# Patient Record
Sex: Female | Born: 1945 | Race: Black or African American | Hispanic: No | Smoking: Current every day smoker
Health system: Southern US, Community
[De-identification: ages and names within clinical notes are randomized; demographics above are authoritative.]

## PROBLEM LIST (undated history)

## (undated) DIAGNOSIS — R413 Other amnesia: Secondary | ICD-10-CM

## (undated) DIAGNOSIS — E079 Disorder of thyroid, unspecified: Secondary | ICD-10-CM

## (undated) DIAGNOSIS — F32A Depression, unspecified: Secondary | ICD-10-CM

## (undated) DIAGNOSIS — E538 Deficiency of other specified B group vitamins: Secondary | ICD-10-CM

## (undated) DIAGNOSIS — E785 Hyperlipidemia, unspecified: Secondary | ICD-10-CM

## (undated) DIAGNOSIS — I10 Essential (primary) hypertension: Secondary | ICD-10-CM

## (undated) DIAGNOSIS — F329 Major depressive disorder, single episode, unspecified: Secondary | ICD-10-CM

## (undated) DIAGNOSIS — N3281 Overactive bladder: Secondary | ICD-10-CM

## (undated) DIAGNOSIS — E119 Type 2 diabetes mellitus without complications: Secondary | ICD-10-CM

## (undated) HISTORY — DX: Deficiency of other specified B group vitamins: E53.8

## (undated) HISTORY — DX: Overactive bladder: N32.81

## (undated) HISTORY — DX: Depression, unspecified: F32.A

## (undated) HISTORY — DX: Other amnesia: R41.3

## (undated) HISTORY — DX: Disorder of thyroid, unspecified: E07.9

## (undated) HISTORY — DX: Hyperlipidemia, unspecified: E78.5

## (undated) HISTORY — PX: ABDOMINAL HYSTERECTOMY: SHX81

---
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
## 2006-02-02 ENCOUNTER — Ambulatory Visit: Payer: Self-pay

## 2006-02-07 ENCOUNTER — Ambulatory Visit: Payer: Self-pay

## 2006-07-21 ENCOUNTER — Ambulatory Visit: Payer: Self-pay

## 2006-10-03 ENCOUNTER — Ambulatory Visit: Payer: Self-pay | Admitting: Family Medicine

## 2007-01-18 ENCOUNTER — Ambulatory Visit: Payer: Self-pay

## 2007-03-10 ENCOUNTER — Ambulatory Visit: Payer: Self-pay

## 2007-07-31 ENCOUNTER — Ambulatory Visit: Payer: Self-pay

## 2007-10-09 ENCOUNTER — Ambulatory Visit: Payer: Self-pay

## 2007-12-05 ENCOUNTER — Emergency Department: Payer: Self-pay | Admitting: Emergency Medicine

## 2008-02-05 ENCOUNTER — Ambulatory Visit: Payer: Self-pay

## 2008-02-06 ENCOUNTER — Ambulatory Visit: Payer: Self-pay

## 2008-03-06 ENCOUNTER — Ambulatory Visit: Payer: Self-pay | Admitting: General Surgery

## 2008-05-24 ENCOUNTER — Ambulatory Visit: Payer: Self-pay | Admitting: Otolaryngology

## 2008-08-29 ENCOUNTER — Ambulatory Visit: Payer: Self-pay

## 2008-12-20 DIAGNOSIS — I10 Essential (primary) hypertension: Secondary | ICD-10-CM | POA: Insufficient documentation

## 2008-12-20 DIAGNOSIS — E039 Hypothyroidism, unspecified: Secondary | ICD-10-CM | POA: Insufficient documentation

## 2011-07-29 DIAGNOSIS — E669 Obesity, unspecified: Secondary | ICD-10-CM | POA: Insufficient documentation

## 2011-07-29 DIAGNOSIS — E66812 Obesity, class 2: Secondary | ICD-10-CM | POA: Insufficient documentation

## 2012-02-07 DIAGNOSIS — B182 Chronic viral hepatitis C: Secondary | ICD-10-CM

## 2012-02-07 HISTORY — DX: Chronic viral hepatitis C: B18.2

## 2014-12-10 DIAGNOSIS — E785 Hyperlipidemia, unspecified: Secondary | ICD-10-CM | POA: Insufficient documentation

## 2015-12-12 DIAGNOSIS — F17200 Nicotine dependence, unspecified, uncomplicated: Secondary | ICD-10-CM | POA: Insufficient documentation

## 2016-04-03 DIAGNOSIS — E876 Hypokalemia: Secondary | ICD-10-CM

## 2016-04-03 DIAGNOSIS — R0902 Hypoxemia: Secondary | ICD-10-CM

## 2016-04-03 DIAGNOSIS — D72829 Elevated white blood cell count, unspecified: Secondary | ICD-10-CM | POA: Diagnosis not present

## 2016-04-03 DIAGNOSIS — J189 Pneumonia, unspecified organism: Secondary | ICD-10-CM

## 2016-04-03 DIAGNOSIS — Z720 Tobacco use: Secondary | ICD-10-CM

## 2016-04-04 DIAGNOSIS — D72829 Elevated white blood cell count, unspecified: Secondary | ICD-10-CM | POA: Diagnosis not present

## 2016-04-04 DIAGNOSIS — Z720 Tobacco use: Secondary | ICD-10-CM | POA: Diagnosis not present

## 2016-04-04 DIAGNOSIS — E876 Hypokalemia: Secondary | ICD-10-CM | POA: Diagnosis not present

## 2016-04-04 DIAGNOSIS — R0902 Hypoxemia: Secondary | ICD-10-CM | POA: Diagnosis not present

## 2016-04-05 DIAGNOSIS — R0902 Hypoxemia: Secondary | ICD-10-CM | POA: Diagnosis not present

## 2016-04-05 DIAGNOSIS — D72829 Elevated white blood cell count, unspecified: Secondary | ICD-10-CM | POA: Diagnosis not present

## 2016-04-05 DIAGNOSIS — E876 Hypokalemia: Secondary | ICD-10-CM | POA: Diagnosis not present

## 2016-04-05 DIAGNOSIS — Z720 Tobacco use: Secondary | ICD-10-CM | POA: Diagnosis not present

## 2016-04-06 DIAGNOSIS — E876 Hypokalemia: Secondary | ICD-10-CM | POA: Diagnosis not present

## 2016-04-06 DIAGNOSIS — Z720 Tobacco use: Secondary | ICD-10-CM | POA: Diagnosis not present

## 2016-04-06 DIAGNOSIS — R918 Other nonspecific abnormal finding of lung field: Secondary | ICD-10-CM

## 2016-04-06 DIAGNOSIS — R0902 Hypoxemia: Secondary | ICD-10-CM | POA: Diagnosis not present

## 2016-04-06 DIAGNOSIS — D72829 Elevated white blood cell count, unspecified: Secondary | ICD-10-CM | POA: Diagnosis not present

## 2016-04-06 DIAGNOSIS — I2699 Other pulmonary embolism without acute cor pulmonale: Secondary | ICD-10-CM

## 2016-04-07 DIAGNOSIS — Z720 Tobacco use: Secondary | ICD-10-CM | POA: Diagnosis not present

## 2016-04-07 DIAGNOSIS — E876 Hypokalemia: Secondary | ICD-10-CM | POA: Diagnosis not present

## 2016-04-07 DIAGNOSIS — J189 Pneumonia, unspecified organism: Secondary | ICD-10-CM

## 2016-04-07 DIAGNOSIS — I2699 Other pulmonary embolism without acute cor pulmonale: Secondary | ICD-10-CM

## 2016-04-07 DIAGNOSIS — R918 Other nonspecific abnormal finding of lung field: Secondary | ICD-10-CM

## 2016-04-07 DIAGNOSIS — R0902 Hypoxemia: Secondary | ICD-10-CM | POA: Diagnosis not present

## 2016-04-07 DIAGNOSIS — D72829 Elevated white blood cell count, unspecified: Secondary | ICD-10-CM | POA: Diagnosis not present

## 2016-04-08 DIAGNOSIS — R918 Other nonspecific abnormal finding of lung field: Secondary | ICD-10-CM | POA: Diagnosis not present

## 2016-04-08 DIAGNOSIS — E876 Hypokalemia: Secondary | ICD-10-CM | POA: Diagnosis not present

## 2016-04-08 DIAGNOSIS — J189 Pneumonia, unspecified organism: Secondary | ICD-10-CM | POA: Diagnosis not present

## 2016-04-08 DIAGNOSIS — R0902 Hypoxemia: Secondary | ICD-10-CM

## 2016-04-08 DIAGNOSIS — Z720 Tobacco use: Secondary | ICD-10-CM | POA: Diagnosis not present

## 2016-04-08 DIAGNOSIS — I2699 Other pulmonary embolism without acute cor pulmonale: Secondary | ICD-10-CM | POA: Diagnosis not present

## 2016-04-08 DIAGNOSIS — D72829 Elevated white blood cell count, unspecified: Secondary | ICD-10-CM | POA: Diagnosis not present

## 2018-11-22 DIAGNOSIS — K219 Gastro-esophageal reflux disease without esophagitis: Secondary | ICD-10-CM | POA: Insufficient documentation

## 2019-03-31 ENCOUNTER — Ambulatory Visit: Payer: Medicare Other

## 2019-03-31 ENCOUNTER — Ambulatory Visit
Admission: EM | Admit: 2019-03-31 | Discharge: 2019-03-31 | Disposition: A | Discharge status: Home / Self Care | Payer: Medicare Other | Attending: Diagnostic Radiology | Admitting: Diagnostic Radiology

## 2019-03-31 ENCOUNTER — Other Ambulatory Visit: Payer: Self-pay

## 2019-03-31 ENCOUNTER — Encounter: Payer: Self-pay | Admitting: Emergency Medicine

## 2019-03-31 DIAGNOSIS — S20212A Contusion of left front wall of thorax, initial encounter: Secondary | ICD-10-CM | POA: Diagnosis not present

## 2019-03-31 DIAGNOSIS — W19XXXA Unspecified fall, initial encounter: Secondary | ICD-10-CM | POA: Diagnosis not present

## 2019-03-31 DIAGNOSIS — S63502A Unspecified sprain of left wrist, initial encounter: Secondary | ICD-10-CM | POA: Insufficient documentation

## 2019-03-31 HISTORY — DX: Type 2 diabetes mellitus without complications: E11.9

## 2019-03-31 HISTORY — DX: Essential (primary) hypertension: I10

## 2019-03-31 NOTE — ED Triage Notes (Signed)
Pt c/o left wrist pain and left sided rib pain. She states that she fell about 2 days ago. She fell on a box. She states that she just lost her balance and fell.

## 2019-03-31 NOTE — Discharge Instructions (Addendum)
Take Tylenol 500 mg every 6h for pain as needed Follow with orthopedic through [facility] on Monday, call them or just go there since they are an urgent care clinic.

## 2019-03-31 NOTE — ED Provider Notes (Signed)
[facility]    CSN: [id number] Arrival date & time: 03/31/19  1129      History   Chief Complaint Chief Complaint  Patient presents with  . Fall    HPI Katherine Stevens is a 73 y.o. female. who presents with L wrist pain and L anterior rL rib pain after tripping and falling forward and a wood box hit her L rib area anteriorly. She fell forward. No LOC. Pain is located on medial L wrist and is worse with palpation, some movements and cant lean on it. Ribs are tender with deep breaths and palpation. States she needs to find a Dr In [place] since [facility] go [facility]. Her niece who is here with her would also like pt referred for memory evaluation. She does not know how pt's insurance works and pt does not have a new PCP.     Past Medical History:  Diagnosis Date  . Diabetes mellitus without complication (HCC)   . Hypertension     There are no problems to display for this patient.   Past Surgical History:  Procedure Laterality Date  . ABDOMINAL HYSTERECTOMY      OB History   No obstetric history on file.      Home Medications    Prior to Admission medications   Medication Sig Start Date End Date Taking? Authorizing Provider  atorvastatin (LIPITOR) 40 MG tablet Take by mouth. 11/23/18 11/23/19 Yes [provider]  buPROPion (WELLBUTRIN) 75 MG tablet Take by mouth. 11/23/18 11/23/19 Yes [provider]  citalopram (CELEXA) 10 MG tablet Take by mouth. 11/23/18 11/23/19 Yes [provider]  hydrochlorothiazide (HYDRODIURIL) 25 MG tablet Take 25 mg by mouth daily.   Yes [provider]  levothyroxine (SYNTHROID) 75 MCG tablet Take by mouth. 03/13/19  Yes [provider]  loratadine (CLARITIN) 10 MG tablet Take by mouth. 11/23/18  Yes [provider]  losartan (COZAAR) 50 MG tablet Take by mouth. 11/23/18 11/23/19 Yes [provider]  metFORMIN (GLUCOPHAGE) 500 MG tablet Take by mouth.  11/23/18  Yes [provider]  oxybutynin (DITROPAN-XL) 5 MG 24 hr tablet Take by mouth. 11/23/18 11/23/19 Yes [provider]    Family History History reviewed. No pertinent family history.  Social History Social History   Tobacco Use  . Smoking status: Current Every Day Smoker    Packs/day: 0.25  . Smokeless tobacco: Never Used  Substance Use Topics  . Alcohol use: Not Currently  . Drug use: Not Currently     Allergies   Lisinopril   Review of Systems Review of Systems + L anterior L rib pain, L medial wrist pain, denies CP, SOB, edema, N/V, has urinary frequency and is on medication for this which she does not take all the time. Has been feeling little off balance off and on, but the day she fell was due to tripping on the box. Denies rhinitis, ear pain, cough, or ST.   Physical Exam Triage Vital Signs ED Triage Vitals  Enc Vitals Group     BP 03/31/19 1154 (!) 156/81     Pulse Rate 03/31/19 1154 72     Resp 03/31/19 1154 18     Temp 03/31/19 1154 98.6 F (37 C)     Temp Source 03/31/19 1154 Oral     SpO2 03/31/19 1154 96 %     Weight 03/31/19 1146 240 lb (108.9 kg)     Height 03/31/19 1146 5' 6.5" (1.689  m)     Head Circumference --      Peak Flow --      Pain Score 03/31/19 1145 3     Pain Loc --      Pain Edu? --      Excl. in [place]? --    No data found.  Updated Vital Signs BP (!) 156/81 (BP Location: Left Arm)   Pulse 72   Temp 98.6 F (37 C) (Oral)   Resp 18   Ht 5' 6.5" (1.689 m)   Wt 240 lb (108.9 kg)   SpO2 96%   BMI 38.16 kg/m   Visual Acuity Right Eye Distance:   Left Eye Distance:   Bilateral Distance:    Right Eye Near:   Left Eye Near:    Bilateral Near:     Physical Exam Vitals and nursing note reviewed.  Constitutional:      General: She is not in acute distress.    Appearance: She is obese. She is not toxic-appearing.  HENT:     Head: Atraumatic.     Right Ear: External ear normal.     Left Ear: External  ear normal.  Eyes:     General: No scleral icterus.    Extraocular Movements: Extraocular movements intact.     Conjunctiva/sclera: Conjunctivae normal.     Pupils: Pupils are equal, round, and reactive to light.  Cardiovascular:     Rate and Rhythm: Normal rate and regular rhythm.     Heart sounds: No murmur.  Pulmonary:     Effort: Pulmonary effort is normal.     Breath sounds: Normal breath sounds.     Comments: On L mid rib right below her breast. No crepitations noted.  Chest:     Chest wall: Tenderness present.  Abdominal:     General: Bowel sounds are normal. There is no distension.     Palpations: Abdomen is soft. There is no mass.     Tenderness: There is no abdominal tenderness. There is no guarding or rebound.  Musculoskeletal:        General: Swelling and tenderness present. No deformity.     Cervical back: Neck supple.     Comments: L WRIST- with moderate swelling on medial region, has decreased ROM due to pain. Has + snuff box tenderness and distal radial tenderness. Radial and ulnar pulses are intact.   Skin:    General: Skin is warm and dry.     Findings: No bruising, erythema, lesion or rash.  Neurological:     Mental Status: She is alert and oriented to person, place, and time.     Motor: No weakness.     Gait: Gait normal.  Psychiatric:        Mood and Affect: Mood normal.        Behavior: Behavior normal.        Thought Content: Thought content normal.        Judgment: Judgment normal.    UC Treatments / Results  Labs (all labs ordered are listed, but only abnormal results are displayed) Labs Reviewed - No data to display  EKG   Radiology DG Ribs Unilateral W/Chest Left  Result Date: 03/31/2019 CLINICAL DATA:  Status post fall 2 days ago with left rib pain. EXAM: LEFT RIBS AND CHEST - 3+ VIEW COMPARISON:  October 09 2007 FINDINGS: No fracture or other bone lesions are seen involving the ribs. There is no evidence of pneumothorax or pleural effusion.  There is  mild atelectasis or scar of left lung base. The lungs are otherwise clear. Heart size and mediastinal contours are within normal limits. IMPRESSION: No acute fracture or dislocation of left ribs. Electronically Signed   By: Sherian Rein M.D.   On: 03/31/2019 13:02   DG Wrist Complete Left  Result Date: 03/31/2019 CLINICAL DATA:  Status post fall 2 days ago with left wrist pain. EXAM: LEFT WRIST - COMPLETE 3+ VIEW COMPARISON:  None. FINDINGS: There is no evidence of fracture or dislocation. Soft tissues are unremarkable. IMPRESSION: No acute fracture or dislocation. Electronically Signed   By: Sherian Rein M.D.   On: 03/31/2019 13:00    Procedures Procedures (including critical care time)  Medications Ordered in UC Medications - No data to display  Initial Impression / Assessment and Plan / UC Course  I have reviewed the triage vital signs and the nursing notes. Pertinent  imaging results that were available during my care of the patient were reviewed by me and considered in my medical decision making (see chart for details). She may take Tylenol prn pain. Advised to use ice on areas of pain.  Needs to FU with ortho since her scaphoid is tender. She was placed on a thumb spica splint.  She was given information to a neurologist and ortho. Her niece knows where [facility] located who are taking new pts, so pt can get established with a PCP.    Final Clinical Impressions(s) / UC Diagnoses   Final diagnoses:  Rib contusion, left, initial encounter  Sprain of left wrist, initial encounter     Discharge Instructions     Take Tylenol 500 mg every 6h for pain as needed Follow with orthopedic through [facility] on Monday, call them or just go there since they are an urgent care clinic.     ED Prescriptions    None     PDMP not reviewed this encounter.   Garey Ham, PA-C 03/31/19 1404

## 2019-04-09 ENCOUNTER — Other Ambulatory Visit: Payer: Self-pay | Admitting: Neurology

## 2019-04-09 DIAGNOSIS — F0280 Dementia in other diseases classified elsewhere without behavioral disturbance: Secondary | ICD-10-CM

## 2019-04-16 ENCOUNTER — Ambulatory Visit (INDEPENDENT_AMBULATORY_CARE_PROVIDER_SITE_OTHER): Payer: Medicare Other | Admitting: Family Medicine

## 2019-04-16 ENCOUNTER — Other Ambulatory Visit: Payer: Self-pay

## 2019-04-16 ENCOUNTER — Encounter: Payer: Self-pay | Admitting: Family Medicine

## 2019-04-16 VITALS — BP 134/72 | HR 88 | Temp 97.1°F | Resp 14 | Ht 66.5 in | Wt 223.1 lb

## 2019-04-16 DIAGNOSIS — E089 Diabetes mellitus due to underlying condition without complications: Secondary | ICD-10-CM | POA: Diagnosis not present

## 2019-04-16 DIAGNOSIS — F0150 Vascular dementia without behavioral disturbance: Secondary | ICD-10-CM

## 2019-04-16 DIAGNOSIS — R42 Dizziness and giddiness: Secondary | ICD-10-CM | POA: Insufficient documentation

## 2019-04-16 DIAGNOSIS — G309 Alzheimer's disease, unspecified: Secondary | ICD-10-CM

## 2019-04-16 DIAGNOSIS — E119 Type 2 diabetes mellitus without complications: Secondary | ICD-10-CM | POA: Diagnosis not present

## 2019-04-16 DIAGNOSIS — I10 Essential (primary) hypertension: Secondary | ICD-10-CM

## 2019-04-16 DIAGNOSIS — Z7689 Persons encountering health services in other specified circumstances: Secondary | ICD-10-CM

## 2019-04-16 DIAGNOSIS — E039 Hypothyroidism, unspecified: Secondary | ICD-10-CM

## 2019-04-16 DIAGNOSIS — E785 Hyperlipidemia, unspecified: Secondary | ICD-10-CM

## 2019-04-16 DIAGNOSIS — E519 Thiamine deficiency, unspecified: Secondary | ICD-10-CM

## 2019-04-16 DIAGNOSIS — Z5181 Encounter for therapeutic drug level monitoring: Secondary | ICD-10-CM

## 2019-04-16 DIAGNOSIS — R188 Other ascites: Secondary | ICD-10-CM

## 2019-04-16 DIAGNOSIS — F320 Major depressive disorder, single episode, mild: Secondary | ICD-10-CM

## 2019-04-16 DIAGNOSIS — K769 Liver disease, unspecified: Secondary | ICD-10-CM

## 2019-04-16 DIAGNOSIS — K7460 Unspecified cirrhosis of liver: Secondary | ICD-10-CM | POA: Insufficient documentation

## 2019-04-16 DIAGNOSIS — F17200 Nicotine dependence, unspecified, uncomplicated: Secondary | ICD-10-CM

## 2019-04-16 DIAGNOSIS — J302 Other seasonal allergic rhinitis: Secondary | ICD-10-CM

## 2019-04-16 DIAGNOSIS — E538 Deficiency of other specified B group vitamins: Secondary | ICD-10-CM

## 2019-04-16 DIAGNOSIS — F0280 Dementia in other diseases classified elsewhere without behavioral disturbance: Secondary | ICD-10-CM

## 2019-04-16 HISTORY — DX: Dizziness and giddiness: R42

## 2019-04-16 NOTE — Patient Instructions (Addendum)
Thyroid meds need to be in the morning before you eat breakfast   Other morning meds can be taken with breakfast  Metformin  Losartan  Hydrochlorothiazide  Wellbutrin    Meds that can be taken at dinnertime  Metformin  Meds that should be taken at bedtime  Lipitor   Aricept  Claritin  Celexa

## 2019-04-16 NOTE — Progress Notes (Signed)
Name: Shonteria Abeln   MRN: [id number]    DOB: [birth year]   Date:04/16/2019       Progress Note  Chief Complaint  Patient presents with  . Dizziness    going on for several months  . Diabetes     Subjective:   Ivyonna Hoelzel is a 74 y.o. female, presents to clinic for routine follow up on the conditions listed above.  She is new to establish care here, was going to PCP in [facility] Sister is with her, Kathie Rhodes she is helping with her care to keep track of things. She lives in [place], was seeing several specialists previously   She was seeing GI at [facility] - for cirrhosis,   HLD:  On lipitor 40 mg - her sister helps manage her meds, compliant, no myalgias DM:  On metformin 1000 mg BID -patient states she is sometimes taking less, not checking her blood sugar Last A1c Hypoothyroid on 75 mcg levothyroxine, last labs through care everywhere [facility] TSH was elevated  05/24/2018 TSH 18.100 10/25/2018 TSH 3.571 Pt and her sister are not sure how or when she is taking her levothyroxin, they are concerned about her cognitive function and memory and the pt is adamant that she is fine and she arranged her pills in her pill box before her family could do it and her son is so worried about her he is checking on her every hour.    Neurology recently saw her and referred to [facility] eval.      Patient Active Problem List   Diagnosis Date Noted  . Current mild episode of major depressive disorder (HCC) 04/16/2019  . Type 2 diabetes mellitus without complication, without long-term current use of insulin (HCC) 04/16/2019  . Hypothyroidism 04/16/2019  . Hyperlipidemia 04/16/2019  . Cirrhosis of liver with ascites (HCC) 04/16/2019  . Vertigo 04/16/2019  . 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 Folate deficiency 04/16/2019  . Vitamin B1 deficiency 04/16/2019  . Tobacco dependence 12/12/2015  . Osteoarthritis of both knees 08/01/2012  . Bladder spasm 07/28/2012  .  Seasonal allergies 07/28/2012  . Chronic hepatitis C without hepatic coma (HCC) 02/07/2012  . Obesity 07/29/2011    Past Surgical History:  Procedure Laterality Date  . ABDOMINAL HYSTERECTOMY      Family History  Problem Relation Age of Onset  . Heart failure Mother   . Diabetes Mother   . Diabetes Sister   . Hyperlipidemia Sister   . Thyroid disease Sister   . Stroke Brother   . Heart attack Brother   . Cancer Maternal Grandmother   . Heart attack Brother     Social History   Socioeconomic History  . Marital status: Divorced    Spouse name: Not on file  . Number of children: Not on file  . Years of education: Not on file  . Highest education level: Not on file  Occupational History  . Not on file  Tobacco Use  . Smoking status: Current Every Day Smoker    Packs/day: 0.25    Types: Cigarettes  . Smokeless tobacco: Never Used  . Tobacco comment: 6-7 cigarettes a day  Substance and Sexual Activity  . Alcohol use: Not Currently  . Drug use: Not Currently  . Sexual activity: Not Currently  Other Topics Concern  . Not on file  Social History Narrative  . Not on file   Social Determinants of Health   Financial Resource Strain:   . Difficulty  of Paying Living Expenses: Not on file  Food Insecurity:   . Worried About Programme researcher, broadcasting/film/video in the Last Year: Not on file  . Ran Out of Food in the Last Year: Not on file  Transportation Needs:   . Lack of Transportation (Medical): Not on file  . Lack of Transportation (Non-Medical): Not on file  Physical Activity:   . Days of Exercise per Week: Not on file  . Minutes of Exercise per Session: Not on file  Stress:   . Feeling of Stress : Not on file  Social Connections:   . Frequency of Communication with Friends and Family: Not on file  . Frequency of Social Gatherings with Friends and Family: Not on file  . Attends Religious Services: Not on file  . Active Member of Clubs or Organizations: Not on file  . Attends  Banker Meetings: Not on file  . Marital Status: Not on file  Intimate Partner Violence:   . Fear of Current or Ex-Partner: Not on file  . Emotionally Abused: Not on file  . Physically Abused: Not on file  . Sexually Abused: Not on file     Current Outpatient Medications:  .  atorvastatin (LIPITOR) 40 MG tablet, Take 40 mg by mouth at bedtime. , Disp: , Rfl:  .  buPROPion (WELLBUTRIN) 75 MG tablet, Take 75 mg by mouth 2 (two) times daily. 2tabs bid, Disp: , Rfl:  .  citalopram (CELEXA) 10 MG tablet, Take 10 mg by mouth daily. , Disp: , Rfl:  .  donepezil (ARICEPT) 5 MG tablet, Take by mouth., Disp: , Rfl:  .  hydrochlorothiazide (HYDRODIURIL) 25 MG tablet, Take 25 mg by mouth daily., Disp: , Rfl:  .  levothyroxine (SYNTHROID) 75 MCG tablet, Take 75 mcg by mouth daily before breakfast. , Disp: , Rfl:  .  loratadine (CLARITIN) 10 MG tablet, Take 10 mg by mouth daily. , Disp: , Rfl:  .  losartan (COZAAR) 50 MG tablet, Take 50 mg by mouth 2 (two) times daily. , Disp: , Rfl:  .  metFORMIN (GLUCOPHAGE) 500 MG tablet, Take 500 mg by mouth 2 (two) times daily with a meal. , Disp: , Rfl:  .  oxybutynin (DITROPAN-XL) 5 MG 24 hr tablet, Take 5 mg by mouth at bedtime. , Disp: , Rfl:  .  vitamin B-12 (CYANOCOBALAMIN) 500 MCG tablet, Take 500 mcg by mouth 2 (two) times daily., Disp: , Rfl:   Allergies  Allergen Reactions  . Lisinopril Anaphylaxis    MOUTH & JAW SWELLING    Chart Review Today: I personally reviewed active problem list, medication list, allergies, family history, social history, health maintenance, notes from last encounter, lab results, imaging with the patient/caregiver today.  Last labs in [facility] and [place], last several visits   Review of Systems  Constitutional: Negative.   HENT: Negative.   Eyes: Negative.   Respiratory: Negative.   Cardiovascular: Negative.   Gastrointestinal: Negative.   Endocrine: Negative.   Genitourinary: Negative.   Musculoskeletal:  Negative.   Skin: Negative.   Allergic/Immunologic: Negative.   Neurological: Negative.   Hematological: Negative.   Psychiatric/Behavioral: Negative.   All other systems reviewed and are negative.    Objective:    Vitals:   04/16/19 1101  BP: 134/72  Pulse: 88  Resp: 14  Temp: (!) 97.1 F (36.2 C)  TempSrc: Temporal  SpO2: 97%  Weight: 223 lb 1.6 oz (101.2 kg)  Height: 5' 6.5" (1.689 m)  Body mass index is 35.47 kg/m.  Physical Exam Vitals and nursing note reviewed.  Constitutional:      General: She is not in acute distress.    Appearance: Normal appearance. She is well-developed. She is not ill-appearing, toxic-appearing or diaphoretic.     Interventions: Face mask in place.  HENT:     Head: Normocephalic and atraumatic.     Right Ear: External ear normal.     Left Ear: External ear normal.  Eyes:     General: Lids are normal. No scleral icterus.       Right eye: No discharge.        Left eye: No discharge.     Conjunctiva/sclera: Conjunctivae normal.  Neck:     Trachea: Phonation normal. No tracheal deviation.  Cardiovascular:     Rate and Rhythm: Normal rate and regular rhythm.     Pulses: Normal pulses.          Radial pulses are 2+ on the right side and 2+ on the left side.       Posterior tibial pulses are 2+ on the right side and 2+ on the left side.     Heart sounds: Normal heart sounds. No murmur. No friction rub. No gallop.   Pulmonary:     Effort: Pulmonary effort is normal. No respiratory distress.     Breath sounds: Normal breath sounds. No stridor. No wheezing, rhonchi or rales.  Chest:     Chest wall: No tenderness.  Abdominal:     General: Bowel sounds are normal. There is no distension.     Palpations: Abdomen is soft.     Tenderness: There is no abdominal tenderness. There is no guarding or rebound.  Musculoskeletal:        General: No deformity. Normal range of motion.     Cervical back: Normal range of motion and neck supple.      Right lower leg: No edema.     Left lower leg: No edema.  Lymphadenopathy:     Cervical: No cervical adenopathy.  Skin:    General: Skin is warm and dry.     Capillary Refill: Capillary refill takes less than 2 seconds.     Coloration: Skin is not jaundiced or pale.     Findings: No rash.  Neurological:     Mental Status: She is alert and oriented to person, place, and time.     Motor: No abnormal muscle tone.     Gait: Gait normal.  Psychiatric:        Speech: Speech normal.        Behavior: Behavior normal.       Diabetic Foot Exam: Diabetic Foot Exam - Simple   No data filed      PHQ2/9: Depression screen PHQ 2/9 04/16/2019  Decreased Interest 1  Down, Depressed, Hopeless 0  PHQ - 2 Score 1  Altered sleeping 0  Tired, decreased energy 0  Change in appetite 0  Feeling bad or failure about yourself  0  Trouble concentrating 2  Moving slowly or fidgety/restless 2  Suicidal thoughts 0  PHQ-9 Score 5  Difficult doing work/chores Not difficult at all    phq 9 is positive   Fall Risk: Fall Risk  04/16/2019  Falls in the past year? 1  Number falls in past yr: 0  Injury with Fall? 1  Comment left rib, was xrayed    Functional Status Survey: Is the patient deaf or have difficulty hearing?: No  Does the patient have difficulty seeing, even when wearing glasses/contacts?: Yes Does the patient have difficulty concentrating, remembering, or making decisions?: Yes Does the patient have difficulty walking or climbing stairs?: Yes Does the patient have difficulty dressing or bathing?: No Does the patient have difficulty doing errands alone such as visiting a doctor's office or shopping?: Yes   Assessment & Plan:     ICD-10-CM   1. Hypothyroidism, unspecified type  E03.9 CBC w/ Diff    CMP w GFR    TSH   last labs TSH elevated, reviewed administration today, checking TSH  2. Type 2 diabetes mellitus without complication, without long-term current use of insulin (HCC)   E11.9 CMP w GFR    Microalbumin, urine  3. Diabetes mellitus due to underlying condition without complication, without long-term current use of insulin (HCC)  E08.9 CMP w GFR    Lipid Panel    A1C    Microalbumin, urine   recheck meds, has been well controlled  4. Hyperlipidemia, unspecified hyperlipidemia type  E78.5 CMP w GFR    Lipid Panel   check labs, compliant with meds  5. Hypertension, unspecified type  I10 CMP w GFR   stable, well controlled, reviewed meds today, check labs  6. Current mild episode of major depressive disorder, unspecified whether recurrent ([place])  F32.0   7. Mixed Alzheimer's and vascular dementia ([place])  G30.9 donepezil (ARICEPT) 5 MG tablet   F01.50    F02.80    per neuro  8. B12 deficiency  E53.8 CBC w/ Diff  9. Vitamin B1 deficiency  E51.9 CBC w/ Diff  10. Folate deficiency  E53.8 CBC w/ Diff  11. Vertigo  R42   12. Cirrhosis of liver with ascites, unspecified hepatic cirrhosis type ([place])  K74.60 CMP w GFR   R18.8 Ambulatory referral to Gastroenterology  13. Hepatic lesion  K76.9 CMP w GFR    Ambulatory referral to Gastroenterology  14. Seasonal allergic rhinitis, unspecified trigger  J30.2   15. Tobacco dependence  F17.200   16. Encounter to establish care with new doctor  Z76.89    reviewed records that were available, discussed med list with pt and her sister  63. Encounter for medication monitoring  Z51.81 CBC w/ Diff    CMP w GFR    Lipid Panel    A1C    TSH    Microalbumin, urine   Needs HH eval?  Pt seems very alert and orieted to me today.   Pt new will need to review past PCP and specialist visits at length to make sure our chart and med list is correct. Will need to est with specialists locally   Pts sister is going to her home to try and verify meds in her pill box, make sure she is taking levothyroxine in am prior to breakfast, other morning meds with breakfast  Return for 1 month to continue to address chronic conditions.   Delsa Grana, PA-C 04/16/19 12:32 PM

## 2019-04-17 LAB — HEMOGLOBIN A1C
Hgb A1c MFr Bld: 6.1 %{Hb} — ABNORMAL HIGH
Mean Plasma Glucose: 128 (calc)
eAG (mmol/L): 7.1 (calc)

## 2019-04-17 LAB — COMPLETE METABOLIC PANEL WITHOUT GFR
AG Ratio: 1.2 (calc) (ref 1.0–2.5)
ALT: 14 U/L (ref 6–29)
AST: 19 U/L (ref 10–35)
Albumin: 4.5 g/dL (ref 3.6–5.1)
Alkaline phosphatase (APISO): 114 U/L (ref 37–153)
BUN/Creatinine Ratio: 11 (calc) (ref 6–22)
BUN: 12 mg/dL (ref 7–25)
CO2: 26 mmol/L (ref 20–32)
Calcium: 10 mg/dL (ref 8.6–10.4)
Chloride: 98 mmol/L (ref 98–110)
Creat: 1.06 mg/dL — ABNORMAL HIGH (ref 0.60–0.93)
GFR, Est African American: 60 mL/min/1.73m2
GFR, Est Non African American: 52 mL/min/1.73m2 — ABNORMAL LOW
Globulin: 3.7 g/dL (ref 1.9–3.7)
Glucose, Bld: 124 mg/dL — ABNORMAL HIGH (ref 65–99)
Potassium: 4.2 mmol/L (ref 3.5–5.3)
Sodium: 135 mmol/L (ref 135–146)
Total Bilirubin: 0.6 mg/dL (ref 0.2–1.2)
Total Protein: 8.2 g/dL — ABNORMAL HIGH (ref 6.1–8.1)

## 2019-04-17 LAB — LIPID PANEL
Cholesterol: 125 mg/dL
HDL: 68 mg/dL
LDL Cholesterol (Calc): 43 mg/dL
Non-HDL Cholesterol (Calc): 57 mg/dL
Total CHOL/HDL Ratio: 1.8 (calc)
Triglycerides: 68 mg/dL

## 2019-04-17 LAB — CBC WITH DIFFERENTIAL/PLATELET
Absolute Monocytes: 564 {cells}/uL (ref 200–950)
Basophils Absolute: 59 {cells}/uL (ref 0–200)
Basophils Relative: 0.6 %
Eosinophils Absolute: 109 {cells}/uL (ref 15–500)
Eosinophils Relative: 1.1 %
HCT: 42.9 % (ref 35.0–45.0)
Hemoglobin: 14.4 g/dL (ref 11.7–15.5)
Lymphs Abs: 2861 {cells}/uL (ref 850–3900)
MCH: 29.6 pg (ref 27.0–33.0)
MCHC: 33.6 g/dL (ref 32.0–36.0)
MCV: 88.3 fL (ref 80.0–100.0)
MPV: 9.6 fL (ref 7.5–12.5)
Monocytes Relative: 5.7 %
Neutro Abs: 6306 {cells}/uL (ref 1500–7800)
Neutrophils Relative %: 63.7 %
Platelets: 300 Thousand/uL (ref 140–400)
RBC: 4.86 Million/uL (ref 3.80–5.10)
RDW: 13.7 % (ref 11.0–15.0)
Total Lymphocyte: 28.9 %
WBC: 9.9 Thousand/uL (ref 3.8–10.8)

## 2019-04-17 LAB — TSH: TSH: 41.76 m[IU]/L — ABNORMAL HIGH (ref 0.40–4.50)

## 2019-04-18 LAB — MICROALBUMIN, URINE: Microalb, Ur: 3.6 mg/dL

## 2019-04-19 ENCOUNTER — Ambulatory Visit (HOSPITAL_COMMUNITY)
Admission: RE | Admit: 2019-04-19 | Discharge: 2019-04-19 | Disposition: A | Discharge status: Home / Self Care | Payer: Medicare Other | Source: Ambulatory Visit | Attending: Neurology | Admitting: Neurology

## 2019-04-19 ENCOUNTER — Other Ambulatory Visit: Payer: Self-pay

## 2019-04-19 ENCOUNTER — Encounter: Payer: Self-pay | Admitting: Family Medicine

## 2019-04-19 DIAGNOSIS — G309 Alzheimer's disease, unspecified: Secondary | ICD-10-CM | POA: Insufficient documentation

## 2019-04-19 DIAGNOSIS — F0280 Dementia in other diseases classified elsewhere without behavioral disturbance: Secondary | ICD-10-CM | POA: Diagnosis present

## 2019-04-20 ENCOUNTER — Ambulatory Visit
Admission: EM | Admit: 2019-04-20 | Discharge: 2019-04-20 | Disposition: A | Discharge status: Home / Self Care | Payer: Medicare Other | Attending: Family Medicine | Admitting: Family Medicine

## 2019-04-20 DIAGNOSIS — R1111 Vomiting without nausea: Secondary | ICD-10-CM | POA: Insufficient documentation

## 2019-04-20 DIAGNOSIS — E871 Hypo-osmolality and hyponatremia: Secondary | ICD-10-CM | POA: Diagnosis present

## 2019-04-20 DIAGNOSIS — R112 Nausea with vomiting, unspecified: Secondary | ICD-10-CM | POA: Diagnosis not present

## 2019-04-20 DIAGNOSIS — Z87898 Personal history of other specified conditions: Secondary | ICD-10-CM

## 2019-04-20 LAB — BASIC METABOLIC PANEL
Anion gap: 12 (ref 5–15)
BUN: 19 mg/dL (ref 8–23)
CO2: 24 mmol/L (ref 22–32)
Calcium: 10 mg/dL (ref 8.9–10.3)
Chloride: 95 mmol/L — ABNORMAL LOW (ref 98–111)
Creatinine, Ser: 1.48 mg/dL — ABNORMAL HIGH (ref 0.44–1.00)
GFR calc Af Amer: 40 mL/min — ABNORMAL LOW (ref >60)
GFR calc non Af Amer: 35 mL/min — ABNORMAL LOW (ref >60)
Glucose, Bld: 112 mg/dL — ABNORMAL HIGH (ref 70–99)
Potassium: 4.7 mmol/L (ref 3.5–5.1)
Sodium: 131 mmol/L — ABNORMAL LOW (ref 135–145)

## 2019-04-20 MED ORDER — ONDANSETRON 4 MG PO TBDP: 4.0000 mg | ORAL_TABLET | Freq: Three times a day (TID) | ORAL | 0 refills | Status: DC | PRN

## 2019-04-20 NOTE — ED Triage Notes (Signed)
Pt presents with c/o nausea, onset yesterday, 2 bouts of emesis and 3 falls in the home. She reports she did hit the back of head on the wall and cracked the sheet rock, she denies LOC, wound or headache post fall. She reports she does feel some improved today. She denies fever, diarrhea, cough/shob or other symptoms. She has not had any emesis since last night. She does have loss of appetite today.

## 2019-04-20 NOTE — Discharge Instructions (Addendum)
Increase water/electrolyte intake  Follow up with primary care provider next week to recheck blood tests/labs

## 2019-04-25 NOTE — ED Provider Notes (Signed)
[facility]    CSN: [id number] Arrival date & time: 04/20/19  1117      History   Chief Complaint Chief Complaint  Patient presents with  . Emesis  . Fall    HPI Katherine Stevens is a 74 y.o. female.   74 yo female with a c/o nausea and vomiting since yesterday. States she's vomited twice. Denies any fevers, chills, abdominal pain, diarrhea. States she has a h/o dizziness and has fallen several times at home recently but denies hitting her head or loss of consciousness.   Emesis Fall    Past Medical History:  Diagnosis Date  . B12 deficiency   . Depression   . Diabetes mellitus without complication (HCC)   . Hyperlipidemia   . Hypertension   . Memory loss   . Overactive bladder   . Thyroid disease     Patient Active Problem List   Diagnosis Date Noted  . Current mild episode of major depressive disorder (HCC) 04/16/2019  . Type 2 diabetes mellitus without complication, without long-term current use of insulin (HCC) 04/16/2019  . Hypothyroidism 04/16/2019  . Hyperlipidemia 04/16/2019  . Cirrhosis of liver with ascites (HCC) 04/16/2019  . Vertigo 04/16/2019  . 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 Folate deficiency 04/16/2019  . Vitamin B1 deficiency 04/16/2019  . Tobacco dependence 12/12/2015  . Osteoarthritis of both knees 08/01/2012  . Bladder spasm 07/28/2012  . Seasonal allergies 07/28/2012  . Chronic hepatitis C without hepatic coma (HCC) 02/07/2012  . Obesity 07/29/2011    Past Surgical History:  Procedure Laterality Date  . ABDOMINAL HYSTERECTOMY      OB History   No obstetric history on file.      Home Medications    Prior to Admission medications   Medication Sig Start Date End Date Taking? Authorizing Provider  atorvastatin (LIPITOR) 40 MG tablet Take 40 mg by mouth at bedtime.  11/23/18 11/23/19 Yes [provider]  buPROPion (WELLBUTRIN) 75 MG tablet Take 75 mg by mouth 2 (two) times daily.  2tabs bid 11/23/18 11/23/19 Yes [provider]  citalopram (CELEXA) 10 MG tablet Take 10 mg by mouth daily.  11/23/18 11/23/19 Yes [provider]  donepezil (ARICEPT) 5 MG tablet Take by mouth. 04/04/19 05/04/19 Yes [provider]  hydrochlorothiazide (HYDRODIURIL) 25 MG tablet Take 25 mg by mouth daily.   Yes [provider]  levothyroxine (SYNTHROID) 75 MCG tablet Take 75 mcg by mouth daily before breakfast.  03/13/19  Yes [provider]  loratadine (CLARITIN) 10 MG tablet Take 10 mg by mouth daily.  11/23/18  Yes [provider]  losartan (COZAAR) 50 MG tablet Take 50 mg by mouth 2 (two) times daily.  11/23/18 11/23/19 Yes [provider]  metFORMIN (GLUCOPHAGE) 500 MG tablet Take 500 mg by mouth 2 (two) times daily with a meal.  11/23/18  Yes [provider]  oxybutynin (DITROPAN-XL) 5 MG 24 hr tablet Take 5 mg by mouth at bedtime.  11/23/18 11/23/19 Yes [provider]  vitamin B-12 (CYANOCOBALAMIN) 500 MCG tablet Take 500 mcg by mouth 2 (two) times daily.   Yes [provider]  ondansetron (ZOFRAN ODT) 4 MG disintegrating tablet Take 1 tablet (4 mg total) by mouth every 8 (eight) hours as needed for nausea or vomiting. 04/20/19   Payton Mccallum, MD    Family History Family History  Problem Relation Age of Onset  . Heart failure Mother   . Diabetes Mother   .  Diabetes Sister   . Hyperlipidemia Sister   . Thyroid disease Sister   . Stroke Brother   . Heart attack Brother   . Cancer Maternal Grandmother   . Heart attack Brother     Social History Social History   Tobacco Use  . Smoking status: Current Every Day Smoker    Packs/day: 0.25    Types: Cigarettes  . Smokeless tobacco: Never Used  . Tobacco comment: 6-7 cigarettes a day  Substance Use Topics  . Alcohol use: Not Currently  . Drug use: Not Currently     Allergies   Lisinopril   Review of Systems Review of Systems    Gastrointestinal: Positive for vomiting.     Physical Exam Triage Vital Signs ED Triage Vitals  Enc Vitals Group     BP 04/20/19 1149 118/65     Pulse Rate 04/20/19 1149 74     Resp --      Temp 04/20/19 1149 98.7 F (37.1 C)     Temp Source 04/20/19 1149 Oral     SpO2 04/20/19 1149 95 %     Weight 04/20/19 1147 223 lb (101.2 kg)     Height 04/20/19 1147 5' 6.5" (1.689 m)     Head Circumference --      Peak Flow --      Pain Score 04/20/19 1147 0     Pain Loc --      Pain Edu? --      Excl. in GC? --    No data found.  Updated Vital Signs BP 118/65 (BP Location: Left Arm)   Pulse 74   Temp 98.7 F (37.1 C) (Oral)   Ht 5' 6.5" (1.689 m)   Wt 101.2 kg   SpO2 95%   BMI 35.45 kg/m   Visual Acuity Right Eye Distance:   Left Eye Distance:   Bilateral Distance:    Right Eye Near:   Left Eye Near:    Bilateral Near:     Physical Exam Vitals and nursing note reviewed.  Constitutional:      General: She is not in acute distress.    Appearance: She is not toxic-appearing or diaphoretic.  Cardiovascular:     Rate and Rhythm: Normal rate.     Heart sounds: Normal heart sounds.  Pulmonary:     Effort: Pulmonary effort is normal. No respiratory distress.     Breath sounds: Normal breath sounds.  Abdominal:     General: Abdomen is flat. There is no distension.     Palpations: Abdomen is soft.  Neurological:     Mental Status: She is alert.      UC Treatments / Results  Labs (all labs ordered are listed, but only abnormal results are displayed) Labs Reviewed  BASIC METABOLIC PANEL - Abnormal; Notable for the following components:      Result Value   Sodium 131 (*)    Chloride 95 (*)    Glucose, Bld 112 (*)    Creatinine, Ser 1.48 (*)    GFR calc non Af Amer 35 (*)    GFR calc Af Amer 40 (*)    All other components within normal limits    EKG   Radiology No results found.  Procedures Procedures (including critical care time)  Medications  Ordered in UC Medications - No data to display  Initial Impression / Assessment and Plan / UC Course  I have reviewed the triage vital signs and the nursing notes.  Pertinent labs &  imaging results that were available during my care of the patient were reviewed by me and considered in my medical decision making (see chart for details).      Final Clinical Impressions(s) / UC Diagnoses   Final diagnoses:  History of dizziness  Hyponatremia  Non-intractable vomiting with nausea, unspecified vomiting type     Discharge Instructions     Increase water/electrolyte intake  Follow up with primary care provider next week to recheck blood tests/labs    ED Prescriptions    Medication Sig Dispense Auth. Provider   ondansetron (ZOFRAN ODT) 4 MG disintegrating tablet Take 1 tablet (4 mg total) by mouth every 8 (eight) hours as needed for nausea or vomiting. 6 tablet Norval Gable, MD      1. Lab results and diagnosis reviewed with patient 2. rx as per orders above; reviewed possible side effects, interactions, risks and benefits  3. Recommend supportive treatment as above 4. Follow-up prn if symptoms worsen or don't improve   PDMP not reviewed this encounter.   Norval Gable, MD 04/25/19 [id number]

## 2019-04-30 ENCOUNTER — Telehealth: Payer: Self-pay | Admitting: Family Medicine

## 2019-04-30 ENCOUNTER — Other Ambulatory Visit: Payer: Self-pay

## 2019-04-30 MED ORDER — HYDROCHLOROTHIAZIDE 25 MG PO TABS: 25.0000 mg | ORAL_TABLET | Freq: Every day | ORAL | 0 refills | Status: DC

## 2019-05-18 ENCOUNTER — Encounter: Payer: Self-pay | Admitting: Family Medicine

## 2019-05-18 ENCOUNTER — Other Ambulatory Visit: Payer: Self-pay

## 2019-05-18 ENCOUNTER — Telehealth: Payer: Self-pay | Admitting: Family Medicine

## 2019-05-18 ENCOUNTER — Ambulatory Visit (INDEPENDENT_AMBULATORY_CARE_PROVIDER_SITE_OTHER): Payer: Medicare Other | Admitting: Family Medicine

## 2019-05-18 VITALS — BP 122/78 | HR 94 | Temp 96.9°F | Resp 16 | Ht 66.5 in | Wt 212.9 lb

## 2019-05-18 DIAGNOSIS — E039 Hypothyroidism, unspecified: Secondary | ICD-10-CM | POA: Diagnosis not present

## 2019-05-18 DIAGNOSIS — N179 Acute kidney failure, unspecified: Secondary | ICD-10-CM

## 2019-05-18 DIAGNOSIS — R112 Nausea with vomiting, unspecified: Secondary | ICD-10-CM

## 2019-05-18 DIAGNOSIS — F320 Major depressive disorder, single episode, mild: Secondary | ICD-10-CM | POA: Diagnosis not present

## 2019-05-18 DIAGNOSIS — E119 Type 2 diabetes mellitus without complications: Secondary | ICD-10-CM

## 2019-05-18 DIAGNOSIS — K219 Gastro-esophageal reflux disease without esophagitis: Secondary | ICD-10-CM

## 2019-05-18 DIAGNOSIS — E785 Hyperlipidemia, unspecified: Secondary | ICD-10-CM

## 2019-05-18 DIAGNOSIS — K7460 Unspecified cirrhosis of liver: Secondary | ICD-10-CM

## 2019-05-18 DIAGNOSIS — I10 Essential (primary) hypertension: Secondary | ICD-10-CM

## 2019-05-18 MED ORDER — ATORVASTATIN CALCIUM 40 MG PO TABS: 40.0000 mg | ORAL_TABLET | Freq: Every day | ORAL | 3 refills | Status: DC

## 2019-05-18 MED ORDER — BUPROPION HCL 75 MG PO TABS: 150.0000 mg | ORAL_TABLET | Freq: Two times a day (BID) | ORAL | 3 refills | Status: DC

## 2019-05-18 MED ORDER — LOSARTAN POTASSIUM 50 MG PO TABS: 50.0000 mg | ORAL_TABLET | Freq: Two times a day (BID) | ORAL | 3 refills | Status: DC

## 2019-05-18 MED ORDER — ONDANSETRON 4 MG PO TBDP: 4.0000 mg | ORAL_TABLET | Freq: Three times a day (TID) | ORAL | 0 refills | Status: DC | PRN

## 2019-05-18 MED ORDER — METFORMIN HCL 500 MG PO TABS: 500.0000 mg | ORAL_TABLET | Freq: Two times a day (BID) | ORAL | 2 refills | Status: DC

## 2019-05-18 MED ORDER — PANTOPRAZOLE SODIUM 20 MG PO TBEC: 20.0000 mg | DELAYED_RELEASE_TABLET | Freq: Every day | ORAL | 1 refills | Status: DC

## 2019-05-18 MED ORDER — CITALOPRAM HYDROBROMIDE 10 MG PO TABS: 10.0000 mg | ORAL_TABLET | Freq: Every day | ORAL | 3 refills | Status: DC

## 2019-05-18 MED ORDER — BUPROPION HCL 75 MG PO TABS: 75.0000 mg | ORAL_TABLET | Freq: Two times a day (BID) | ORAL | 11 refills | Status: DC

## 2019-05-18 NOTE — Progress Notes (Signed)
Name: Katherine Stevens   MRN: [id number]    DOB: 1945/04/17   Date:05/18/2019       Progress Note  Chief Complaint  Patient presents with  . Hypothyroidism  . Nausea     Subjective:   Katherine Stevens is a 74 y.o. female, presents to clinic for routine follow up on the conditions listed above.  [name] was new to establish care about 1 month ago, there was many changes in her care and specialist, her family members were trying to get her to establish in [place], they were concerned about her memory forgetfulness and concerned about her safety at home alone.  Patient stated throughout the visit that she was fine.  Medication list was very unclear about her medicines and dosing.  Patient had been preparing her own pillbox.  She has been seeing neurology for cognitive impairments   Hypothyroidism: TSH very high last visit, they did go and check her medicine box and have been making sure that she takes her levothyroxine 75 mcg every morning 30 minutes before breakfast or any other medications.    Current Symptoms: denies fatigue, weight changes, heat/cold intolerance, bowel/skin changes or CVS symptoms Most recent results are below; we will be repeating labs today. Lab Results  Component Value Date   TSH 41.76 (H) 04/16/2019   Prior care through [facility]  05/24/2018 TSH 18.100 10/25/2018 TSH 3.571 Last visit HPI :Pt and her sister are not sure how or when she is taking her levothyroxin, they are concerned about her cognitive function and memory and the pt is adamant that she is fine and she arranged her pills in her pill box before her family could do it and her son is so worried about her he is checking on her every hour. For the past month patient has been compliant with taking her medications worsening in the morning without food.  Her son is with her states that she has seemed less confused.  He does continue to stop by multiple times a day to check on her.  When I asked him what  he is concerned about he states he is just worried that she is going to fall.  Per neuro - she does have HH PT   Last labs were from 04/20/2019 shortly after our first visit, pt presented to UC, was vomiting and labs showed has CKD with AKI, she was given zofran and only took a few of them. Patient has been gradually losing weight and she states that she has no appetite and she continues to note intermittent nausea and her son is here with her today states that she has had some vomiting episodes as well.  She states she has no appetite, her nausea is worse when she is eating.  She does endorse indigestion bloating reflux and denies any abdominal pain, diarrhea, constipation, urinary symptoms    Hypertension:  Stable since last visit - no med changed, BP good today BP Readings from Last 3 Encounters:  05/18/19 122/78  04/20/19 118/65  04/16/19 134/72  DM stable with last visit-  HLD - done last visit stable lipid panel good- meds unchanged.  Depression screen PHQ 2/9 04/16/2019  Decreased Interest 1  Down, Depressed, Hopeless 0  PHQ - 2 Score 1  Altered sleeping 0  Tired, decreased energy 0  Change in appetite 0  Feeling bad or failure about yourself  0  Trouble concentrating 2  Moving slowly or fidgety/restless 2  Suicidal thoughts 0  PHQ-9 Score 5  Difficult doing work/chores Not difficult at all  Pt states her mood is good - they do request a refill on wellbutrin today - taking 75 mg - 2 tabs (?) twice a day  Weight decreasing, just has a loss of appetite Wt Readings from Last 5 Encounters:  05/18/19 212 lb 14.4 oz (96.6 kg)  04/20/19 223 lb (101.2 kg)  04/16/19 223 lb 1.6 oz (101.2 kg)  03/31/19 240 lb (108.9 kg)   BMI Readings from Last 5 Encounters:  05/18/19 33.85 kg/m  04/20/19 35.45 kg/m  04/16/19 35.47 kg/m  03/31/19 38.16 kg/m   Appetite down, sick on her stomach - intermittent vomiting, she denies any past hx of PUD/GI bleed, abd surgery.   Has previously  seen GI at [facility] - recent abd MRI showed cirrhosis: ----- Message from [facility] [place] sent at 02/09/2019 2:19 PM EST ----- Regarding: MRI of abdomen Becky,  Please let Ms. Arzola know her MRI of abdomen does not reveal any worrisome findings. Findings still consistent with cirrhosis.   Impression: Liver cirrhosis.  Previously described foci of arterial enhancement which fade to isointensity on delayed phase imaging no longer definitively visualized and may be perfusional in etiology.   Dawn     Called her son back after visit - changing metformin after reviewing chart more thoroughly - she had been taking 500 mg BID and somehow she increased it to 1000 mg BID - may be causing her stomach upset Her son states she is eating a lot of tums and Alka-Seltzer and I did explain that she had previously been seeing GI had diagnosis of GERD and was on Pepcid and other medicines in the past and said the Protonix medicine that we sent through today will be treating that  Last office visit with her prior PCP was July 2020:T2Dm, HTN, hypothyoridism, depression, OA and Compensated Cirrhosis 2/2 to HCV s/p treatment w/ cure who presents for general follow-up.     Patient Active Problem List   Diagnosis Date Noted  . Current mild episode of major depressive disorder (HCC) 04/16/2019  . Type 2 diabetes mellitus without complication, without long-term current use of insulin (HCC) 04/16/2019  . Hypothyroidism 04/16/2019  . Hyperlipidemia 04/16/2019  . Cirrhosis of liver with ascites (HCC) 04/16/2019  . Vertigo 04/16/2019  . 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 Folate deficiency 04/16/2019  . Vitamin B1 deficiency 04/16/2019  . Tobacco dependence 12/12/2015  . Osteoarthritis of both knees 08/01/2012  . Bladder spasm 07/28/2012  . Seasonal allergies 07/28/2012  . Chronic hepatitis C without hepatic coma (HCC) 02/07/2012  . Obesity 07/29/2011    Past Surgical  History:  Procedure Laterality Date  . ABDOMINAL HYSTERECTOMY      Family History  Problem Relation Age of Onset  . Heart failure Mother   . Diabetes Mother   . Diabetes Sister   . Hyperlipidemia Sister   . Thyroid disease Sister   . Stroke Brother   . Heart attack Brother   . Cancer Maternal Grandmother   . Heart attack Brother     Social History   Tobacco Use  . Smoking status: Current Every Day Smoker    Packs/day: 0.25    Types: Cigarettes  . Smokeless tobacco: Never Used  . Tobacco comment: 6-7 cigarettes a day  Substance Use Topics  . Alcohol use: Not Currently  . Drug use: Not Currently      Current Outpatient Medications:  .  atorvastatin (LIPITOR) 40 MG  tablet, Take 40 mg by mouth at bedtime. , Disp: , Rfl:  .  buPROPion (WELLBUTRIN) 75 MG tablet, Take 75 mg by mouth 2 (two) times daily. 2tabs bid, Disp: , Rfl:  .  citalopram (CELEXA) 10 MG tablet, Take 10 mg by mouth daily. , Disp: , Rfl:  .  donepezil (ARICEPT) 5 MG tablet, Take by mouth., Disp: , Rfl:  .  hydrochlorothiazide (HYDRODIURIL) 25 MG tablet, Take 1 tablet (25 mg total) by mouth daily., Disp: 90 tablet, Rfl: 0 .  levothyroxine (SYNTHROID) 75 MCG tablet, Take 75 mcg by mouth daily before breakfast. , Disp: , Rfl:  .  loratadine (CLARITIN) 10 MG tablet, Take 10 mg by mouth daily. , Disp: , Rfl:  .  metFORMIN (GLUCOPHAGE) 500 MG tablet, Take 500 mg by mouth 2 (two) times daily with a meal. , Disp: , Rfl:  .  ondansetron (ZOFRAN ODT) 4 MG disintegrating tablet, Take 1 tablet (4 mg total) by mouth every 8 (eight) hours as needed for nausea or vomiting., Disp: 6 tablet, Rfl: 0 .  oxybutynin (DITROPAN-XL) 5 MG 24 hr tablet, Take 5 mg by mouth at bedtime. , Disp: , Rfl:  .  vitamin B-12 (CYANOCOBALAMIN) 500 MCG tablet, Take 500 mcg by mouth 2 (two) times daily., Disp: , Rfl:  .  losartan (COZAAR) 50 MG tablet, Take 50 mg by mouth 2 (two) times daily. , Disp: , Rfl:   Allergies  Allergen Reactions  .  Lisinopril Anaphylaxis    MOUTH & JAW SWELLING    Chart Review Today: I personally reviewed active problem list, medication list, allergies, family history, social history, health maintenance, notes from last encounter, lab results, imaging with the patient/caregiver today.   Review of Systems  Constitutional: Negative.   HENT: Negative.   Eyes: Negative.   Respiratory: Negative.   Cardiovascular: Negative.   Gastrointestinal: Negative.   Endocrine: Negative.   Genitourinary: Negative.   Musculoskeletal: Negative.   Skin: Negative.   Allergic/Immunologic: Negative.   Neurological: Negative.   Hematological: Negative.   Psychiatric/Behavioral: Negative.   All other systems reviewed and are negative.    Objective:    Vitals:   05/18/19 1143  BP: 122/78  Pulse: 94  Resp: 16  Temp: (!) 96.9 F (36.1 C)  TempSrc: Temporal  SpO2: 96%  Weight: 212 lb 14.4 oz (96.6 kg)  Height: 5' 6.5" (1.689 m)    Body mass index is 33.85 kg/m.  Physical Exam Vitals and nursing note reviewed.  Constitutional:      General: She is not in acute distress.    Appearance: She is obese. She is not ill-appearing, toxic-appearing or diaphoretic.  HENT:     Head: Normocephalic and atraumatic.     Right Ear: External ear normal.     Left Ear: External ear normal.     Nose: Nose normal.     Mouth/Throat:     Mouth: Mucous membranes are moist.     Pharynx: Oropharynx is clear.  Eyes:     Conjunctiva/sclera: Conjunctivae normal.     Pupils: Pupils are equal, round, and reactive to light.  Cardiovascular:     Rate and Rhythm: Normal rate and regular rhythm.     Pulses: Normal pulses.     Heart sounds: Normal heart sounds.  Pulmonary:     Effort: Pulmonary effort is normal.     Breath sounds: Normal breath sounds.  Abdominal:     General: There is no distension.  Tenderness: There is no abdominal tenderness. There is no right CVA tenderness, left CVA tenderness, guarding or rebound.   Skin:    General: Skin is warm.     Capillary Refill: Capillary refill takes less than 2 seconds.     Coloration: Skin is not jaundiced or pale.  Neurological:     Mental Status: She is alert.     Gait: Gait abnormal.  Psychiatric:        Mood and Affect: Mood normal.        Behavior: Behavior normal.       PHQ2/9: Depression screen PHQ 2/9 04/16/2019  Decreased Interest 1  Down, Depressed, Hopeless 0  PHQ - 2 Score 1  Altered sleeping 0  Tired, decreased energy 0  Change in appetite 0  Feeling bad or failure about yourself  0  Trouble concentrating 2  Moving slowly or fidgety/restless 2  Suicidal thoughts 0  PHQ-9 Score 5  Difficult doing work/chores Not difficult at all    phq 9 is positive, reviewed today, on meds  Fall Risk: Fall Risk  05/18/2019 04/16/2019  Falls in the past year? 1 1  Number falls in past yr: 1 0  Injury with Fall? 0 1  Comment - left rib, was xrayed    Functional Status Survey: Is the patient deaf or have difficulty hearing?: No Does the patient have difficulty seeing, even when wearing glasses/contacts?: Yes Does the patient have difficulty concentrating, remembering, or making decisions?: Yes Does the patient have difficulty walking or climbing stairs?: Yes Does the patient have difficulty dressing or bathing?: Yes Does the patient have difficulty doing errands alone such as visiting a doctor's office or shopping?: Yes   Assessment & Plan:     ICD-10-CM   1. Hypothyroidism, unspecified type  E03.9 TSH   Unclear past med compliance, recheck TSh today and adjust med dose accordingly  2. AKI (acute kidney injury) ([place])  G86.7 COMPLETE METABOLIC PANEL WITH GFR   f/up from labs a few weeks ago at [facility] after N/V  3. Current mild episode of major depressive disorder, unspecified whether recurrent (HCC)  F32.0 DISCONTINUED: buPROPion (WELLBUTRIN) 75 MG tablet  4. Nausea and vomiting, intractability of vomiting not specified, unspecified vomiting  type  R11.2 ondansetron (ZOFRAN ODT) 4 MG disintegrating tablet    DISCONTINUED: pantoprazole (PROTONIX) 20 MG tablet   Ddx includes gerd, gallbladder pathology, med side effect of Metformin, cirrhosis.  Start protonix at bedtime, zofran prn, decrease metformin dose  5. Cirrhosis of liver without ascites, unspecified hepatic cirrhosis type (HCC)  K74.60    may need GI - hx of hep C and cirrhosis, I do not see any past hepatic encephalopathy or elevated ammonia  6. Gastroesophageal reflux disease without esophagitis  K21.9    per GI [facility] last year - pt was on pepcid, will start Protonix daily at bedtime close follow-up   Meds and dosing clarified, put through more 90 d supply since everything has been out of [place] system  Return if symptoms worsen or fail to improve, for 2-4 weeks nausea, vomiting, decreased appetite weight,   3 month routine f/up in office.   Delsa Grana, PA-C 05/18/19 12:05 PM

## 2019-05-18 NOTE — Telephone Encounter (Addendum)
Pharm needs clarification on pantoprazole 20 mg .  The directions says one tablet by mouth at bedtime prior to breakfast

## 2019-05-18 NOTE — Telephone Encounter (Signed)
Which one am or pm?

## 2019-05-19 LAB — COMPLETE METABOLIC PANEL WITH GFR
AG Ratio: 1.3 (calc) (ref 1.0–2.5)
ALT: 11 U/L (ref 6–29)
AST: 23 U/L (ref 10–35)
Albumin: 4.5 g/dL (ref 3.6–5.1)
Alkaline phosphatase (APISO): 90 U/L (ref 37–153)
BUN/Creatinine Ratio: 9 (calc) (ref 6–22)
BUN: 11 mg/dL (ref 7–25)
CO2: 22 mmol/L (ref 20–32)
Calcium: 9.4 mg/dL (ref 8.6–10.4)
Chloride: 98 mmol/L (ref 98–110)
Creat: 1.19 mg/dL — ABNORMAL HIGH (ref 0.60–0.93)
GFR, Est African American: 52 mL/min/{1.73_m2} — ABNORMAL LOW (ref >=60)
GFR, Est Non African American: 45 mL/min/{1.73_m2} — ABNORMAL LOW (ref >=60)
Globulin: 3.5 g/dL (calc) (ref 1.9–3.7)
Glucose, Bld: 95 mg/dL (ref 65–99)
Potassium: 3.8 mmol/L (ref 3.5–5.3)
Sodium: 133 mmol/L — ABNORMAL LOW (ref 135–146)
Total Bilirubin: 0.6 mg/dL (ref 0.2–1.2)
Total Protein: 8 g/dL (ref 6.1–8.1)

## 2019-05-19 LAB — TSH: TSH: 8.18 mIU/L — ABNORMAL HIGH (ref 0.40–4.50)

## 2019-05-22 ENCOUNTER — Other Ambulatory Visit: Payer: Self-pay | Admitting: Family Medicine

## 2019-05-22 DIAGNOSIS — E039 Hypothyroidism, unspecified: Secondary | ICD-10-CM

## 2019-05-22 MED ORDER — LEVOTHYROXINE SODIUM 125 MCG PO TABS: 125.0000 ug | ORAL_TABLET | Freq: Every day | ORAL | 3 refills | Status: DC

## 2019-06-13 ENCOUNTER — Encounter: Payer: Self-pay | Admitting: Family Medicine

## 2019-06-13 ENCOUNTER — Telehealth (INDEPENDENT_AMBULATORY_CARE_PROVIDER_SITE_OTHER): Payer: Medicare Other | Admitting: Family Medicine

## 2019-06-13 VITALS — Wt 212.0 lb

## 2019-06-13 DIAGNOSIS — E039 Hypothyroidism, unspecified: Secondary | ICD-10-CM | POA: Diagnosis not present

## 2019-06-13 DIAGNOSIS — E119 Type 2 diabetes mellitus without complications: Secondary | ICD-10-CM | POA: Diagnosis not present

## 2019-06-13 DIAGNOSIS — K219 Gastro-esophageal reflux disease without esophagitis: Secondary | ICD-10-CM

## 2019-06-13 NOTE — Progress Notes (Signed)
Name: Katherine Stevens   MRN: [id number]    DOB: 1945/11/21   Date:06/13/2019       Progress Note  Subjective:    Chief Complaint  Chief Complaint  Patient presents with  . Follow-up    son says meds make her drowsy and she does not want to take anymore? Feeling better stomach wise    I connected with  Burna Cash  on 06/13/19 at  1:40 PM EST by a video enabled telemedicine application and verified that I am speaking with the correct person using two identifiers.  I discussed the limitations of evaluation and management by telemedicine and the availability of in person appointments. The patient expressed understanding and agreed to proceed. Staff also discussed with the patient that there may be a patient responsible charge related to this service. Patient Location: home with her son Provider Location: [facility] [place] clinic Additional Individuals present: son  HPI Patient had reported that medications were making her drowsy so they made an appointment for follow-up today.  They are not sure which medications are making her drowsy because are all in her pillbox patient has been taking most of them as prescribed.  Encouraged her to continue to take her levothyroxine because it will take a few months to get her hormone levels back to normal previously TSH was severely elevated and she was not on meds or not taking them correctly the history is slightly unclear.  Patient previously had nausea and vomiting we had decrease her metformin dose and gave her some medications for acid reflux and for nausea.  We reviewed this again today because the patient was still unclear in this patient's son was still unclear about her Metformin dosing.  Has not had as much abdominal discomfort she has not had any recent vomiting.  Patient Active Problem List   Diagnosis Date Noted  . Gastroesophageal reflux disease without esophagitis 05/18/2019  . Current mild episode of major depressive disorder ([place])  04/16/2019  . Type 2 diabetes mellitus without complication, without long-term current use of insulin ([place]) 04/16/2019  . Hypothyroidism 04/16/2019  . Hyperlipidemia 04/16/2019  . Cirrhosis of liver without ascites ([place]) 04/16/2019  . Vertigo 04/16/2019  . Mixed Alzheimer's and vascular dementia ([place]) 04/16/2019  . Folate deficiency 04/16/2019  . Vitamin B1 deficiency 04/16/2019  . Tobacco dependence 12/12/2015  . Osteoarthritis of both knees 08/01/2012  . Bladder spasm 07/28/2012  . Seasonal allergies 07/28/2012  . Chronic hepatitis C without hepatic coma ([place]) 02/07/2012  . Obesity 07/29/2011    Social History   Tobacco Use  . Smoking status: Current Every Day Smoker    Packs/day: 0.25    Types: Cigarettes  . Smokeless tobacco: Never Used  . Tobacco comment: 6-7 cigarettes a day  Substance Use Topics  . Alcohol use: Not Currently     Current Outpatient Medications:  .  atorvastatin (LIPITOR) 40 MG tablet, Take 1 tablet (40 mg total) by mouth at bedtime., Disp: 90 tablet, Rfl: 3 .  buPROPion (WELLBUTRIN) 75 MG tablet, Take 2 tablets (150 mg total) by mouth 2 (two) times daily., Disp: 360 tablet, Rfl: 3 .  citalopram (CELEXA) 10 MG tablet, Take 1 tablet (10 mg total) by mouth daily., Disp: 90 tablet, Rfl: 3 .  donepezil (ARICEPT) 5 MG tablet, Take by mouth., Disp: , Rfl:  .  levothyroxine (SYNTHROID) 125 MCG tablet, Take 1 tablet (125 mcg total) by mouth daily before breakfast., Disp: 90 tablet, Rfl: 3 .  loratadine (CLARITIN) 10 MG  tablet, Take 10 mg by mouth daily. , Disp: , Rfl:  .  losartan (COZAAR) 50 MG tablet, Take 1 tablet (50 mg total) by mouth 2 (two) times daily., Disp: 180 tablet, Rfl: 3 .  metFORMIN (GLUCOPHAGE) 500 MG tablet, Take 1 tablet (500 mg total) by mouth 2 (two) times daily with a meal., Disp: 180 tablet, Rfl: 2 .  ondansetron (ZOFRAN ODT) 4 MG disintegrating tablet, Take 1 tablet (4 mg total) by mouth every 8 (eight) hours as needed for nausea or  vomiting., Disp: 20 tablet, Rfl: 0 .  oxybutynin (DITROPAN-XL) 5 MG 24 hr tablet, Take 5 mg by mouth at bedtime. , Disp: , Rfl:  .  pantoprazole (PROTONIX) 20 MG tablet, Take 1 tablet (20 mg total) by mouth at bedtime., Disp: 30 tablet, Rfl: 1 .  Travoprost, BAK Free, (TRAVATAN) 0.004 % SOLN ophthalmic solution, 1 drop at bedtime., Disp: , Rfl:  .  vitamin B-12 (CYANOCOBALAMIN) 500 MCG tablet, Take 500 mcg by mouth 2 (two) times daily., Disp: , Rfl:   Allergies  Allergen Reactions  . Lisinopril Anaphylaxis    MOUTH & JAW SWELLING   I personally reviewed active problem list, medication list, allergies, family history, social history, health maintenance, notes from last encounter, lab results, imaging with the patient/caregiver today.   Review of Systems  10 Systems reviewed and are negative for acute change except as noted in the HPI.   Objective:   Virtual encounter, vitals limited, only able to obtain the following Today's Vitals   06/13/19 1208  Weight: 212 lb (96.2 kg)   Body mass index is 33.71 kg/m. Nursing Note and Vital Signs reviewed.  Physical Exam Well-appearing elderly obese female nontoxic-appearing PE limited by telephone encounter  No results found for this or any previous visit (from the past 72 hour(s)).  Assessment and Plan:     ICD-10-CM   1. Gastroesophageal reflux disease without esophagitis  K21.9    Stomach symptoms improving do suspect it was some acid reflux or gastritis encouraged her to continue Protonix  2. Type 2 diabetes mellitus without complication, without long-term current use of insulin (HCC)  E11.9    Again reviewed and clarified her medication, because of GI symptoms and well-controlled diabetes she is to take 500 mg Metformin twice a day  3. Hypothyroidism, unspecified type  E03.9    Encouraged her to continue to take her levothyroxine medicine versus in the morning before breakfast it will improve her fatigue needs in office follow-up       - I discussed the assessment and treatment plan with the patient. The patient was provided an opportunity to ask questions and all were answered. The patient agreed with the plan and demonstrated an understanding of the instructions.  I provided 20 minutes of non-face-to-face time during this encounter.  Danelle Berry, PA-C 06/13/19 12:54 PM

## 2019-06-21 ENCOUNTER — Other Ambulatory Visit: Payer: Self-pay

## 2019-06-21 ENCOUNTER — Ambulatory Visit (INDEPENDENT_AMBULATORY_CARE_PROVIDER_SITE_OTHER): Payer: Medicare Other | Admitting: Gastroenterology

## 2019-06-21 ENCOUNTER — Encounter: Payer: Self-pay | Admitting: Gastroenterology

## 2019-06-21 VITALS — BP 121/71 | HR 92 | Temp 98.7°F | Ht 66.5 in | Wt 213.0 lb

## 2019-06-21 DIAGNOSIS — K7460 Unspecified cirrhosis of liver: Secondary | ICD-10-CM | POA: Diagnosis not present

## 2019-06-21 NOTE — Progress Notes (Signed)
Gastroenterology Consultation  Referring Provider:     Delsa Grana, PA-C Primary Care Physician:  Delsa Grana, PA-C Primary Gastroenterologist:  Dr. Allen Norris     Reason for Consultation:     Cirrhosis        HPI:   Katherine Stevens is a 74 y.o. y/o female referred for consultation & management of cirrhosis by Dr. Delsa Grana, PA-C.  This patient comes in today for transfer of care after being seen for some time at [facility] hepatology.  The patient was last seen there in August 2020.  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  The patient was treated in the past with interferon and ribavirin as part of a clinical trial and then again with Harvoni with sustained viral response.  The patient was found to have a nodular liver on imaging and had a MRI as a follow-up for lesion seen in the past that showed:  IMPRESSION:(November 2020) Liver cirrhosis. Previously described foci of arterial enhancement which fade to isointensity on delayed phase imaging no longer definitively visualized and may be perfusional in etiology.   The patient now wants to transfer care from [facility] hepatology to [place] because I am closer.  The patient also reports that she has had mini strokes and now she is walking with a cane.  She denies any jaundice dark urine change in bowel habits or abdominal pain she also denies any abdominal distention.  Past Medical History:  Diagnosis Date  . B12 deficiency   . Depression   . Diabetes mellitus without complication ([place])   . Hyperlipidemia   . Hypertension   . Memory loss   . Overactive bladder   . Thyroid disease     Past Surgical History:  Procedure Laterality Date  . ABDOMINAL HYSTERECTOMY      Prior to Admission medications   Medication Sig Start Date End Date Taking? Authorizing Provider  atorvastatin (LIPITOR) 40 MG tablet Take 1 tablet (40 mg total) by mouth at bedtime. 05/18/19 05/17/20  Delsa Grana, PA-C  buPROPion (WELLBUTRIN) 75  MG tablet Take 2 tablets (150 mg total) by mouth 2 (two) times daily. 05/18/19 05/17/20  Delsa Grana, PA-C  citalopram (CELEXA) 10 MG tablet Take 1 tablet (10 mg total) by mouth daily. 05/18/19 05/17/20  Delsa Grana, PA-C  donepezil (ARICEPT) 5 MG tablet Take by mouth. 04/04/19   [provider]  levothyroxine (SYNTHROID) 125 MCG tablet Take 1 tablet (125 mcg total) by mouth daily before breakfast. 05/22/19   Delsa Grana, PA-C  loratadine (CLARITIN) 10 MG tablet Take 10 mg by mouth daily.  11/23/18   [provider]  losartan (COZAAR) 50 MG tablet Take 1 tablet (50 mg total) by mouth 2 (two) times daily. 05/18/19 05/17/20  Delsa Grana, PA-C  metFORMIN (GLUCOPHAGE) 500 MG tablet Take 1 tablet (500 mg total) by mouth 2 (two) times daily with a meal. 05/18/19   Delsa Grana, PA-C  ondansetron (ZOFRAN ODT) 4 MG disintegrating tablet Take 1 tablet (4 mg total) by mouth every 8 (eight) hours as needed for nausea or vomiting. 05/18/19   Delsa Grana, PA-C  oxybutynin (DITROPAN-XL) 5 MG 24 hr tablet Take 5 mg by mouth at bedtime.  11/23/18 11/23/19  [provider]  pantoprazole (PROTONIX) 20 MG tablet Take 1 tablet (20 mg total) by mouth at bedtime. 05/18/19   Delsa Grana, PA-C  Travoprost, BAK Free, (TRAVATAN) 0.004 % SOLN ophthalmic solution 1 drop at bedtime. 11/23/18  [provider]  vitamin B-12 (CYANOCOBALAMIN) 500 MCG tablet Take 500 mcg by mouth 2 (two) times daily.    [provider]    Family History  Problem Relation Age of Onset  . Heart failure Mother   . Diabetes Mother   . Diabetes Sister   . Hyperlipidemia Sister   . Thyroid disease Sister   . Stroke Brother   . Heart attack Brother   . Cancer Maternal Grandmother   . Heart attack Brother      Social History   Tobacco Use  . Smoking status: Current Every Day Smoker    Packs/day: 0.25    Types: Cigarettes  . Smokeless tobacco: Never Used  . Tobacco comment: 6-7 cigarettes a day    Substance Use Topics  . Alcohol use: Not Currently  . Drug use: Not Currently    Allergies as of 06/21/2019 - Review Complete 06/13/2019  Allergen Reaction Noted  . Lisinopril Anaphylaxis 09/04/2014    Review of Systems:    All systems reviewed and negative except where noted in HPI.   Physical Exam:  There were no vitals taken for this visit. No LMP recorded. Patient has had a hysterectomy. General:   Alert,  Well-developed, well-nourished, pleasant and cooperative in NAD Head:  Normocephalic and atraumatic. Eyes:  Sclera clear, no icterus.   Conjunctiva pink. Ears:  Normal auditory acuity. Neck:  Supple; no masses or thyromegaly. Lungs:  Respirations even and unlabored.  Clear throughout to auscultation.   No wheezes, crackles, or rhonchi. No acute distress. Heart:  Regular rate and rhythm; no murmurs, clicks, rubs, or gallops. Abdomen:  Normal bowel sounds.  No bruits.  Soft, non-tender and non-distended without masses, hepatosplenomegaly or hernias noted.  No guarding or rebound tenderness.  Negative Carnett sign.   Rectal:  Deferred.  Pulses:  Normal pulses noted. Extremities:  No clubbing or edema.  No cyanosis. Neurologic:  Alert and oriented x3;  grossly normal neurologically. Skin:  Intact without significant lesions or rashes.  No jaundice. Lymph Nodes:  No significant cervical adenopathy. Psych:  Alert and cooperative. Normal mood and affect.  Imaging Studies: No results found.  Assessment and Plan:   Katherine Stevens is a 74 y.o. y/o female who comes in for transfer of care for cirrhosis.  The patient has a history of a blood transfusion of unscreened blood, IV drug use and hepatitis C.  The patient is no longer using IV drugs and has been cured of her hepatitis C with Harvoni.  The patient is now transferring care for follow-up of her cirrhosis.  The patient is due for a MRI of her liver and an alpha-fetoprotein next month.  The patient will be set up for these  and has been told the plan and agrees with it    Midge Minium, MD. Clementeen Graham    Note: This dictation was prepared with Dragon dictation along with smaller phrase technology. Any transcriptional errors that result from this process are unintentional.

## 2019-06-27 NOTE — Telephone Encounter (Signed)
Erroneous Entry  

## 2019-07-03 ENCOUNTER — Ambulatory Visit (INDEPENDENT_AMBULATORY_CARE_PROVIDER_SITE_OTHER): Payer: Medicare Other

## 2019-07-03 DIAGNOSIS — Z0000 Encounter for general adult medical examination without abnormal findings: Secondary | ICD-10-CM

## 2019-07-03 NOTE — Patient Instructions (Signed)
Katherine Stevens , Thank you for taking time to come for your Medicare Wellness Visit. I appreciate your ongoing commitment to your health goals. Please review the following plan we discussed and let me know if I can assist you in the future.   Screening recommendations/referrals: Colonoscopy: postponed Mammogram: postponed Bone Density: postponed Recommended yearly ophthalmology/optometry visit for glaucoma screening and checkup Recommended yearly dental visit for hygiene and checkup  Vaccinations: Influenza vaccine: done 01/04/19 Pneumococcal vaccine: done 10/30/14 Tdap vaccine: due Shingles vaccine: Shingrix discussed. Please contact your pharmacy for coverage information.   Advanced directives: Advance directive discussed with you today. Even though you declined this today please call our office should you change your mind and we can give you the proper paperwork for you to fill out.  Conditions/risks identified: Recommend to continue to prevent falls  Next appointment: Please follow up in one year for your Medicare Annual Wellness visit.     Preventive Care 14 Years and Older, Female Preventive care refers to lifestyle choices and visits with your health care provider that can promote health and wellness. What does preventive care include?  A yearly physical exam. This is also called an annual well check.  Dental exams once or twice a year.  Routine eye exams. Ask your health care provider how often you should have your eyes checked.  Personal lifestyle choices, including:  Daily care of your teeth and gums.  Regular physical activity.  Eating a healthy diet.  Avoiding tobacco and drug use.  Limiting alcohol use.  Practicing safe sex.  Taking low-dose aspirin every day.  Taking vitamin and mineral supplements as recommended by your health care provider. What happens during an annual well check? The services and screenings done by your health care provider during  your annual well check will depend on your age, overall health, lifestyle risk factors, and family history of disease. Counseling  Your health care provider may ask you questions about your:  Alcohol use.  Tobacco use.  Drug use.  Emotional well-being.  Home and relationship well-being.  Sexual activity.  Eating habits.  History of falls.  Memory and ability to understand (cognition).  Work and work Astronomer.  Reproductive health. Screening  You may have the following tests or measurements:  Height, weight, and BMI.  Blood pressure.  Lipid and cholesterol levels. These may be checked every 5 years, or more frequently if you are over 14 years old.  Skin check.  Lung cancer screening. You may have this screening every year starting at age 54 if you have a 30-pack-year history of smoking and currently smoke or have quit within the past 15 years.  Fecal occult blood test (FOBT) of the stool. You may have this test every year starting at age 23.  Flexible sigmoidoscopy or colonoscopy. You may have a sigmoidoscopy every 5 years or a colonoscopy every 10 years starting at age 64.  Hepatitis C blood test.  Hepatitis B blood test.  Sexually transmitted disease (STD) testing.  Diabetes screening. This is done by checking your blood sugar (glucose) after you have not eaten for a while (fasting). You may have this done every 1-3 years.  Bone density scan. This is done to screen for osteoporosis. You may have this done starting at age 39.  Mammogram. This may be done every 1-2 years. Talk to your health care provider about how often you should have regular mammograms. Talk with your health care provider about your test results, treatment options, and if necessary,  the need for more tests. Vaccines  Your health care provider may recommend certain vaccines, such as:  Influenza vaccine. This is recommended every year.  Tetanus, diphtheria, and acellular pertussis (Tdap,  Td) vaccine. You may need a Td booster every 10 years.  Zoster vaccine. You may need this after age 82.  Pneumococcal 13-valent conjugate (PCV13) vaccine. One dose is recommended after age 71.  Pneumococcal polysaccharide (PPSV23) vaccine. One dose is recommended after age 7. Talk to your health care provider about which screenings and vaccines you need and how often you need them. This information is not intended to replace advice given to you by your health care provider. Make sure you discuss any questions you have with your health care provider. Document Released: 04/18/2015 Document Revised: 12/10/2015 Document Reviewed: 01/21/2015 Elsevier Interactive Patient Education  2017 [employer] Prevention in the Home Falls can cause injuries. They can happen to people of all ages. There are many things you can do to make your home safe and to help prevent falls. What can I do on the outside of my home?  Regularly fix the edges of walkways and driveways and fix any cracks.  Remove anything that might make you trip as you walk through a door, such as a raised step or threshold.  Trim any bushes or trees on the path to your home.  Use bright outdoor lighting.  Clear any walking paths of anything that might make someone trip, such as rocks or tools.  Regularly check to see if handrails are loose or broken. Make sure that both sides of any steps have handrails.  Any raised decks and porches should have guardrails on the edges.  Have any leaves, snow, or ice cleared regularly.  Use sand or salt on walking paths during winter.  Clean up any spills in your garage right away. This includes oil or grease spills. What can I do in the bathroom?  Use night lights.  Install grab bars by the toilet and in the tub and shower. Do not use towel bars as grab bars.  Use non-skid mats or decals in the tub or shower.  If you need to sit down in the shower, use a plastic, non-slip  stool.  Keep the floor dry. Clean up any water that spills on the floor as soon as it happens.  Remove soap buildup in the tub or shower regularly.  Attach bath mats securely with double-sided non-slip rug tape.  Do not have throw rugs and other things on the floor that can make you trip. What can I do in the bedroom?  Use night lights.  Make sure that you have a light by your bed that is easy to reach.  Do not use any sheets or blankets that are too big for your bed. They should not hang down onto the floor.  Have a firm chair that has side arms. You can use this for support while you get dressed.  Do not have throw rugs and other things on the floor that can make you trip. What can I do in the kitchen?  Clean up any spills right away.  Avoid walking on wet floors.  Keep items that you use a lot in easy-to-reach places.  If you need to reach something above you, use a strong step stool that has a grab bar.  Keep electrical cords out of the way.  Do not use floor polish or wax that makes floors slippery. If you must use  wax, use non-skid floor wax.  Do not have throw rugs and other things on the floor that can make you trip. What can I do with my stairs?  Do not leave any items on the stairs.  Make sure that there are handrails on both sides of the stairs and use them. Fix handrails that are broken or loose. Make sure that handrails are as long as the stairways.  Check any carpeting to make sure that it is firmly attached to the stairs. Fix any carpet that is loose or worn.  Avoid having throw rugs at the top or bottom of the stairs. If you do have throw rugs, attach them to the floor with carpet tape.  Make sure that you have a light switch at the top of the stairs and the bottom of the stairs. If you do not have them, ask someone to add them for you. What else can I do to help prevent falls?  Wear shoes that:  Do not have high heels.  Have rubber bottoms.  Are  comfortable and fit you well.  Are closed at the toe. Do not wear sandals.  If you use a stepladder:  Make sure that it is fully opened. Do not climb a closed stepladder.  Make sure that both sides of the stepladder are locked into place.  Ask someone to hold it for you, if possible.  Clearly mark and make sure that you can see:  Any grab bars or handrails.  First and last steps.  Where the edge of each step is.  Use tools that help you move around (mobility aids) if they are needed. These include:  Canes.  Walkers.  Scooters.  Crutches.  Turn on the lights when you go into a dark area. Replace any light bulbs as soon as they burn out.  Set up your furniture so you have a clear path. Avoid moving your furniture around.  If any of your floors are uneven, fix them.  If there are any pets around you, be aware of where they are.  Review your medicines with your doctor. Some medicines can make you feel dizzy. This can increase your chance of falling. Ask your doctor what other things that you can do to help prevent falls. This information is not intended to replace advice given to you by your health care provider. Make sure you discuss any questions you have with your health care provider. Document Released: 01/16/2009 Document Revised: 08/28/2015 Document Reviewed: 04/26/2014 Elsevier Interactive Patient Education  2017 [employer].

## 2019-07-03 NOTE — Progress Notes (Signed)
Subjective:   Katherine Stevens is a 74 y.o. female who presents for an Initial Medicare Annual Wellness Visit.  Virtual Visit via Telephone Note  I connected with [name] on 07/03/19 at  3:30 PM EDT by telephone and verified that I am speaking with the correct person using two identifiers.  Medicare Annual Wellness visit completed telephonically due to Covid-19 pandemic.   Location: Patient: home Provider: office   I discussed the limitations, risks, security and privacy concerns of performing an evaluation and management service by telephone and the availability of in person appointments. The patient expressed understanding and agreed to proceed.  Some vital signs may be absent or patient reported.   Reather Littler, LPN   Review of Systems      Cardiac Risk Factors include: advanced age (>37men, >44 women);diabetes mellitus;hypertension;dyslipidemia;obesity (BMI >30kg/m2);smoking/ tobacco exposure     Objective:    There were no vitals filed for this visit. There is no height or weight on file to calculate BMI.  Advanced Directives 07/03/2019 03/31/2019  Does Patient Have a Medical Advance Directive? No No  Would patient like information on creating a medical advance directive? No - Patient declined -    Current Medications (verified) Outpatient Encounter Medications as of 07/03/2019  Medication Sig  . atorvastatin (LIPITOR) 40 MG tablet Take 1 tablet (40 mg total) by mouth at bedtime.  Marland Kitchen buPROPion (WELLBUTRIN) 75 MG tablet Take 2 tablets (150 mg total) by mouth 2 (two) times daily.  . citalopram (CELEXA) 10 MG tablet Take 1 tablet (10 mg total) by mouth daily.  Marland Kitchen donepezil (ARICEPT) 5 MG tablet Take by mouth.  . levothyroxine (SYNTHROID) 125 MCG tablet Take 1 tablet (125 mcg total) by mouth daily before breakfast.  . loratadine (CLARITIN) 10 MG tablet Take 10 mg by mouth daily.   Marland Kitchen losartan (COZAAR) 50 MG tablet Take 1 tablet (50 mg total) by mouth 2 (two)  times daily.  . metFORMIN (GLUCOPHAGE) 500 MG tablet Take 1 tablet (500 mg total) by mouth 2 (two) times daily with a meal.  . oxybutynin (DITROPAN-XL) 5 MG 24 hr tablet Take 5 mg by mouth at bedtime.   . pantoprazole (PROTONIX) 20 MG tablet Take 1 tablet (20 mg total) by mouth at bedtime.  . Travoprost, BAK Free, (TRAVATAN) 0.004 % SOLN ophthalmic solution 1 drop at bedtime.  . vitamin B-12 (CYANOCOBALAMIN) 500 MCG tablet Take 500 mcg by mouth 2 (two) times daily.  . ondansetron (ZOFRAN ODT) 4 MG disintegrating tablet Take 1 tablet (4 mg total) by mouth every 8 (eight) hours as needed for nausea or vomiting. (Patient not taking: Reported on 07/03/2019)   No facility-administered encounter medications on file as of 07/03/2019.    Allergies (verified) Lisinopril   History: Past Medical History:  Diagnosis Date  . B12 deficiency   . Depression   . Diabetes mellitus without complication (HCC)   . Hyperlipidemia   . Hypertension   . Memory loss   . Overactive bladder   . Thyroid disease    Past Surgical History:  Procedure Laterality Date  . ABDOMINAL HYSTERECTOMY     Family History  Problem Relation Age of Onset  . Heart failure Mother   . Diabetes Mother   . Diabetes Sister   . Hyperlipidemia Sister   . Thyroid disease Sister   . Stroke Brother   . Heart attack Brother   . Cancer Maternal Grandmother   . Heart attack Brother    Social History  Socioeconomic History  . Marital status: Divorced    Spouse name: Not on file  . Number of children: 2  . Years of education: Not on file  . Highest education level: GED or equivalent  Occupational History  . Not on file  Tobacco Use  . Smoking status: Current Every Day Smoker    Packs/day: 0.25    Types: Cigarettes  . Smokeless tobacco: Never Used  . Tobacco comment: 6-7 cigarettes a day  Substance and Sexual Activity  . Alcohol use: Not Currently  . Drug use: Not Currently  . Sexual activity: Not Currently  Other  Topics Concern  . Not on file  Social History Narrative  . Not on file   Social Determinants of Health   Financial Resource Strain: Low Risk   . Difficulty of Paying Living Expenses: Not hard at all  Food Insecurity: No Food Insecurity  . Worried About Charity fundraiser in the Last Year: Never true  . Ran Out of Food in the Last Year: Never true  Transportation Needs: No Transportation Needs  . Lack of Transportation (Medical): No  . Lack of Transportation (Non-Medical): No  Physical Activity: Inactive  . Days of Exercise per Week: 0 days  . Minutes of Exercise per Session: 0 min  Stress: No Stress Concern Present  . Feeling of Stress : Not at all  Social Connections: Unknown  . Frequency of Communication with Friends and Family: Patient refused  . Frequency of Social Gatherings with Friends and Family: Patient refused  . Attends Religious Services: Patient refused  . Active Member of Clubs or Organizations: Patient refused  . Attends Archivist Meetings: Patient refused  . Marital Status: Divorced    Tobacco Counseling Ready to quit: Not Answered Counseling given: Not Answered Comment: 6-7 cigarettes a day   Clinical Intake:  Pre-visit preparation completed: Yes  Pain : No/denies pain     Nutritional Risks: None Diabetes: Yes CBG done?: No Did pt. bring in CBG monitor from home?: No   Nutrition Risk Assessment:  Has the patient had any N/V/D within the last 2 months?  No  Does the patient have any non-healing wounds?  No  Has the patient had any unintentional weight loss or weight gain?  No   Diabetes:  Is the patient diabetic?  Yes  If diabetic, was a CBG obtained today?  No  Did the patient bring in their glucometer from home?  No  How often do you monitor your CBG's? Several times per week.  Financial Strains and Diabetes Management:  Are you having any financial strains with the device, your supplies or your medication? No .  Does the  patient want to be seen by Chronic Care Management for management of their diabetes?  No  Would the patient like to be referred to a Nutritionist or for Diabetic Management?  No   Diabetic Exams:  Diabetic Eye Exam: Completed per patient. Will need to request records from [facility].   Diabetic Foot Exam: Pt has been advised about the importance in completing this exam.   How often do you need to have someone help you when you read instructions, pamphlets, or other written materials from your doctor or pharmacy?: 1 - Never  Interpreter Needed?: No  Information entered by :: Clemetine Marker LPN   Activities of Daily Living In your present state of health, do you have any difficulty performing the following activities: 07/03/2019 06/13/2019  Hearing? N N  Comment declines hearing aids -  Vision? N N  Difficulty concentrating or making decisions? Y N  Walking or climbing stairs? Y N  Dressing or bathing? N N  Doing errands, shopping? N N  Preparing Food and eating ? N -  Using the Toilet? N -  In the past six months, have you accidently leaked urine? Y -  Comment wears pads for protection, rarely has leakage -  Do you have problems with loss of bowel control? N -  Managing your Medications? N -  Managing your Finances? N -  Housekeeping or managing your Housekeeping? N -  Some recent data might be hidden     Immunizations and Health Maintenance Immunization History  Administered Date(s) Administered  . Influenza,inj,Quad PF,6+ Mos 03/20/2015, 12/12/2015, 01/20/2017, 02/15/2018, 01/04/2019  . Pneumococcal Conjugate-13 09/13/2013  . Pneumococcal Polysaccharide-23 10/30/2014   Health Maintenance Due  Topic Date Due  . FOOT EXAM  Never done  . OPHTHALMOLOGY EXAM  Never done    Patient Care Team: Danelle Berry, PA-C as PCP - General (Family Medicine)  Indicate any recent Medical Services you may have received from other than [facility] providers in the past year (date may be  approximate).     Assessment:   This is a routine wellness examination for [place].  Hearing/Vision screen  Hearing Screening   125Hz  250Hz  500Hz  1000Hz  2000Hz  3000Hz  4000Hz  6000Hz  8000Hz   Right ear:           Left ear:           Comments: Pt denies hearing difficulty  Vision Screening Comments: [facility]  Dietary issues and exercise activities discussed: Current Exercise Habits: The patient does not participate in regular exercise at present, Exercise limited by: orthopedic condition(s)  Goals   None    Depression Screen PHQ 2/9 Scores 07/03/2019 06/13/2019 04/16/2019  PHQ - 2 Score 0 0 1  PHQ- 9 Score - 0 5    Fall Risk Fall Risk  07/03/2019 06/13/2019 05/18/2019 04/16/2019  Falls in the past year? 1 0 1 1  Number falls in past yr: 1 0 1 0  Injury with Fall? 0 0 0 1  Comment - - - left rib, was xrayed  Risk for fall due to : Impaired balance/gait - - -  Follow up Falls prevention discussed - - -    FALL RISK PREVENTION PERTAINING TO THE HOME:  Any stairs in or around the home? No  If so, do they handrails? No   Home free of loose throw rugs in walkways, pet beds, electrical cords, etc? Yes  Adequate lighting in your home to reduce risk of falls? Yes   ASSISTIVE DEVICES UTILIZED TO PREVENT FALLS:  Life alert? No  Use of a cane, walker or w/c? Yes  Grab bars in the bathroom? Yes  Shower chair or bench in shower? Yes  Elevated toilet seat or a handicapped toilet? Yes   DME ORDERS:  DME order needed?  No   TIMED UP AND GO:  Was the test performed? No . Telephonic visit.   Education: Fall risk prevention has been discussed.  Intervention(s) required? No    Cognitive Function:     6CIT Screen 07/03/2019  What Year? 0 points  What month? 0 points  What time? 0 points  Count back from 20 0 points  Months in reverse 0 points  Repeat phrase 2 points  Total Score 2    Screening Tests Health Maintenance  Topic Date Due    . FOOT EXAM  Never done  . OPHTHALMOLOGY EXAM  Never done  . MAMMOGRAM  04/15/2020 (Originally 04/05/1996)  . DEXA SCAN  04/15/2020 (Originally 04/06/2011)  . COLONOSCOPY  04/15/2020 (Originally 04/05/1996)  . TETANUS/TDAP  04/15/2020 (Originally 04/05/1965)  . HEMOGLOBIN A1C  10/14/2019  . INFLUENZA VACCINE  Completed  . Hepatitis C Screening  Completed  . PNA vac Low Risk Adult  Completed    Qualifies for Shingles Vaccine? Yes  Zostavax. Due for Shingrix. Education has been provided regarding the importance of this vaccine. Pt has been advised to call insurance company to determine out of pocket expense. Advised may also receive vaccine at local pharmacy or Health Dept. Verbalized acceptance and understanding.  Tdap: Although this vaccine is not a covered service during a Wellness Exam, does the patient still wish to receive this vaccine today?  Yes .  Education has been provided regarding the importance of this vaccine. Advised may receive this vaccine at local pharmacy or Health Dept. Aware to provide a copy of the vaccination record if obtained from local pharmacy or Health Dept. Verbalized acceptance and understanding.  Flu Vaccine: Up to date  Pneumococcal Vaccine: Up to date    Cancer Screenings:  Colorectal Screening: Not completed . Pt declines.  Mammogram: DUE. Pt requests to postpone.   Bone Density: DUE.  Pt requests to postpone.  Lung Cancer Screening: (Low Dose CT Chest recommended if Age 76-80 years, 30 pack-year currently smoking OR have quit w/in 15years.) does not qualify.   Additional Screening:  Hepatitis C Screening: does qualify; Completed 02/07/12  Vision Screening: Recommended annual ophthalmology exams for early detection of glaucoma and other disorders of the eye. Is the patient up to date with their annual eye exam?  Yes  Who is the provider or what is the name of the office in which the pt attends annual eye exams? [facility]  Dental Screening:  Recommended annual dental exams for proper oral hygiene  Community Resource Referral:  CRR required this visit?  No      Plan:    I have personally reviewed and addressed the Medicare Annual Wellness questionnaire and have noted the following in the patient's chart:  A. Medical and social history B. Use of alcohol, tobacco or illicit drugs  C. Current medications and supplements D. Functional ability and status E.  Nutritional status F.  Physical activity G. Advance directives H. List of other physicians I.  Hospitalizations, surgeries, and ER visits in previous 12 months J.  Vitals K. Screenings such as hearing and vision if needed, cognitive and depression L. Referrals and appointments   In addition, I have reviewed and discussed with patient certain preventive protocols, quality metrics, and best practice recommendations. A written personalized care plan for preventive services as well as general preventive health recommendations were provided to patient.   Signed,  Reather Littler, LPN Nurse Health Advisor   Nurse Notes: none

## 2019-07-06 ENCOUNTER — Encounter: Payer: Self-pay | Admitting: Family Medicine

## 2019-07-06 ENCOUNTER — Ambulatory Visit: Payer: Medicare Other

## 2019-07-06 ENCOUNTER — Telehealth (INDEPENDENT_AMBULATORY_CARE_PROVIDER_SITE_OTHER): Payer: Medicare Other | Admitting: Family Medicine

## 2019-07-24 ENCOUNTER — Other Ambulatory Visit: Payer: Self-pay

## 2019-07-24 ENCOUNTER — Ambulatory Visit (INDEPENDENT_AMBULATORY_CARE_PROVIDER_SITE_OTHER): Payer: Medicare Other | Admitting: Family Medicine

## 2019-07-24 ENCOUNTER — Encounter: Payer: Self-pay | Admitting: Family Medicine

## 2019-07-24 VITALS — BP 106/58 | HR 92 | Temp 98.1°F | Resp 14 | Ht 66.0 in | Wt 208.8 lb

## 2019-07-24 DIAGNOSIS — E1169 Type 2 diabetes mellitus with other specified complication: Secondary | ICD-10-CM

## 2019-07-24 DIAGNOSIS — Z9181 History of falling: Secondary | ICD-10-CM

## 2019-07-24 DIAGNOSIS — F0280 Dementia in other diseases classified elsewhere without behavioral disturbance: Secondary | ICD-10-CM

## 2019-07-24 DIAGNOSIS — E039 Hypothyroidism, unspecified: Secondary | ICD-10-CM

## 2019-07-24 DIAGNOSIS — I10 Essential (primary) hypertension: Secondary | ICD-10-CM

## 2019-07-24 DIAGNOSIS — E119 Type 2 diabetes mellitus without complications: Secondary | ICD-10-CM | POA: Diagnosis not present

## 2019-07-24 DIAGNOSIS — Z5181 Encounter for therapeutic drug level monitoring: Secondary | ICD-10-CM

## 2019-07-24 DIAGNOSIS — F0150 Vascular dementia without behavioral disturbance: Secondary | ICD-10-CM

## 2019-07-24 DIAGNOSIS — G309 Alzheimer's disease, unspecified: Secondary | ICD-10-CM

## 2019-07-24 DIAGNOSIS — N1831 Chronic kidney disease, stage 3a: Secondary | ICD-10-CM

## 2019-07-24 DIAGNOSIS — K219 Gastro-esophageal reflux disease without esophagitis: Secondary | ICD-10-CM

## 2019-07-24 DIAGNOSIS — N179 Acute kidney failure, unspecified: Secondary | ICD-10-CM

## 2019-07-24 DIAGNOSIS — R269 Unspecified abnormalities of gait and mobility: Secondary | ICD-10-CM

## 2019-07-24 DIAGNOSIS — E782 Mixed hyperlipidemia: Secondary | ICD-10-CM

## 2019-07-24 DIAGNOSIS — R350 Frequency of micturition: Secondary | ICD-10-CM

## 2019-07-24 NOTE — Patient Instructions (Signed)
Blood pressure 100/60 to 140/80 is a good normal range  We don't want your blood pressure to be on the lower end if you feel weak, lightheaded or like you might pass out.  Monitor at home and follow up if you feel any of those symptoms or your Blood Pressure is lower than 100/60 - we will need to see you and change your meds.

## 2019-07-24 NOTE — Progress Notes (Signed)
Name: Katherine Stevens   MRN: [id number]    DOB: 1945-11-28   Date:07/24/2019       Progress Note  Chief Complaint  Patient presents with  . Follow-up  . Hypertension  . Depression  . Diabetes  . Gastroesophageal Reflux  . Hyperlipidemia  . Hypothyroidism    Subjective:   Katherine Stevens is a 74 y.o. female, presents to clinic for routine follow up on the conditions listed above.  Hypothyroid - pt had very high TSH likely due to med non-compliance - pt lives alone had been confused, established here after being lost to f/up with prior PCP, TSH levels at past PCP was much worse but significantly elevated when she first established here in January and has been gradually improving.  Lab Results  Component Value Date   TSH 8.18 (H) 05/18/2019   TSH 41.76 (H) 04/16/2019   After some improvement of TSH in Feb, pt and son reported that meds were making her tired and she didn't want to take them but we did not know what meds she had been compliant with or stopped. She had in office f/up was advised to come for lab work and bring her medications and come with her son but then the appointment was a telephone call which we rescheduled for today. Patient is on levothyroxine   GERD - GI sx improved once she started medicine 2 months ago, we did a follow-up visit about a month ago she was encouraged to continue taking Protonix 20 mg, she reports significant improvement in her abdominal and GI symptoms she is continuing to take daily in the evenings Denies N, V, abd pain, melena, hematochezia, reflux, indigestion  Diabetes Mellitus Type II: Currently managing with metformin, dose recently decreased to 500 mg twice daily, she is very well controlled diabetes and was having a lot of GI symptoms so the medication dosing was decreased this was clarified with her at her last appointment for GERD follow-up this was about a month ago. Pt notes good med compliance - she thinks she is taking it only  once a day, monitoring blood sugar and its good so she decreased meds on her own.   No hypoglycemic episodes - no lows, nothing less than 100 Denies: Polyuria, polyphagia, vision changes, or neuropathy  -she does note polydipsia and nocturia Recent pertinent labs: Lab Results  Component Value Date   HGBA1C 6.1 (H) 04/16/2019   Pt is due today DM foot exam and eye exam ACEI/ARB: Yes Statin: Yes   Urinary frequency night and day, on ditropan xl 5 mg-  OAB? Not seeing specialist, has not mentioned until today She reports urinary frequency throughout the day and multiple times at night waking her up.  She does not know if she is on oxybutynin, she did not bring her medications with her.  She denies any suprapubic abdominal pain, pressure, bladder fullness, flank pain, dysuria, hematuria or urine odor or change in color or smell  Mixed Alzheimers and vascular dementia - saw [facility] earlier this month, office visit notes, labs and medications were reviewed today- recent dose increase in aricept Abnormality of gait and mobility with increased risk for falls -neurology advised HH eval for help with this but it was declined by pt at last visit earlier this month Per Neuro - (reviewed 04/04/2019) Vit B12-306, Vit B1-104.9, folate-14.9, Treponema Pallidum (syphilis) screening cascade-non reactive  Patient also has B12 deficiency she is on a oral B12 supplement.  Which she is compliant with.  She is confused why they want home health evaluations and she also questions why she had a speech therapist and evaluation and she asks "what happened to me" and she also does not know if she had a stroke or what happened to her however I am unclear what she is referring to.  She states that she is referring to what happened to her in the past several months.  When she did first established she had recently been hospitalized and they were concerned that she was very confused and was not safe to live alone.  [facility]  [facility] but was told that she could no longer drive.  She did establish with neurology locally first and then established here afterwards.  At that time her TSH was very high and most of her medications were very confused and her conditions were uncontrolled. Patient does seem more clear and alert and oriented answering questions appropriately and much improved from 3 months ago.  Her son is not with her this visit but he is waiting outside in the car.  Patient is very resistant to having anyone in her home.  And we discussed and reviewed the neurology office visit at length today. Patient additionally does not want to do a sleep study and is confused why they have ordered a sleep study for obstructive sleep apnea. Patient has abnormal balance and gait with multiple falls in the past year.  Neurology instructed her to use her walker for ambulation to avoid falls but she is here with her cane.  I did review with her dementia and how this affects memory calculation her thinking processes but also can affect how her brain controls her body including her movement and coordination.  We discussed fall prevention and risk like hip fractures or other broken bones or injuries that can occur when she is a fall risk or her gait and balance is not very good. Per neurology note she declined home health for speech therapy and cognitive training  MDD: Mood and energy good: Depression screen [facility] 07/24/2019 07/06/2019 07/03/2019  Decreased Interest 0 1 0  Down, Depressed, Hopeless 0 1 0  PHQ - 2 Score 0 2 0  Altered sleeping 0 3 -  Tired, decreased energy 0 3 -  Change in appetite 0 1 -  Feeling bad or failure about yourself  0 0 -  Trouble concentrating 0 3 -  Moving slowly or fidgety/restless 0 0 -  Suicidal thoughts 0 0 -  PHQ-9 Score 0 12 -  Difficult doing work/chores Not difficult at all Not difficult at all -  She is continuing to take  Wellbutrin and Celexa 10 mg and she feels that her moods are good she denies any depression or suicidal thoughts, she does not feel anxious.  Patient Active Problem List   Diagnosis Date Noted  . Gastroesophageal reflux disease without esophagitis 05/18/2019  . Current mild episode of major depressive disorder (HCC) 04/16/2019  . Type 2 diabetes mellitus without complication, without long-term current use of insulin (HCC) 04/16/2019  . Hypothyroidism 04/16/2019  . Hyperlipidemia 04/16/2019  . Cirrhosis of liver without ascites (HCC) 04/16/2019  . Vertigo 04/16/2019  . 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 Folate deficiency 04/16/2019  . Vitamin B1 deficiency 04/16/2019  . Tobacco dependence 12/12/2015  . Osteoarthritis of both knees 08/01/2012  . Bladder spasm 07/28/2012  . Seasonal allergies 07/28/2012  . Chronic hepatitis C  without hepatic coma (HCC) 02/07/2012  . Obesity 07/29/2011    Past Surgical History:  Procedure Laterality Date  . ABDOMINAL HYSTERECTOMY      Family History  Problem Relation Age of Onset  . Heart failure Mother   . Diabetes Mother   . Diabetes Sister   . Hyperlipidemia Sister   . Thyroid disease Sister   . Stroke Brother   . Heart attack Brother   . Cancer Maternal Grandmother   . Heart attack Brother     Social History   Tobacco Use  . Smoking status: Current Every Day Smoker    Packs/day: 0.25    Types: Cigarettes  . Smokeless tobacco: Never Used  . Tobacco comment: 6-7 cigarettes a day  Substance Use Topics  . Alcohol use: Not Currently  . Drug use: Not Currently      Current Outpatient Medications:  .  atorvastatin (LIPITOR) 40 MG tablet, Take 1 tablet (40 mg total) by mouth at bedtime., Disp: 90 tablet, Rfl: 3 .  buPROPion (WELLBUTRIN) 75 MG tablet, Take 2 tablets (150 mg total) by mouth 2 (two) times daily., Disp: 360 tablet, Rfl: 3 .  citalopram (CELEXA) 10 MG tablet, Take 1 tablet (10 mg total) by mouth  daily., Disp: 90 tablet, Rfl: 3 .  donepezil (ARICEPT) 5 MG tablet, Take by mouth., Disp: , Rfl:  .  levothyroxine (SYNTHROID) 125 MCG tablet, Take 1 tablet (125 mcg total) by mouth daily before breakfast., Disp: 90 tablet, Rfl: 3 .  loratadine (CLARITIN) 10 MG tablet, Take 10 mg by mouth daily. , Disp: , Rfl:  .  losartan (COZAAR) 50 MG tablet, Take 1 tablet (50 mg total) by mouth 2 (two) times daily., Disp: 180 tablet, Rfl: 3 .  metFORMIN (GLUCOPHAGE) 500 MG tablet, Take 1 tablet (500 mg total) by mouth 2 (two) times daily with a meal., Disp: 180 tablet, Rfl: 2 .  ondansetron (ZOFRAN ODT) 4 MG disintegrating tablet, Take 1 tablet (4 mg total) by mouth every 8 (eight) hours as needed for nausea or vomiting., Disp: 20 tablet, Rfl: 0 .  oxybutynin (DITROPAN-XL) 5 MG 24 hr tablet, Take 5 mg by mouth at bedtime. , Disp: , Rfl:  .  pantoprazole (PROTONIX) 20 MG tablet, Take 1 tablet (20 mg total) by mouth at bedtime., Disp: 30 tablet, Rfl: 1 .  Travoprost, BAK Free, (TRAVATAN) 0.004 % SOLN ophthalmic solution, 1 drop at bedtime., Disp: , Rfl:  .  vitamin B-12 (CYANOCOBALAMIN) 500 MCG tablet, Take 500 mcg by mouth 2 (two) times daily., Disp: , Rfl:   Allergies  Allergen Reactions  . Lisinopril Anaphylaxis    MOUTH & JAW SWELLING    Chart Review Today: I personally reviewed active problem list, medication list, allergies, family history, social history, health maintenance, notes from last encounter, lab results, imaging with the patient/caregiver today. Extensive review of specialist visits plan lab results etc. through MyChart did review this with the patient today  Review of Systems  10 Systems reviewed and are negative for acute change except as noted in the HPI.  Objective:    Vitals:   07/24/19 1329  BP: (!) 106/58  Pulse: 92  Resp: 14  Temp: 98.1 F (36.7 C)  SpO2: 97%  Weight: 208 lb 12.8 oz (94.7 kg)  Height: 5\' 6"  (1.676 m)    Body mass index is 33.7 kg/m.  Physical  Exam Vitals and nursing note reviewed.  Constitutional:      General: She is not in  acute distress.    Appearance: Normal appearance. She is well-developed. She is obese. She is not ill-appearing, toxic-appearing or diaphoretic.  HENT:     Head: Normocephalic and atraumatic.     Right Ear: External ear normal.     Left Ear: External ear normal.     Nose: Nose normal.     Mouth/Throat:     Mouth: Mucous membranes are moist.     Pharynx: Oropharynx is clear.  Eyes:     General:        Right eye: No discharge.        Left eye: No discharge.     Conjunctiva/sclera: Conjunctivae normal.  Neck:     Trachea: No tracheal deviation.  Cardiovascular:     Rate and Rhythm: Normal rate and regular rhythm.     Pulses: Normal pulses.     Heart sounds: Normal heart sounds. No murmur. No friction rub. No gallop.   Pulmonary:     Effort: Pulmonary effort is normal. No respiratory distress.     Breath sounds: Normal breath sounds. No stridor. No wheezing, rhonchi or rales.  Abdominal:     General: Bowel sounds are normal. There is no distension.     Palpations: Abdomen is soft.     Tenderness: There is no abdominal tenderness. There is no right CVA tenderness, left CVA tenderness, guarding or rebound.  Musculoskeletal:        General: Normal range of motion.     Right lower leg: No edema.     Left lower leg: No edema.  Skin:    General: Skin is warm and dry.     Findings: No rash.  Neurological:     Mental Status: She is alert and oriented to person, place, and time.     Motor: No abnormal muscle tone.     Gait: Gait abnormal.     Comments: Walks with cane  Psychiatric:        Mood and Affect: Mood normal.        Behavior: Behavior normal.       Diabetic Foot Exam: Diabetic Foot Exam - Simple   Simple Foot Form Diabetic Foot exam was performed with the following findings: Yes 07/24/2019  1:45 PM  Visual Inspection Sensation Testing Pulse Check Comments       Fall  Risk: Fall Risk  07/24/2019 07/06/2019 07/03/2019 06/13/2019 05/18/2019  Falls in the past year? 1 1 1  0 1  Number falls in past yr: 1 1 1  0 1  Injury with Fall? 0 0 0 0 0  Comment - - - - -  Risk for fall due to : - - Impaired balance/gait - -  Follow up - - Falls prevention discussed - -    Functional Status Survey: Is the patient deaf or have difficulty hearing?: No Does the patient have difficulty seeing, even when wearing glasses/contacts?: Yes Does the patient have difficulty concentrating, remembering, or making decisions?: No Does the patient have difficulty walking or climbing stairs?: No Does the patient have difficulty dressing or bathing?: No Does the patient have difficulty doing errands alone such as visiting a doctor's office or shopping?: No   Assessment & Plan:     ICD-10-CM   1. Hypothyroidism, unspecified type  E03.9 CBC with Differential/Platelet    TSH   TSH has been high, not well controlled over past three months, recheck TSH today, pt seems to be improving, adjust synthroid dose per lab results  2. Type  2 diabetes mellitus without complication, without long-term current use of insulin (HCC)  E11.9 COMPLETE METABOLIC PANEL WITH GFR    Microalbumin, urine    POCT glycosylated hemoglobin (Hb A1C)    POCT CBG (Fasting - Glucose)   well controlled, pt decreased dose to metformin 500 mg once daily - see if A1C is still at goal of <7.0  3. DM type 2 with diabetic mixed hyperlipidemia (HCC)  E11.69 COMPLETE METABOLIC PANEL WITH GFR   E78.2 Lipid panel    Microalbumin, urine    POCT glycosylated hemoglobin (Hb A1C)    POCT CBG (Fasting - Glucose)   compliant with statin, no SE or concerns, due for FLP and CMP  4. AKI (acute kidney injury) (HCC)  N17.9 COMPLETE METABOLIC PANEL WITH GFR   Decrease in renal function in February with nausea vomiting and GI symptoms those improved but renal function has not returned to baseline  5. Hypertension, unspecified type  I10  COMPLETE METABOLIC PANEL WITH GFR   Pressure low, on losartan 50 mg twice daily patient may also be taking HCTZ 25 mg although this was d/c a few months ago - d/c and monitor BP, f/up  6. Gastroesophageal reflux disease without esophagitis  K21.9    Stable, well-controlled symptoms significantly improved continue Protonix 20 mg daily in the evening  7. Stage 3a chronic kidney disease  N18.31 COMPLETE METABOLIC PANEL WITH GFR   Decreased renal function in the past 3 months recheck today  8. Mixed Alzheimer's and vascular dementia (HCC)  G30.9    F01.50    F02.80    Per neurology, Aricept dose increased, patient refused most of their proposed plan including sleep study home health and PT  9. Abnormality of gait and mobility  R26.9    Per neurology, not using walker but cane we did reviewed their note and plan encouraged her to follow instructions of the specialist  10. At moderate risk for fall  Z91.81    Patient has refused home health and PT encouraged her to work on her mobility and gait so that she can be independent and functional to avoid falls  11. Urinary frequency  R35.0 Urinalysis, Routine w reflex microscopic    Urine Culture   Patient mentions urinary frequency, will test urine but likely overactive bladder with past med history  12. Encounter for medication monitoring  Z51.81 CBC with Differential/Platelet    COMPLETE METABOLIC PANEL WITH GFR    Lipid panel    TSH    Urinalysis, Routine w reflex microscopic    Microalbumin, urine   When patient was leaving she mentioned that she was taking 25 mg of something additional to losartan we had discontinued hydrochlorothiazide 25 mg in January when she first established with [place], she may still have been taking this over the past couple months which would be a possible cause of her lower blood pressure today and continued decreased renal function.  I did write out specifically for her her blood pressure medications today which include  discontinuing hydrochlorothiazide and making sure that she is taking only losartan 50 mg twice daily the way she was previously prescribed.  If her blood pressure continues to be low with only losartan then I would decrease this to 50 mg once daily and would need close follow-up.  I did review thoroughly with her the testing and diagnoses from neurology.  She states that she is willing to do the sleep study if that will help with her dementia and  allow her to be more independent or get back to driving.  I did encourage her to do home health as well to help her be functional healthy and well and improve her balance, gait, mobility, independence.  I have not clear why she has speech therapy ordered from them but I did encourage her to call them and let them know that she is willing to do some of the suggested work-up.  Overall patient appears significantly improved and we have been working hard to control her chronic conditions and minimize medications as much as possible.  I reviewed all of her medications and conditions 1 at a time today including the plan as noted above  She did complain of urinary frequency but I do suspect this may be due to overactive bladder diagnosis which I do not have on the chart has we have not had time to previously discussed this and she has not complained of any urinary symptoms in the past.  Will make sure there is no infection or other causes, we will likely need to follow-up on this next time she comes including her oxybutynin medication and any urology referrals that she may need.  She was instructed to come to her next office visit in 3 months and to bring all of her medications and pillbox with her also bring her glucometer with her  Patient today refuse any vaccinations or diabetic eye exam.  She did allow me to do her diabetic foot exam  Return in about 3 months (around 10/23/2019).   Danelle Berry, PA-C 07/24/19 1:35 PM

## 2019-07-26 ENCOUNTER — Telehealth: Payer: Self-pay | Admitting: Family Medicine

## 2019-07-26 LAB — COMPLETE METABOLIC PANEL WITH GFR
AG Ratio: 1.2 (calc) (ref 1.0–2.5)
ALT: 15 U/L (ref 6–29)
AST: 19 U/L (ref 10–35)
Albumin: 4 g/dL (ref 3.6–5.1)
Alkaline phosphatase (APISO): 108 U/L (ref 37–153)
BUN: 10 mg/dL (ref 7–25)
CO2: 25 mmol/L (ref 20–32)
Calcium: 9.2 mg/dL (ref 8.6–10.4)
Chloride: 104 mmol/L (ref 98–110)
Creat: 0.92 mg/dL (ref 0.60–0.93)
GFR, Est African American: 72 mL/min/{1.73_m2} (ref >=60)
GFR, Est Non African American: 62 mL/min/{1.73_m2} (ref >=60)
Globulin: 3.3 g/dL (calc) (ref 1.9–3.7)
Glucose, Bld: 119 mg/dL — ABNORMAL HIGH (ref 65–99)
Potassium: 4.1 mmol/L (ref 3.5–5.3)
Sodium: 138 mmol/L (ref 135–146)
Total Bilirubin: 0.3 mg/dL (ref 0.2–1.2)
Total Protein: 7.3 g/dL (ref 6.1–8.1)

## 2019-07-26 LAB — TEST AUTHORIZATION

## 2019-07-26 LAB — LIPID PANEL
Cholesterol: 108 mg/dL (ref <200)
HDL: 54 mg/dL (ref >=50)
LDL Cholesterol (Calc): 39 mg/dL (calc)
Non-HDL Cholesterol (Calc): 54 mg/dL (calc) (ref <130)
Total CHOL/HDL Ratio: 2 (calc) (ref <5.0)
Triglycerides: 71 mg/dL (ref <150)

## 2019-07-26 LAB — CBC WITH DIFFERENTIAL/PLATELET
Absolute Monocytes: 790 cells/uL (ref 200–950)
Basophils Absolute: 47 cells/uL (ref 0–200)
Basophils Relative: 0.5 %
Eosinophils Absolute: 169 cells/uL (ref 15–500)
Eosinophils Relative: 1.8 %
HCT: 37.4 % (ref 35.0–45.0)
Hemoglobin: 12.3 g/dL (ref 11.7–15.5)
Lymphs Abs: 3450 cells/uL (ref 850–3900)
MCH: 28.9 pg (ref 27.0–33.0)
MCHC: 32.9 g/dL (ref 32.0–36.0)
MCV: 88 fL (ref 80.0–100.0)
MPV: 9.9 fL (ref 7.5–12.5)
Monocytes Relative: 8.4 %
Neutro Abs: 4944 cells/uL (ref 1500–7800)
Neutrophils Relative %: 52.6 %
Platelets: 303 10*3/uL (ref 140–400)
RBC: 4.25 10*6/uL (ref 3.80–5.10)
RDW: 13.1 % (ref 11.0–15.0)
Total Lymphocyte: 36.7 %
WBC: 9.4 10*3/uL (ref 3.8–10.8)

## 2019-07-26 LAB — HEMOGLOBIN A1C
Hgb A1c MFr Bld: 5.6 % of total Hgb (ref <5.7)
Mean Plasma Glucose: 114 (calc)
eAG (mmol/L): 6.3 (calc)

## 2019-07-26 LAB — TSH: TSH: 0.02 mIU/L — ABNORMAL LOW (ref 0.40–4.50)

## 2019-07-26 NOTE — Telephone Encounter (Signed)
Patient is asking should she take the Covid shot and is needing someone to call her about her lab results at her home number.

## 2019-07-27 LAB — URINALYSIS, ROUTINE W REFLEX MICROSCOPIC
Bilirubin Urine: NEGATIVE
Glucose, UA: NEGATIVE
Hgb urine dipstick: NEGATIVE
Ketones, ur: NEGATIVE
Leukocytes,Ua: NEGATIVE
Nitrite: NEGATIVE
Protein, ur: NEGATIVE
Specific Gravity, Urine: 1.014 (ref 1.001–1.03)
pH: 7 (ref 5.0–8.0)

## 2019-07-27 LAB — MICROALBUMIN, URINE: Microalb, Ur: 0.7 mg/dL

## 2019-07-27 LAB — URINE CULTURE
MICRO NUMBER:: 10395890
SPECIMEN QUALITY:: ADEQUATE

## 2019-07-31 NOTE — Progress Notes (Signed)
Appt rescheduled

## 2019-08-10 ENCOUNTER — Telehealth: Payer: Self-pay | Admitting: Family Medicine

## 2019-08-10 DIAGNOSIS — R112 Nausea with vomiting, unspecified: Secondary | ICD-10-CM

## 2019-08-10 MED ORDER — PANTOPRAZOLE SODIUM 20 MG PO TBEC: 20.0000 mg | DELAYED_RELEASE_TABLET | Freq: Every day | ORAL | 3 refills | Status: DC

## 2019-08-10 NOTE — Telephone Encounter (Signed)
Pt request refill  pantoprazole (PROTONIX) 20 MG tablet  [facility] [address] ([address]), [place] - [address] Phone:  [phone]  Fax:  [phone]

## 2019-08-15 ENCOUNTER — Ambulatory Visit: Payer: Medicare Other | Admitting: Family Medicine

## 2019-08-22 ENCOUNTER — Other Ambulatory Visit: Payer: Self-pay

## 2019-08-22 ENCOUNTER — Telehealth: Payer: Self-pay

## 2019-08-22 DIAGNOSIS — K760 Fatty (change of) liver, not elsewhere classified: Secondary | ICD-10-CM

## 2019-08-22 NOTE — Telephone Encounter (Signed)
Left vm informing pt we have scheduled her for an MRI liver per Dr. Annabell Sabal request during her office visit on 06/21/19.   Pt scheduled on Thursday, June 3rd at 3:00pm. Advised pt of the date, time, location and prep. Requested pt to return my call to confirm this information.

## 2019-08-22 NOTE — Telephone Encounter (Signed)
-----   Message from Rayann Heman, CMA sent at 06/22/2019  2:35 PM EDT ----- Call pt and schedule MRI liver and AFP.

## 2019-08-27 ENCOUNTER — Other Ambulatory Visit: Payer: Self-pay

## 2019-08-27 DIAGNOSIS — K7460 Unspecified cirrhosis of liver: Secondary | ICD-10-CM

## 2019-08-27 NOTE — Telephone Encounter (Signed)
Contacted pt today and was able to give MRI information. Changed MRI appt to 12:00 due to transportation issue. Pt aware [facility] [place] [facility]

## 2019-09-06 ENCOUNTER — Ambulatory Visit
Admission: RE | Admit: 2019-09-06 | Discharge: 2019-09-06 | Disposition: A | Discharge status: Home / Self Care | Payer: Medicare Other | Source: Ambulatory Visit | Attending: Gastroenterology | Admitting: Gastroenterology

## 2019-09-06 ENCOUNTER — Ambulatory Visit (INDEPENDENT_AMBULATORY_CARE_PROVIDER_SITE_OTHER): Payer: Medicare Other | Admitting: Family Medicine

## 2019-09-06 ENCOUNTER — Encounter: Payer: Self-pay | Admitting: Family Medicine

## 2019-09-06 ENCOUNTER — Ambulatory Visit: Payer: Medicare Other

## 2019-09-06 ENCOUNTER — Other Ambulatory Visit: Payer: Self-pay

## 2019-09-06 VITALS — BP 122/62 | HR 83 | Temp 97.8°F | Resp 14 | Ht 66.0 in | Wt 206.4 lb

## 2019-09-06 DIAGNOSIS — E119 Type 2 diabetes mellitus without complications: Secondary | ICD-10-CM | POA: Diagnosis not present

## 2019-09-06 DIAGNOSIS — E039 Hypothyroidism, unspecified: Secondary | ICD-10-CM | POA: Diagnosis not present

## 2019-09-06 DIAGNOSIS — K760 Fatty (change of) liver, not elsewhere classified: Secondary | ICD-10-CM | POA: Diagnosis present

## 2019-09-06 DIAGNOSIS — Z7189 Other specified counseling: Secondary | ICD-10-CM

## 2019-09-06 DIAGNOSIS — I10 Essential (primary) hypertension: Secondary | ICD-10-CM | POA: Diagnosis not present

## 2019-09-06 DIAGNOSIS — Z7185 Encounter for immunization safety counseling: Secondary | ICD-10-CM

## 2019-09-06 LAB — TSH: TSH: 0.01 mIU/L — ABNORMAL LOW (ref 0.40–4.50)

## 2019-09-06 MED ORDER — GADOBUTROL 1 MMOL/ML IV SOLN
9.0000 mL | Freq: Once | INTRAVENOUS | Status: AC | PRN
  Administered 2019-09-06: 9 mL via INTRAVENOUS

## 2019-09-06 MED ORDER — METFORMIN HCL 500 MG PO TABS: 500.0000 mg | ORAL_TABLET | Freq: Every day | ORAL | 3 refills | Status: DC

## 2019-09-06 NOTE — Progress Notes (Signed)
Name: Katherine Stevens   MRN: [id number]    DOB: 03/14/1946   Date:09/06/2019       Progress Note  Chief Complaint  Patient presents with  . Follow-up    6 weeks  . Hypothyroidism    abnormal labs  . Hypertension    BP soft last visit     Subjective:   Katherine CakeShirley Tamburro is a 74 y.o. female, presents to clinic for routine follow up on the conditions listed above.  Hypothyroid: Patient here for follow-up on her hypothyroid and levothyroxine dosing, last visit labs were abnormally low Lab Results  Component Value Date   TSH 0.02 (L) 07/24/2019  08/01/2019 HPI: Hypothyroid - pt had very high TSH likely due to med non-compliance - pt lives alone had been confused, established here after being lost to f/up with prior PCP, TSH levels at past PCP was much worse but significantly elevated when she first established here in January and has been gradually improving.  Recent Labs       Lab Results  Component Value Date   TSH 8.18 (H) 05/18/2019   TSH 41.76 (H) 04/16/2019     After some improvement of TSH in Feb, pt and son reported that meds were making her tired and she didn't want to take them but we did not know what meds she had been compliant with or stopped. She had in office f/up was advised to come for lab work and bring her medications and come with her son but then the appointment was a telephone call which we rescheduled for today. Patient is on levothyroxine  She transferred to our clinic and establish care with very uncontrolled hypothyroid likely medical noncompliance, after working on her med compliance for several months TSH improved but now with recent visit was abnormally low.  Patient finally felt much better and did not have any more fatigue or weight gain.  She was called with lab results in April and was instructed to continue the same levothyroxine dose of 125 mcg to continue to take 1 pill daily except for 1 day a week to take half of a pill.  Today she does not  seem that she has done that at all and is seems a little confused.  She may have continue to take 1 pill daily since her last office visit.  She denies any weight changes, palpitations, hot flashes, cold intolerance, swelling, depressed mood, increased weight, constipation. We will recheck TSH today and I discussed with her the available doses for her medication.  She only has a few pills left, if dose needs to be decreased we will send in the next lowest dose which is 112 mcg  F/up on HTN: Last office visit her blood pressure was a little bit soft.   BP Readings from Last 3 Encounters:  09/06/19 122/62  07/24/19 (!) 106/58  06/21/19 121/71  She was given the following instructions: Blood pressure 100/60 to 140/80 is a good normal range  We don't want your blood pressure to be on the lower end if you feel weak, lightheaded or like you might pass out.  Monitor at home and follow up if you feel any of those symptoms or your Blood Pressure is lower than 100/60 - we will need to see you and change your meds.  She has been checking her BP at home since last visit about 6 weeks ago.  SBP runs around 120's, she denies any systolic blood pressure readings lower than 100 and denies  any near syncopal episodes, weakness fatigue, lightheadedness, dizziness.  She has continued to take losartan twice daily.  Hydrochlorothiazide was previously discontinued.  Blood pressure is at goal today no longer soft and patient feels good.  DM well controlled, reviewed her past labs and her medication bottles that she has with her today.  Pt should be taking one 500 mg dose of metformi, due to very well controlled diabetes with prior and recent A1c of 6.1 and also recent GI side effects that did seem to improve when decreasing Metformin dosing and frequency  Patient Active Problem List   Diagnosis Date Noted  . Gastroesophageal reflux disease without esophagitis 05/18/2019  . Current mild episode of major depressive  disorder (HCC) 04/16/2019  . Type 2 diabetes mellitus without complication, without long-term current use of insulin (HCC) 04/16/2019  . Hypothyroidism 04/16/2019  . Hyperlipidemia 04/16/2019  . Cirrhosis of liver without ascites (HCC) 04/16/2019  . Vertigo 04/16/2019  . 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 Folate deficiency 04/16/2019  . Vitamin B1 deficiency 04/16/2019  . Tobacco dependence 12/12/2015  . Osteoarthritis of both knees 08/01/2012  . Bladder spasm 07/28/2012  . Seasonal allergies 07/28/2012  . Chronic hepatitis C without hepatic coma (HCC) 02/07/2012  . Obesity 07/29/2011    Past Surgical History:  Procedure Laterality Date  . ABDOMINAL HYSTERECTOMY      Family History  Problem Relation Age of Onset  . Heart failure Mother   . Diabetes Mother   . Diabetes Sister   . Hyperlipidemia Sister   . Thyroid disease Sister   . Stroke Brother   . Heart attack Brother   . Cancer Maternal Grandmother   . Heart attack Brother     Social History   Tobacco Use  . Smoking status: Current Every Day Smoker    Packs/day: 0.25    Types: Cigarettes  . Smokeless tobacco: Never Used  . Tobacco comment: 6-7 cigarettes a day  Substance Use Topics  . Alcohol use: Not Currently  . Drug use: Not Currently      Current Outpatient Medications:  .  atorvastatin (LIPITOR) 40 MG tablet, Take 1 tablet (40 mg total) by mouth at bedtime., Disp: 90 tablet, Rfl: 3 .  buPROPion (WELLBUTRIN) 75 MG tablet, Take 2 tablets (150 mg total) by mouth 2 (two) times daily., Disp: 360 tablet, Rfl: 3 .  citalopram (CELEXA) 10 MG tablet, Take 1 tablet (10 mg total) by mouth daily., Disp: 90 tablet, Rfl: 3 .  donepezil (ARICEPT) 5 MG tablet, Take 10 mg by mouth. , Disp: , Rfl:  .  levothyroxine (SYNTHROID) 125 MCG tablet, Take 1 tablet (125 mcg total) by mouth daily before breakfast., Disp: 90 tablet, Rfl: 3 .  loratadine (CLARITIN) 10 MG tablet, Take 10 mg by mouth daily. ,  Disp: , Rfl:  .  losartan (COZAAR) 50 MG tablet, Take 1 tablet (50 mg total) by mouth 2 (two) times daily., Disp: 180 tablet, Rfl: 3 .  metFORMIN (GLUCOPHAGE) 500 MG tablet, Take 1 tablet (500 mg total) by mouth 2 (two) times daily with a meal. (Patient taking differently: Take 500 mg by mouth daily with breakfast. ), Disp: 180 tablet, Rfl: 2 .  oxybutynin (DITROPAN-XL) 5 MG 24 hr tablet, Take 5 mg by mouth at bedtime. , Disp: , Rfl:  .  pantoprazole (PROTONIX) 20 MG tablet, Take 1 tablet (20 mg total) by mouth at bedtime., Disp: 90 tablet, Rfl: 3 .  Travoprost, BAK Free, (TRAVATAN) 0.004 %  SOLN ophthalmic solution, 1 drop at bedtime., Disp: , Rfl:  .  vitamin B-12 (CYANOCOBALAMIN) 500 MCG tablet, Take 500 mcg by mouth 2 (two) times daily., Disp: , Rfl:   Allergies  Allergen Reactions  . Lisinopril Anaphylaxis    MOUTH & JAW SWELLING    Chart Review Today: I personally reviewed active problem list, medication list, allergies, family history, social history, health maintenance, notes from last encounter, lab results, imaging with the patient/caregiver today.   Review of Systems  10 Systems reviewed and are negative for acute change except as noted in the HPI.  Objective:    Vitals:   09/06/19 1043  BP: 122/62  Pulse: 83  Resp: 14  Temp: 97.8 F (36.6 C)  SpO2: 95%  Weight: 206 lb 6.4 oz (93.6 kg)  Height: 5\' 6"  (1.676 m)    Body mass index is 33.31 kg/m.  Physical Exam Vitals and nursing note reviewed.  Constitutional:      General: She is not in acute distress.    Appearance: Normal appearance. She is obese. She is not ill-appearing, toxic-appearing or diaphoretic.  HENT:     Head: Normocephalic and atraumatic.     Right Ear: External ear normal.     Left Ear: External ear normal.  Eyes:     General:        Right eye: No discharge.        Left eye: No discharge.     Conjunctiva/sclera: Conjunctivae normal.  Cardiovascular:     Rate and Rhythm: Normal rate and  regular rhythm.     Pulses: Normal pulses.     Heart sounds: Normal heart sounds.  Pulmonary:     Effort: Pulmonary effort is normal. No respiratory distress.     Breath sounds: Normal breath sounds. No stridor. No wheezing or rhonchi.  Abdominal:     General: Bowel sounds are normal. There is no distension.     Palpations: Abdomen is soft.  Musculoskeletal:     Right lower leg: No edema.     Left lower leg: No edema.  Neurological:     Mental Status: She is alert.     Gait: Gait abnormal.  Psychiatric:        Mood and Affect: Mood normal.        Behavior: Behavior normal.       PHQ2/9: Depression screen [facility] 2/9 09/06/2019 07/24/2019 07/06/2019 07/03/2019 06/13/2019  Decreased Interest 0 0 1 0 0  Down, Depressed, Hopeless 0 0 1 0 0  PHQ - 2 Score 0 0 2 0 0  Altered sleeping 0 0 3 - 0  Tired, decreased energy 0 0 3 - 0  Change in appetite 0 0 1 - 0  Feeling bad or failure about yourself  0 0 0 - 0  Trouble concentrating 0 0 3 - 0  Moving slowly or fidgety/restless 0 0 0 - 0  Suicidal thoughts 0 0 0 - 0  PHQ-9 Score 0 0 12 - 0  Difficult doing work/chores Not difficult at all Not difficult at all Not difficult at all - Not difficult at all    phq 9 is negative, reviewed today  Fall Risk: Fall Risk  09/06/2019 07/24/2019 07/06/2019 07/03/2019 06/13/2019  Falls in the past year? 0 1 1 1  0  Number falls in past yr: 0 1 1 1  0  Injury with Fall? 0 0 0 0 0  Comment - - - - -  Risk for fall due to : - - -  Impaired balance/gait -  Follow up - - - Falls prevention discussed -    Functional Status Survey: Is the patient deaf or have difficulty hearing?: No Does the patient have difficulty seeing, even when wearing glasses/contacts?: No Does the patient have difficulty concentrating, remembering, or making decisions?: No Does the patient have difficulty walking or climbing stairs?: No Does the patient have difficulty dressing or bathing?: No Does the patient have difficulty doing errands  alone such as visiting a doctor's office or shopping?: No   Assessment & Plan:   1. Hypothyroidism, unspecified type Last TSH was abnormally low after spending several months trying to get patient's TSH from being extremely high, suspected poor med compliance.  Patient feels very well, unfortunately she did not adjust her levothyroxine dosing at home the way that she was instructed to.  She has been dealing with some cognitive decline and memory problems some days when she is here she appears much sharper than other days.  She has had a few appointments in the last couple months where she was supposed to come with somebody but she came by herself or she was instructed specifically to come in person and she did a video call or had no other family members with her to help with the history and checking medications and dosing etc. In the future will change her dose of the pill and keep it very simple with once daily dosing and will try to avoid any dose changes with half pills etc. I explained this to her today.  If her thyroid labs are in normal range we will continue 125 mcg daily, if TSH is abnormally low again we will likely decrease the dose to 112 mcg daily. - TSH  2. Essential hypertension Last office visit her blood pressure was slightly soft, she has monitored at home as she was instructed, systolic blood pressures tend to be in the 130s she states today, her blood pressure is well controlled today of no concerns, we previously discontinued hydrochlorothiazide, will continue her losartan the way she has been taking it -50 mg twice daily  3. Type 2 diabetes mellitus without complication, without long-term current use of insulin (Success) Reviewed with the patient her past labs and her medication dosing Prescription and med list updated and reviewed with patient she was taking 500 mg Metformin only once a day with very well-controlled A1c and diabetes - metFORMIN (GLUCOPHAGE) 500 MG tablet; Take 1  tablet (500 mg total) by mouth daily.  Dispense: 90 tablet; Refill: 3  4. Vaccine counseling Patient had multiple questions today regarding Covid vaccination side effects and reactions.  We discussed at length today.  Patient has no contraindications, we discussed the risk versus benefit.  I did express my medical recommendation that she should get the Covid vaccination due to significant benefit to her and very minimal side effects or risk.  I did encourage her to research the vaccines herself make that choice but I do feel that with her age and in view of her conditions though she is well controlled, she is a patient who is at much higher risk of Covid complications if she should get Covid and the benefits, in my opinion, far outweigh the risk of a safe and effective vaccine.  She has continued to avoid any family gatherings and birthday parties because she is not vaccinated, encouraged her to spend some more time thinking about the protection and the relief that the vaccination would give her and allow her to resume  interacting with her family and grandchildren with much lower risk than she currently has.   Return for 3 month f/up on HTN, HLD, thyroid, GERD.   Danelle Berry, PA-C 09/06/19 11:12 AM

## 2019-09-06 NOTE — Patient Instructions (Signed)
Metformin 500 mg once a day  We will check your thyroid labs and call you about your levothyroxine medications

## 2019-09-07 ENCOUNTER — Other Ambulatory Visit: Payer: Self-pay | Admitting: Family Medicine

## 2019-09-07 ENCOUNTER — Telehealth: Payer: Self-pay

## 2019-09-07 DIAGNOSIS — E039 Hypothyroidism, unspecified: Secondary | ICD-10-CM

## 2019-09-07 MED ORDER — LEVOTHYROXINE SODIUM 112 MCG PO TABS: 112.0000 ug | ORAL_TABLET | Freq: Every day | ORAL | 3 refills | Status: DC

## 2019-09-07 NOTE — Telephone Encounter (Signed)
-----   Message from Danelle Berry, [place] sent at 09/07/2019 12:22 AM EDT ----- Please notify pt of labwork  Thyroid lab is still abnormally low stop the 125 mcg levothyroxine and start the 112 mcg it was sent to pharmacy  Patient will need follow-up TSH labs in 6 weeks please order Asher Muir and notify pt of 6 week date for f/up lab

## 2019-09-13 ENCOUNTER — Telehealth: Payer: Self-pay

## 2019-09-13 NOTE — Telephone Encounter (Signed)
-----   Message from Midge Minium, MD sent at 09/11/2019 10:34 AM EDT ----- Let the patient know that the MRI did not show any sign of cirrhosis or masses.  No further follow-up is needed since there is no sign of cirrhosis.

## 2019-09-13 NOTE — Telephone Encounter (Signed)
Pt notified of MRI results

## 2019-10-19 ENCOUNTER — Telehealth: Payer: Self-pay

## 2019-10-19 LAB — TSH: TSH: 0.01 mIU/L — ABNORMAL LOW (ref 0.40–4.50)

## 2019-10-19 NOTE — Telephone Encounter (Signed)
Spoke with pharmacist and was informed patient picked up Levothyroxine 112 mcg #90 on 09/07/2019. Still waiting for patient to call back to give results of TSH.

## 2019-10-23 ENCOUNTER — Ambulatory Visit: Payer: Medicare Other | Admitting: Family Medicine

## 2019-11-28 NOTE — Progress Notes (Signed)
Name: Katherine Stevens   MRN: [id number]    DOB: 27-Aug-1945   Date:11/29/2019       Progress Note  Chief Complaint  Patient presents with  . Follow-up  . Diabetes  . Hypothyroidism  . Hypertension  . Hyperlipidemia  . Gastroesophageal Reflux     Subjective:   Katherine Stevens is a 74 y.o. female, presents to clinic for routine f/up  Hyperlipidemia: Currently treated with Atorvastatin 40 mg daily, pt reports good med compliance Last Lipids: Lab Results  Component Value Date   CHOL 108 07/24/2019   HDL 54 07/24/2019   LDLCALC 39 07/24/2019   TRIG 71 07/24/2019   CHOLHDL 2.0 07/24/2019  Last labs well controlled - Denies: Chest pain, shortness of breath, myalgias, claudication   DM:   Pt managing DM with Metformin 500 mg once daily Reports good med compliance Pt has noSE from meds. Blood sugars 130-140 Denies: Polyuria, polydipsia, vision changes, neuropathy, hypoglycemia Recent pertinent labs:  DM has been well controlled Lab Results  Component Value Date   HGBA1C 5.6 07/24/2019   HGBA1C 6.1 (H) 04/16/2019   Standard of care and health maintenance: Urine Microalbumin:  04/17/19 Foot exam:  07/24/19 DM eye exam:  07/05/19 ACEI/ARB:  Losartan Statin:  Atorvastatin  Hypertension:  Currently managed on Losartan 50 mg BID - BP at home is good 120's, sometimes she will skip doses or cut meds in half She denies any SE.   Blood pressure today is well controlled. BP Readings from Last 3 Encounters:  11/29/19 124/76  09/06/19 122/62  07/24/19 (!) 106/58  Pt denies CP, SOB, exertional sx, LE edema, palpitation, Ha's, visual disturbances, lightheadedness, hypotension, syncope.  Hypothyroidism: Current Medication Regimen: 112 mcg synthroid once daily Takes medicine in the morning Current Symptoms:  denies fatigue, weight gain, feeling cold and cold intolerance, swelling, feeling slow, losing hair, feeling excessive energy, tremulousness, palpitations, sweating and  weight loss Most recent results are below; we will be repeating labs today. Lab Results  Component Value Date   TSH 0.01 (L) 10/18/2019   Thyroid has been difficult to get stable - initally TSH was very high - pt improved med compliance over the apst 6-8 months, but now TSH too low, have asked pt to decrease dose of meds to 112 mcg daily, last dose was 125 mcg daily which was decreased to daily except half done once a week - labs continued to be abnormal.  However pt clinically looks and felt good.  Current smoker Smoking cessation instruction/counseling given:  counseled patient on the dangers of tobacco use, advised patient to stop smoking, and reviewed strategies to maximize success Started when she was 74 y/o  off and on for her whole life  She is still having some sour stomach and nausea when she wakes up in the morning - taking protonix but out of zofran, no vomiting, pain with eating, stomach cramps    Current Outpatient Medications:  .  atorvastatin (LIPITOR) 40 MG tablet, Take 1 tablet (40 mg total) by mouth at bedtime., Disp: 90 tablet, Rfl: 3 .  buPROPion (WELLBUTRIN) 75 MG tablet, Take 2 tablets (150 mg total) by mouth 2 (two) times daily., Disp: 360 tablet, Rfl: 3 .  citalopram (CELEXA) 10 MG tablet, Take 1 tablet (10 mg total) by mouth daily., Disp: 90 tablet, Rfl: 3 .  donepezil (ARICEPT) 5 MG tablet, Take 10 mg by mouth. , Disp: , Rfl:  .  levothyroxine (SYNTHROID) 112 MCG tablet, Take 1 tablet (112  mcg total) by mouth daily before breakfast., Disp: 90 tablet, Rfl: 3 .  loratadine (CLARITIN) 10 MG tablet, Take 10 mg by mouth daily. , Disp: , Rfl:  .  losartan (COZAAR) 50 MG tablet, Take 1 tablet (50 mg total) by mouth 2 (two) times daily., Disp: 180 tablet, Rfl: 3 .  metFORMIN (GLUCOPHAGE) 500 MG tablet, Take 1 tablet (500 mg total) by mouth daily., Disp: 90 tablet, Rfl: 3 .  pantoprazole (PROTONIX) 20 MG tablet, Take 1 tablet (20 mg total) by mouth at bedtime., Disp: 90  tablet, Rfl: 3 .  Travoprost, BAK Free, (TRAVATAN) 0.004 % SOLN ophthalmic solution, 1 drop at bedtime., Disp: , Rfl:  .  vitamin B-12 (CYANOCOBALAMIN) 500 MCG tablet, Take 500 mcg by mouth 2 (two) times daily., Disp: , Rfl:   Patient Active Problem List   Diagnosis Date Noted  . Hypertension 09/06/2019  . Gastroesophageal reflux disease without esophagitis 05/18/2019  . Current mild episode of major depressive disorder (HCC) 04/16/2019  . Type 2 diabetes mellitus without complication, without long-term current use of insulin (HCC) 04/16/2019  . Hypothyroidism 04/16/2019  . Hyperlipidemia 04/16/2019  . Cirrhosis of liver without ascites (HCC) 04/16/2019  . Vertigo 04/16/2019  . 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 Folate deficiency 04/16/2019  . Vitamin B1 deficiency 04/16/2019  . Tobacco dependence 12/12/2015  . Osteoarthritis of both knees 08/01/2012  . Bladder spasm 07/28/2012  . Seasonal allergies 07/28/2012  . Chronic hepatitis C without hepatic coma (HCC) 02/07/2012  . Obesity 07/29/2011    Past Surgical History:  Procedure Laterality Date  . ABDOMINAL HYSTERECTOMY      Family History  Problem Relation Age of Onset  . Heart failure Mother   . Diabetes Mother   . Diabetes Sister   . Hyperlipidemia Sister   . Thyroid disease Sister   . Stroke Brother   . Heart attack Brother   . Cancer Maternal Grandmother   . Heart attack Brother     Social History   Tobacco Use  . Smoking status: Current Every Day Smoker    Packs/day: 0.25    Types: Cigarettes  . Smokeless tobacco: Never Used  . Tobacco comment: 6-7 cigarettes a day  Vaping Use  . Vaping Use: Never used  Substance Use Topics  . Alcohol use: Not Currently  . Drug use: Not Currently     Allergies  Allergen Reactions  . Lisinopril Anaphylaxis    MOUTH & JAW SWELLING    Health Maintenance  Topic Date Due  . INFLUENZA VACCINE  11/04/2019  . COVID-19 Vaccine (1) 12/15/2019  (Originally 04/05/1957)  . MAMMOGRAM  04/15/2020 (Originally 04/05/1996)  . DEXA SCAN  04/15/2020 (Originally 04/06/2011)  . COLONOSCOPY  04/15/2020 (Originally 04/05/1996)  . TETANUS/TDAP  04/15/2020 (Originally 04/05/1965)  . HEMOGLOBIN A1C  01/23/2020  . OPHTHALMOLOGY EXAM  07/04/2020  . FOOT EXAM  07/23/2020  . Hepatitis C Screening  Completed  . PNA vac Low Risk Adult  Completed    Chart Review Today: I personally reviewed active problem list, medication list, allergies, family history, social history, health maintenance, notes from last encounter, lab results, imaging with the patient/caregiver today.   Review of Systems  10 Systems reviewed and are negative for acute change except as noted in the HPI.  Objective:   Vitals:   11/29/19 1043  BP: 124/76  Pulse: 88  Resp: 16  Temp: 99 F (37.2 C)  TempSrc: Oral  SpO2: 99%  Weight: 206 lb  8 oz (93.7 kg)  Height: 5\' 6"  (1.676 m)    Body mass index is 33.33 kg/m.  Physical Exam Vitals and nursing note reviewed.  Constitutional:      General: She is not in acute distress.    Appearance: Normal appearance. She is well-developed. She is not ill-appearing, toxic-appearing or diaphoretic.     Interventions: Face mask in place.  HENT:     Head: Normocephalic and atraumatic.     Right Ear: External ear normal.     Left Ear: External ear normal.  Eyes:     General: Lids are normal. No scleral icterus.       Right eye: No discharge.        Left eye: No discharge.     Conjunctiva/sclera: Conjunctivae normal.  Neck:     Trachea: Phonation normal. No tracheal deviation.  Cardiovascular:     Rate and Rhythm: Normal rate and regular rhythm.     Pulses: Normal pulses.          Radial pulses are 2+ on the right side and 2+ on the left side.       Posterior tibial pulses are 2+ on the right side and 2+ on the left side.     Heart sounds: Normal heart sounds. No murmur heard.  No friction rub. No gallop.   Pulmonary:     Effort:  Pulmonary effort is normal. No respiratory distress.     Breath sounds: Normal breath sounds. No stridor. No wheezing, rhonchi or rales.  Chest:     Chest wall: No tenderness.  Abdominal:     General: Bowel sounds are normal. There is no distension.     Palpations: Abdomen is soft.     Tenderness: There is no abdominal tenderness.  Musculoskeletal:     Right lower leg: No edema.     Left lower leg: No edema.  Skin:    General: Skin is warm and dry.     Coloration: Skin is not jaundiced or pale.     Findings: No rash.  Neurological:     Mental Status: She is alert. Mental status is at baseline.     Motor: No abnormal muscle tone.     Gait: Gait abnormal (walks with cane).  Psychiatric:        Mood and Affect: Mood normal.        Speech: Speech normal.        Behavior: Behavior normal.     Recent Results (from the past 2160 hour(s))  TSH     Status: Abnormal   Collection Time: 09/06/19 11:28 AM  Result Value Ref Range   TSH 0.01 (L) 0.40 - 4.50 mIU/L  TSH     Status: Abnormal   Collection Time: 10/18/19 11:01 AM  Result Value Ref Range   TSH 0.01 (L) 0.40 - 4.50 mIU/L     Assessment & Plan:   1. Hypothyroidism, unspecified type On 112 levothyroxine dose daily - recheck TSH, last was abnormally low, have been decreasing dose of meds over the past 2-3 months Clinically no sx, she is much improved compared to when she was off meds and was confused, swollen, gaining weight, etc - TSH  2. Essential hypertension Well controlled, stable  She came from past PCP with losartan 50 mg BID, I discussed with her today that this dosing is unusual and we can try and change her to 100 mg once daily - she then noted that she sometimes decreases her  dose Plan to change meds to losartan 50 mg once daily and she will monitor BP at home - AVS included how to check bp and BP log - I gave parameters to pt, OK with BP ranging 120-140/70-90, she will need to follow up if out of range - losartan  (COZAAR) 50 MG tablet; Take 1 tablet (50 mg total) by mouth daily.  Dispense: 90 tablet; Refill: 3  3. Type 2 diabetes mellitus without complication, without long-term current use of insulin (HCC) Well controlled DM, A1C has been at goal and pt taking 500 mg metformin once daily   4. Gastroesophageal reflux disease without esophagitis Some sx in am upon waking - continue PPI in evening  5. Current mild episode of major depressive disorder, unspecified whether recurrent (HCC) Stable mood on celexa and wellbutrin  7. Hyperlipidemia, unspecified hyperlipidemia type Compliant with meds, no SE, no myalgias, fatigue or jaundice - continue atorvastatin - needs to pick up her refills at pharmacy Diet and exercise recommendations for HLD reviewed and handout given  8. Encounter for medication monitoring TSH repeated today   Reviewed all the pts med and dosing again with her - wrote out the indication for each med and reviewed when she is taking - evening, morning or BID etc.  Need f/up on HTN if BP monitoring is out of range  Return in about 6 months (around 05/31/2020) for Routine follow-up.   Danelle Berry, PA-C 11/29/19 10:57 AM

## 2019-11-29 ENCOUNTER — Ambulatory Visit (INDEPENDENT_AMBULATORY_CARE_PROVIDER_SITE_OTHER): Payer: Medicare Other | Admitting: Family Medicine

## 2019-11-29 ENCOUNTER — Other Ambulatory Visit: Payer: Self-pay

## 2019-11-29 ENCOUNTER — Encounter: Payer: Self-pay | Admitting: Family Medicine

## 2019-11-29 VITALS — BP 124/76 | HR 88 | Temp 99.0°F | Resp 16 | Ht 66.0 in | Wt 206.5 lb

## 2019-11-29 DIAGNOSIS — I10 Essential (primary) hypertension: Secondary | ICD-10-CM | POA: Diagnosis not present

## 2019-11-29 DIAGNOSIS — E039 Hypothyroidism, unspecified: Secondary | ICD-10-CM

## 2019-11-29 DIAGNOSIS — K219 Gastro-esophageal reflux disease without esophagitis: Secondary | ICD-10-CM | POA: Diagnosis not present

## 2019-11-29 DIAGNOSIS — E119 Type 2 diabetes mellitus without complications: Secondary | ICD-10-CM | POA: Diagnosis not present

## 2019-11-29 DIAGNOSIS — E785 Hyperlipidemia, unspecified: Secondary | ICD-10-CM

## 2019-11-29 DIAGNOSIS — Z5181 Encounter for therapeutic drug level monitoring: Secondary | ICD-10-CM

## 2019-11-29 DIAGNOSIS — F320 Major depressive disorder, single episode, mild: Secondary | ICD-10-CM

## 2019-11-29 LAB — TSH: TSH: 5.13 mIU/L — ABNORMAL HIGH (ref 0.40–4.50)

## 2019-11-29 MED ORDER — LOSARTAN POTASSIUM 50 MG PO TABS: 50.0000 mg | ORAL_TABLET | Freq: Every day | ORAL | 3 refills | Status: DC

## 2019-11-29 NOTE — Patient Instructions (Signed)
Decrease losartan to 50 mg once a day and monitor BP:   How to Take Your Blood Pressure You can take your blood pressure at home with a machine. You may need to check your blood pressure at home:  To check if you have high blood pressure (hypertension).  To check your blood pressure over time.  To make sure your blood pressure medicine is working. Supplies needed: You will need a blood pressure machine, or monitor. You can buy one at a drugstore or online. When choosing one:  Choose one with an arm cuff.  Choose one that wraps around your upper arm. Only one finger should fit between your arm and the cuff.  Do not choose one that measures your blood pressure from your wrist or finger. Your doctor can suggest a monitor. How to prepare Avoid these things for 30 minutes before checking your blood pressure:  Drinking caffeine.  Drinking alcohol.  Eating.  Smoking.  Exercising. Five minutes before checking your blood pressure:  Pee.  Sit in a dining chair. Avoid sitting in a soft couch or armchair.  Be quiet. Do not talk. How to take your blood pressure Follow the instructions that came with your machine. If you have a digital blood pressure monitor, these may be the instructions: 1. Sit up straight. 2. Place your feet on the floor. Do not cross your ankles or legs. 3. Rest your left arm at the level of your heart. You may rest it on a table, desk, or chair. 4. Pull up your shirt sleeve. 5. Wrap the blood pressure cuff around the upper part of your left arm. The cuff should be 1 inch (2.5 cm) above your elbow. It is best to wrap the cuff around bare skin. 6. Fit the cuff snugly around your arm. You should be able to place only one finger between the cuff and your arm. 7. Put the cord inside the groove of your elbow. 8. Press the power button. 9. Sit quietly while the cuff fills with air and loses air. 10. Write down the numbers on the screen. 11. Wait 2-3 minutes and  then repeat steps 1-10. What do the numbers mean? Two numbers make up your blood pressure. The first number is called systolic pressure. The second is called diastolic pressure. An example of a blood pressure reading is "120 over 80" (or 120/80). If you are an adult and do not have a medical condition, use this guide to find out if your blood pressure is normal: Normal  First number: below 120.  Second number: below 80. Elevated  First number: 120-129.  Second number: below 80. Hypertension stage 1  First number: 130-139.  Second number: 80-89. Hypertension stage 2  First number: 140 or above.  Second number: 90 or above. Your blood pressure is above normal even if only the top or bottom number is above normal. Follow these instructions at home:  Check your blood pressure as often as your doctor tells you to.  Take your monitor to your next doctor's appointment. Your doctor will: ? Make sure you are using it correctly. ? Make sure it is working right.  Make sure you understand what your blood pressure numbers should be.  Tell your doctor if your medicines are causing side effects. Contact a doctor if:  Your blood pressure keeps being high. Get help right away if:  Your first blood pressure number is higher than 180.  Your second blood pressure number is higher than 120. This  information is not intended to replace advice given to you by your health care provider. Make sure you discuss any questions you have with your health care provider. Document Revised: 03/04/2017 Document Reviewed: 08/29/2015 Elsevier Patient Education  2020 [employer]    Blood Pressure Record Sheet To take your blood pressure, you will need a blood pressure machine. You can buy a blood pressure machine (blood pressure monitor) at your clinic, drug store, or online. When choosing one, consider:  An automatic monitor that has an arm cuff.  A cuff that wraps snugly around your upper arm.  You should be able to fit only one finger between your arm and the cuff.  A device that stores blood pressure reading results.  Do not choose a monitor that measures your blood pressure from your wrist or finger. Follow your health care provider's instructions for how to take your blood pressure. To use this form:  Get one reading in the morning (a.m.) before you take any medicines.  Get one reading in the evening (p.m.) before supper.  Take at least 2 readings with each blood pressure check. This makes sure the results are correct. Wait 1-2 minutes between measurements.  Write down the results in the spaces on this form.  Repeat this once a week, or as told by your health care provider.  Make a follow-up appointment with your health care provider to discuss the results. Blood pressure log Date: _______________________  a.m. _____________________(1st reading) _____________________(2nd reading)  p.m. _____________________(1st reading) _____________________(2nd reading) Date: _______________________  a.m. _____________________(1st reading) _____________________(2nd reading)  p.m. _____________________(1st reading) _____________________(2nd reading) Date: _______________________  a.m. _____________________(1st reading) _____________________(2nd reading)  p.m. _____________________(1st reading) _____________________(2nd reading) Date: _______________________  a.m. _____________________(1st reading) _____________________(2nd reading)  p.m. _____________________(1st reading) _____________________(2nd reading) Date: _______________________  a.m. _____________________(1st reading) _____________________(2nd reading)  p.m. _____________________(1st reading) _____________________(2nd reading) This information is not intended to replace advice given to you by your health care provider. Make sure you discuss any questions you have with your health care provider. Document Revised:  05/20/2017 Document Reviewed: 03/22/2017 Elsevier Patient Education  2020 [employer].

## 2019-12-13 MED ORDER — LEVOTHYROXINE SODIUM 125 MCG PO TABS: 125.0000 ug | ORAL_TABLET | Freq: Every day | ORAL | 0 refills | Status: DC

## 2019-12-13 NOTE — Addendum Note (Signed)
Addended by: Danelle Berry on: 12/13/2019 01:01 PM   Modules accepted: Orders

## 2020-01-31 ENCOUNTER — Ambulatory Visit
Admission: RE | Admit: 2020-01-31 | Discharge: 2020-01-31 | Disposition: A | Discharge status: Home / Self Care | Payer: Medicare Other | Attending: Family Medicine | Admitting: Family Medicine

## 2020-01-31 ENCOUNTER — Encounter: Payer: Self-pay | Admitting: Family Medicine

## 2020-01-31 ENCOUNTER — Ambulatory Visit
Admission: RE | Admit: 2020-01-31 | Discharge: 2020-01-31 | Disposition: A | Discharge status: Home / Self Care | Payer: Medicare Other | Source: Ambulatory Visit | Attending: Family Medicine | Admitting: Family Medicine

## 2020-01-31 ENCOUNTER — Ambulatory Visit (INDEPENDENT_AMBULATORY_CARE_PROVIDER_SITE_OTHER): Payer: Medicare Other | Admitting: Family Medicine

## 2020-01-31 ENCOUNTER — Other Ambulatory Visit: Payer: Self-pay

## 2020-01-31 VITALS — BP 130/80 | HR 88 | Temp 98.6°F | Resp 16 | Ht 66.0 in | Wt 203.4 lb

## 2020-01-31 DIAGNOSIS — G309 Alzheimer's disease, unspecified: Secondary | ICD-10-CM

## 2020-01-31 DIAGNOSIS — J302 Other seasonal allergic rhinitis: Secondary | ICD-10-CM

## 2020-01-31 DIAGNOSIS — I10 Essential (primary) hypertension: Secondary | ICD-10-CM | POA: Diagnosis not present

## 2020-01-31 DIAGNOSIS — F0150 Vascular dementia without behavioral disturbance: Secondary | ICD-10-CM

## 2020-01-31 DIAGNOSIS — E785 Hyperlipidemia, unspecified: Secondary | ICD-10-CM | POA: Diagnosis not present

## 2020-01-31 DIAGNOSIS — F0280 Dementia in other diseases classified elsewhere without behavioral disturbance: Secondary | ICD-10-CM

## 2020-01-31 DIAGNOSIS — E119 Type 2 diabetes mellitus without complications: Secondary | ICD-10-CM | POA: Diagnosis not present

## 2020-01-31 DIAGNOSIS — N3281 Overactive bladder: Secondary | ICD-10-CM

## 2020-01-31 DIAGNOSIS — M89312 Hypertrophy of bone, left shoulder: Secondary | ICD-10-CM | POA: Diagnosis not present

## 2020-01-31 DIAGNOSIS — K219 Gastro-esophageal reflux disease without esophagitis: Secondary | ICD-10-CM

## 2020-01-31 DIAGNOSIS — Z5181 Encounter for therapeutic drug level monitoring: Secondary | ICD-10-CM

## 2020-01-31 DIAGNOSIS — F419 Anxiety disorder, unspecified: Secondary | ICD-10-CM

## 2020-01-31 DIAGNOSIS — E039 Hypothyroidism, unspecified: Secondary | ICD-10-CM | POA: Diagnosis not present

## 2020-01-31 DIAGNOSIS — F99 Mental disorder, not otherwise specified: Secondary | ICD-10-CM

## 2020-01-31 DIAGNOSIS — F5105 Insomnia due to other mental disorder: Secondary | ICD-10-CM

## 2020-01-31 DIAGNOSIS — F320 Major depressive disorder, single episode, mild: Secondary | ICD-10-CM

## 2020-01-31 MED ORDER — QUETIAPINE FUMARATE 25 MG PO TABS: 25.0000 mg | ORAL_TABLET | Freq: Every evening | ORAL | 1 refills | Status: DC | PRN

## 2020-01-31 MED ORDER — BUPROPION HCL ER (SR) 150 MG PO TB12: 150.0000 mg | ORAL_TABLET | Freq: Every morning | ORAL | 3 refills | Status: DC

## 2020-01-31 MED ORDER — OXYBUTYNIN CHLORIDE ER 5 MG PO TB24: 5.0000 mg | ORAL_TABLET | Freq: Every day | ORAL | 3 refills | Status: DC

## 2020-01-31 MED ORDER — LORATADINE 10 MG PO TABS: 10.0000 mg | ORAL_TABLET | Freq: Every day | ORAL | 3 refills | Status: DC

## 2020-01-31 NOTE — Progress Notes (Signed)
Name: Katherine Stevens   MRN: [id number]    DOB: [birth year]   Date:01/31/2020       Progress Note  Chief Complaint  Patient presents with  . Hypothyroidism     Subjective:   Katherine Stevens is a 74 y.o. female, presents to clinic for routine f/up  Hypothyroidism Difficulty with small dose adjustments getting pts TSH in normal range Currently on 125 mcg daily Was initially off meds when she established care here Labs improved with improved med compliance and dose was increased after still TSH was still not in normal range.  On 125 mcg Feb to April, TSH was too low.  We attempted doing a 1/2 pill one day a week, and then tried decreasing dose daily to 112 and then TSH was too high. Pt wished to increase back to 125 mcg daily again.  Here for recheck after dose adjustment back to 125 2 months ago Current Symptoms: denies fatigue, weight changes, heat/cold intolerance, bowel/skin changes or CVS symptoms Most recent results are below; we will be repeating labs today. Lab Results  Component Value Date   TSH 5.13 (H) 11/29/2019   TSH 0.01 (L) 10/18/2019   TSH 0.01 (L) 09/06/2019   TSH 0.02 (L) 07/24/2019   TSH 8.18 (H) 05/18/2019   TSH 41.76 (H) 04/16/2019    DM - has been stable and well controlled, been able to decrease dose over the past year On metformin 500 mg once daily Lab Results  Component Value Date   HGBA1C 5.6 07/24/2019   Hyperlipidemia: Currently treated with lipitor,  pt reports good med compliance Last Lipids: Lab Results  Component Value Date   CHOL 108 07/24/2019   HDL 54 07/24/2019   LDLCALC 39 07/24/2019   TRIG 71 07/24/2019   CHOLHDL 2.0 07/24/2019   - Denies: Chest pain, shortness of breath, myalgias, claudication  Hypertension:  Currently managed on losartan 50 mg onc Pt reports good med compliance and denies any SE.   Blood pressure today is well controlled. BP Readings from Last 3 Encounters:  01/31/20 130/80  11/29/19 124/76  09/06/19  122/62   Pt denies CP, SOB, exertional sx, LE edema, palpitation, Ha's, visual disturbances, lightheadedness, hypotension, syncope.  Left upper chest/clavical abnormal - looks enlarged, pt noticed 2 weeks ago, no injury, not painful, no pleuritic CP  She asks for oxybutinin Rx for OAB - ran out of med from last PCP in [place] - not on list  Mood/anxiety/depression:  She has always stated she feels fine On wellbutrin 150 mg BID and celexa 10 mg daily She complains of racing thoughts at bedtime and trouble sleeping, "thinking stupid thoughts" Wellbutrin dosing came from past PCP Depression screen [facility] 2/9 01/31/2020 11/29/2019 09/06/2019  Decreased Interest 2 1 0  Down, Depressed, Hopeless 0 1 0  PHQ - 2 Score 2 2 0  Altered sleeping - 1 0  Tired, decreased energy - 1 0  Change in appetite - 0 0  Feeling bad or failure about yourself  - 0 0  Trouble concentrating - 2 0  Moving slowly or fidgety/restless - 0 0  Suicidal thoughts - 0 0  PHQ-9 Score - 6 0  Difficult doing work/chores - Somewhat difficult Not difficult at all   Chronic allergies - pt also asked for loratadine refill   GERD:  On protonix daily at bedtime, she continues to have well controlled sx, denies N/V, indigestion, reflux, abd pain  Dementia - managed by [facility] neurology - on aricept  10 mg  She did HH and she asks why she needed SLP, states she speaks well/no problems.  Sleep study was previously recommended and ordered from specialists - do not believe she has ever completed this.    Current Outpatient Medications:  .  atorvastatin (LIPITOR) 40 MG tablet, Take 1 tablet (40 mg total) by mouth at bedtime., Disp: 90 tablet, Rfl: 3 .  buPROPion (WELLBUTRIN) 75 MG tablet, Take 2 tablets (150 mg total) by mouth 2 (two) times daily., Disp: 360 tablet, Rfl: 3 .  citalopram (CELEXA) 10 MG tablet, Take 1 tablet (10 mg total) by mouth daily., Disp: 90 tablet, Rfl: 3 .  donepezil (ARICEPT) 5 MG tablet, Take 10 mg by  mouth. , Disp: , Rfl:  .  levothyroxine (SYNTHROID) 125 MCG tablet, Take 1 tablet (125 mcg total) by mouth daily before breakfast., Disp: 60 tablet, Rfl: 0 .  loratadine (CLARITIN) 10 MG tablet, Take 10 mg by mouth daily. , Disp: , Rfl:  .  losartan (COZAAR) 50 MG tablet, Take 1 tablet (50 mg total) by mouth daily., Disp: 90 tablet, Rfl: 3 .  metFORMIN (GLUCOPHAGE) 500 MG tablet, Take 1 tablet (500 mg total) by mouth daily., Disp: 90 tablet, Rfl: 3 .  pantoprazole (PROTONIX) 20 MG tablet, Take 1 tablet (20 mg total) by mouth at bedtime., Disp: 90 tablet, Rfl: 3 .  Travoprost, BAK Free, (TRAVATAN) 0.004 % SOLN ophthalmic solution, 1 drop at bedtime., Disp: , Rfl:  .  vitamin B-12 (CYANOCOBALAMIN) 500 MCG tablet, Take 500 mcg by mouth 2 (two) times daily., Disp: , Rfl:   Patient Active Problem List   Diagnosis Date Noted  . Hypertension 09/06/2019  . Gastroesophageal reflux disease without esophagitis 05/18/2019  . Current mild episode of major depressive disorder (HCC) 04/16/2019  . Type 2 diabetes mellitus without complication, without long-term current use of insulin (HCC) 04/16/2019  . Hypothyroidism 04/16/2019  . Hyperlipidemia 04/16/2019  . Cirrhosis of liver without ascites (HCC) 04/16/2019  . Vertigo 04/16/2019  . 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 Folate deficiency 04/16/2019  . Vitamin B1 deficiency 04/16/2019  . Tobacco dependence 12/12/2015  . Osteoarthritis of both knees 08/01/2012  . Bladder spasm 07/28/2012  . Seasonal allergies 07/28/2012  . Chronic hepatitis C without hepatic coma (HCC) 02/07/2012  . Obesity 07/29/2011    Past Surgical History:  Procedure Laterality Date  . ABDOMINAL HYSTERECTOMY      Family History  Problem Relation Age of Onset  . Heart failure Mother   . Diabetes Mother   . Diabetes Sister   . Hyperlipidemia Sister   . Thyroid disease Sister   . Stroke Brother   . Heart attack Brother   . Cancer Maternal  Grandmother   . Heart attack Brother     Social History   Tobacco Use  . Smoking status: Current Every Day Smoker    Packs/day: 0.25    Types: Cigarettes  . Smokeless tobacco: Never Used  . Tobacco comment: 6-7 cigarettes a day  Vaping Use  . Vaping Use: Never used  Substance Use Topics  . Alcohol use: Not Currently  . Drug use: Not Currently     Allergies  Allergen Reactions  . Lisinopril Anaphylaxis    MOUTH & JAW SWELLING    Health Maintenance  Topic Date Due  . COVID-19 Vaccine (1) Never done  . INFLUENZA VACCINE  11/04/2019  . HEMOGLOBIN A1C  01/23/2020  . MAMMOGRAM  04/15/2020 (Originally 04/05/1996)  .  DEXA SCAN  04/15/2020 (Originally 04/06/2011)  . COLONOSCOPY  04/15/2020 (Originally 04/05/1996)  . TETANUS/TDAP  04/15/2020 (Originally 04/05/1965)  . OPHTHALMOLOGY EXAM  07/04/2020  . FOOT EXAM  07/23/2020  . Hepatitis C Screening  Completed  . PNA vac Low Risk Adult  Completed    Chart Review Today: I personally reviewed active problem list, medication list, allergies, family history, social history, health maintenance, notes from last encounter, lab results, imaging with the patient/caregiver today.   Review of Systems  10 Systems reviewed and are negative for acute change except as noted in the HPI.  Objective:   Vitals:   01/31/20 1115  BP: 130/80  Pulse: 88  Resp: 16  Temp: 98.6 F (37 C)  TempSrc: Oral  SpO2: 98%  Weight: 203 lb 6.4 oz (92.3 kg)  Height:  (1.676 m)    Body mass index is 32.83 kg/m.  Physical Exam Vitals and nursing note reviewed.  Constitutional:      General: She is not in acute distress.    Appearance: Normal appearance. She is well-developed. She is obese. She is not ill-appearing, toxic-appearing or diaphoretic.     Interventions: Face mask in place.  HENT:     Head: Normocephalic and atraumatic.     Right Ear: External ear normal.     Left Ear: External ear normal.  Eyes:     General: Lids are normal. No  scleral icterus.       Right eye: No discharge.        Left eye: No discharge.     Conjunctiva/sclera: Conjunctivae normal.  Neck:     Trachea: Phonation normal. No tracheal deviation.  Cardiovascular:     Rate and Rhythm: Normal rate and regular rhythm.     Pulses: Normal pulses.          Radial pulses are 2+ on the right side and 2+ on the left side.       Posterior tibial pulses are 2+ on the right side and 2+ on the left side.     Heart sounds: Normal heart sounds. No murmur heard.  No friction rub. No gallop.   Pulmonary:     Effort: Pulmonary effort is normal. No respiratory distress.     Breath sounds: Normal breath sounds. No stridor. No wheezing, rhonchi or rales.  Chest:     Chest wall: Mass and swelling present. No tenderness, crepitus or edema.     Comments: Left medial clavical and Geiger joint enlarged raised and asymmetrical when compared to right, surrounding soft tissue also prominent, all non-tender, no erythema, no crepitus Abdominal:     General: Bowel sounds are normal. There is no distension.     Palpations: Abdomen is soft.  Musculoskeletal:     Right lower leg: No edema.     Left lower leg: No edema.  Lymphadenopathy:     Upper Body:     Left upper body: No supraclavicular adenopathy.  Skin:    General: Skin is warm and dry.     Coloration: Skin is not jaundiced or pale.     Findings: No rash.  Neurological:     Mental Status: She is alert.     Motor: No abnormal muscle tone.     Gait: Gait abnormal.     Comments: Walks slowly with cane  Psychiatric:        Mood and Affect: Mood and affect normal.        Speech: Speech normal.  Behavior: Behavior normal. Behavior is cooperative.        Cognition and Memory: She exhibits impaired recent memory.         Assessment & Plan:   1. Hypothyroidism, unspecified type Last dose adjustment increased to 125 mcg again Pt is feeling good, no concerning sx, recheck labs - TSH  2. Primary  hypertension Stable, well controlled Reviewed med changes Losartan once a day, no longer BID like she was previously doing with last PCP - COMPLETE METABOLIC PANEL WITH GFR  3. Hyperlipidemia, unspecified hyperlipidemia type Last lipids at goal, pt compliant, no SE or concerns - COMPLETE METABOLIC PANEL WITH GFR  4. Type 2 diabetes mellitus without complication, without long-term current use of insulin (HCC) Has been well controlled and A1C at goal with metformin 500 mg once daily, recheck A1C today, pt not checking sugars - COMPLETE METABOLIC PANEL WITH GFR - Hemoglobin A1c  5. Encounter for medication monitoring - CBC with Differential/Platelet - COMPLETE METABOLIC PANEL WITH GFR - Hemoglobin A1c - TSH  6. Hypertrophy of left clavicle Enlarged medial aspect of left clavicle and surrounding tissue and [place] joint - no pain, erythema, no injury and no other associated chest wall or pulmonary sx -  Pt wants xray to eval - DG Clavicle Left; Future  7. OAB (overactive bladder) Pt asks for med refill - was not on chart - from [place] pcp - with dementia may need to get pt approved for myrbetriq - oxybutynin (DITROPAN-XL) 5 MG 24 hr tablet; Take 1 tablet (5 mg total) by mouth daily.  Dispense: 90 tablet; Refill: 3  8. Insomnia due to other mental disorder Change wellbutrin meds - d/c immediate release 75 tabs, taking 150 mg BID -  Change to once daily 150 mg 12 hour wellbutrin in the morning Trial of seroquel at bedtime if still having racing thoughts and insomnia   9. Anxiety Change in meds as noted above, hope eliminating nightime dose of wellbutrin will help Continue wellbutrin, celexa - buPROPion (WELLBUTRIN SR) 150 MG 12 hr tablet; Take 1 tablet (150 mg total) by mouth in the morning.  Dispense: 90 tablet; Refill: 3 - QUEtiapine (SEROQUEL) 25 MG tablet; Take 1 tablet (25 mg total) by mouth at bedtime as needed (for insomnia).  Dispense: 60 tablet; Refill: 1  10. Mixed  Alzheimer's and vascular dementia [facility](HCC) Per [place] neurology -  Pt did HH, last OV reviewed through care everywhere April 2021, on aricept 10 mg?  Unclear if she is taking and following up with them  - donepezil (ARICEPT) 10 MG tablet; Take 10 mg by mouth daily.  11. Seasonal allergies Refills on generic claritin - loratadine (CLARITIN) 10 MG tablet; Take 1 tablet (10 mg total) by mouth at bedtime.  Dispense: 90 tablet; Refill: 3  12. Gastroesophageal reflux disease without esophagitis Stable, sx well controlled on protonix, continue to take at bedtime  13. Current mild episode of major depressive disorder, unspecified whether recurrent (HCC) See #8 and #9 - mood good, no concerning depressive sx  - COMPLETE METABOLIC PANEL WITH GFR  - buPROPion (WELLBUTRIN SR) 150 MG 12 hr tablet; Take 1 tablet (150 mg total) by mouth in the morning.  Dispense: 90 tablet; Refill: 3   Instructions typed out clearly for patient today and given on AVS as follows for med changes:  Ask your pharmacy to fill celexa/citalopram that I have prescribed you in the past (march)  Stop your wellbutrin you have at home, and I am  changing it to once a day in the morning.  This will hopefully help with your trouble sleeping  If you still have trouble sleeping you can try a dose of seroquel before bedtime.  Your blood pressure medicine is once a day - losartan 50 mg once daily  Your diabetes medication is once a day - metformin 500 mg once daily with food.  Thyroid medicines in the morning on an empty stomach  Take your pantoprazole, atorvastatin and loratidine at bedtime  I sent in your bladder medicine   Return in about 3 months (around 05/02/2020) for f/up anxiety/mdd/insomnia and thyroid and HTN med changes.   Danelle Berry, PA-C 01/31/20 11:19 AM

## 2020-01-31 NOTE — Patient Instructions (Addendum)
Ask your pharmacy to fill celexa/citalopram that I have prescribed you in the past (march)  Stop your wellbutrin you have at home, and I am changing it to once a day in the morning.  This will hopefully help with your trouble sleeping  If you still have trouble sleeping you can try a dose of seroquel before bedtime.  Your blood pressure medicine is once a day - losartan 50 mg once daily  Your diabetes medication is once a day - metformin 500 mg once daily with food.  Thyroid medicines in the morning on an empty stomach  Take your pantoprazole, atorvastatin and loratidine at bedtime  I sent in your bladder medicine

## 2020-02-01 LAB — HEMOGLOBIN A1C
Hgb A1c MFr Bld: 5.9 % of total Hgb — ABNORMAL HIGH (ref <5.7)
Mean Plasma Glucose: 123 (calc)
eAG (mmol/L): 6.8 (calc)

## 2020-02-01 LAB — CBC WITH DIFFERENTIAL/PLATELET
Absolute Monocytes: 733 cells/uL (ref 200–950)
Basophils Absolute: 19 cells/uL (ref 0–200)
Basophils Relative: 0.2 %
Eosinophils Absolute: 132 cells/uL (ref 15–500)
Eosinophils Relative: 1.4 %
HCT: 38.4 % (ref 35.0–45.0)
Hemoglobin: 12.8 g/dL (ref 11.7–15.5)
Lymphs Abs: 3149 cells/uL (ref 850–3900)
MCH: 29.5 pg (ref 27.0–33.0)
MCHC: 33.3 g/dL (ref 32.0–36.0)
MCV: 88.5 fL (ref 80.0–100.0)
MPV: 9.8 fL (ref 7.5–12.5)
Monocytes Relative: 7.8 %
Neutro Abs: 5367 cells/uL (ref 1500–7800)
Neutrophils Relative %: 57.1 %
Platelets: 277 10*3/uL (ref 140–400)
RBC: 4.34 10*6/uL (ref 3.80–5.10)
RDW: 13.3 % (ref 11.0–15.0)
Total Lymphocyte: 33.5 %
WBC: 9.4 10*3/uL (ref 3.8–10.8)

## 2020-02-01 LAB — COMPLETE METABOLIC PANEL WITH GFR
AG Ratio: 1.1 (calc) (ref 1.0–2.5)
ALT: 14 U/L (ref 6–29)
AST: 18 U/L (ref 10–35)
Albumin: 4 g/dL (ref 3.6–5.1)
Alkaline phosphatase (APISO): 131 U/L (ref 37–153)
BUN/Creatinine Ratio: 14 (calc) (ref 6–22)
BUN: 14 mg/dL (ref 7–25)
CO2: 27 mmol/L (ref 20–32)
Calcium: 9.6 mg/dL (ref 8.6–10.4)
Chloride: 106 mmol/L (ref 98–110)
Creat: 1.03 mg/dL — ABNORMAL HIGH (ref 0.60–0.93)
GFR, Est African American: 62 mL/min/{1.73_m2} (ref >=60)
GFR, Est Non African American: 54 mL/min/{1.73_m2} — ABNORMAL LOW (ref >=60)
Globulin: 3.5 g/dL (calc) (ref 1.9–3.7)
Glucose, Bld: 115 mg/dL — ABNORMAL HIGH (ref 65–99)
Potassium: 4.6 mmol/L (ref 3.5–5.3)
Sodium: 140 mmol/L (ref 135–146)
Total Bilirubin: 0.5 mg/dL (ref 0.2–1.2)
Total Protein: 7.5 g/dL (ref 6.1–8.1)

## 2020-02-01 LAB — TSH: TSH: 0.01 mIU/L — ABNORMAL LOW (ref 0.40–4.50)

## 2020-02-04 ENCOUNTER — Other Ambulatory Visit: Payer: Self-pay | Admitting: Family Medicine

## 2020-02-04 DIAGNOSIS — E039 Hypothyroidism, unspecified: Secondary | ICD-10-CM

## 2020-02-04 MED ORDER — LEVOTHYROXINE SODIUM 13 MCG PO CAPS: 1.0000 | ORAL_CAPSULE | ORAL | 3 refills | Status: DC

## 2020-02-04 MED ORDER — LEVOTHYROXINE SODIUM 112 MCG PO TABS: 112.0000 ug | ORAL_TABLET | Freq: Every day | ORAL | 3 refills | Status: DC

## 2020-05-01 ENCOUNTER — Other Ambulatory Visit: Payer: Self-pay

## 2020-05-01 ENCOUNTER — Ambulatory Visit (INDEPENDENT_AMBULATORY_CARE_PROVIDER_SITE_OTHER): Payer: Medicare Other | Admitting: Family Medicine

## 2020-05-01 ENCOUNTER — Encounter: Payer: Self-pay | Admitting: Family Medicine

## 2020-05-01 VITALS — BP 138/82 | HR 84 | Temp 98.5°F | Resp 18 | Ht 66.0 in | Wt 214.2 lb

## 2020-05-01 DIAGNOSIS — F17200 Nicotine dependence, unspecified, uncomplicated: Secondary | ICD-10-CM

## 2020-05-01 DIAGNOSIS — F320 Major depressive disorder, single episode, mild: Secondary | ICD-10-CM

## 2020-05-01 DIAGNOSIS — R269 Unspecified abnormalities of gait and mobility: Secondary | ICD-10-CM

## 2020-05-01 DIAGNOSIS — F0280 Dementia in other diseases classified elsewhere without behavioral disturbance: Secondary | ICD-10-CM

## 2020-05-01 DIAGNOSIS — I10 Essential (primary) hypertension: Secondary | ICD-10-CM

## 2020-05-01 DIAGNOSIS — J302 Other seasonal allergic rhinitis: Secondary | ICD-10-CM

## 2020-05-01 DIAGNOSIS — E669 Obesity, unspecified: Secondary | ICD-10-CM

## 2020-05-01 DIAGNOSIS — Z1231 Encounter for screening mammogram for malignant neoplasm of breast: Secondary | ICD-10-CM

## 2020-05-01 DIAGNOSIS — E119 Type 2 diabetes mellitus without complications: Secondary | ICD-10-CM | POA: Diagnosis not present

## 2020-05-01 DIAGNOSIS — F99 Mental disorder, not otherwise specified: Secondary | ICD-10-CM

## 2020-05-01 DIAGNOSIS — F5105 Insomnia due to other mental disorder: Secondary | ICD-10-CM

## 2020-05-01 DIAGNOSIS — E039 Hypothyroidism, unspecified: Secondary | ICD-10-CM | POA: Diagnosis not present

## 2020-05-01 DIAGNOSIS — Z6834 Body mass index (BMI) 34.0-34.9, adult: Secondary | ICD-10-CM

## 2020-05-01 DIAGNOSIS — G309 Alzheimer's disease, unspecified: Secondary | ICD-10-CM

## 2020-05-01 DIAGNOSIS — F0150 Vascular dementia without behavioral disturbance: Secondary | ICD-10-CM

## 2020-05-01 DIAGNOSIS — M8580 Other specified disorders of bone density and structure, unspecified site: Secondary | ICD-10-CM

## 2020-05-01 DIAGNOSIS — Z1211 Encounter for screening for malignant neoplasm of colon: Secondary | ICD-10-CM

## 2020-05-01 DIAGNOSIS — E785 Hyperlipidemia, unspecified: Secondary | ICD-10-CM

## 2020-05-01 DIAGNOSIS — K219 Gastro-esophageal reflux disease without esophagitis: Secondary | ICD-10-CM

## 2020-05-01 DIAGNOSIS — N3281 Overactive bladder: Secondary | ICD-10-CM | POA: Insufficient documentation

## 2020-05-01 DIAGNOSIS — Z8619 Personal history of other infectious and parasitic diseases: Secondary | ICD-10-CM

## 2020-05-01 DIAGNOSIS — K7460 Unspecified cirrhosis of liver: Secondary | ICD-10-CM

## 2020-05-01 DIAGNOSIS — Z780 Asymptomatic menopausal state: Secondary | ICD-10-CM

## 2020-05-01 DIAGNOSIS — Z5181 Encounter for therapeutic drug level monitoring: Secondary | ICD-10-CM

## 2020-05-01 MED ORDER — METFORMIN HCL 500 MG PO TABS: 500.0000 mg | ORAL_TABLET | Freq: Every day | ORAL | 3 refills | Status: DC

## 2020-05-01 MED ORDER — CITALOPRAM HYDROBROMIDE 10 MG PO TABS: 10.0000 mg | ORAL_TABLET | Freq: Every morning | ORAL | 3 refills | Status: DC

## 2020-05-01 MED ORDER — OXYBUTYNIN CHLORIDE ER 5 MG PO TB24: 5.0000 mg | ORAL_TABLET | Freq: Every day | ORAL | 3 refills | Status: DC

## 2020-05-01 MED ORDER — BUPROPION HCL 75 MG PO TABS
ORAL_TABLET | ORAL | 1 refills | Status: DC
Start: 2020-05-01 — End: 2020-10-10

## 2020-05-01 MED ORDER — LOSARTAN POTASSIUM 50 MG PO TABS
50.0000 mg | ORAL_TABLET | ORAL | 3 refills | Status: DC
Start: 2020-05-01 — End: 2021-04-20

## 2020-05-01 MED ORDER — CITALOPRAM HYDROBROMIDE 10 MG PO TABS: 10.0000 mg | ORAL_TABLET | Freq: Every day | ORAL | 3 refills | Status: DC

## 2020-05-01 MED ORDER — ATORVASTATIN CALCIUM 40 MG PO TABS
40.0000 mg | ORAL_TABLET | Freq: Every day | ORAL | 3 refills | Status: DC
Start: 2020-05-01 — End: 2021-05-01

## 2020-05-01 MED ORDER — PANTOPRAZOLE SODIUM 20 MG PO TBEC
20.0000 mg | DELAYED_RELEASE_TABLET | Freq: Every day | ORAL | 3 refills | Status: DC
Start: 2020-05-01 — End: 2021-04-30

## 2020-05-01 MED ORDER — ATORVASTATIN CALCIUM 40 MG PO TABS: 40.0000 mg | ORAL_TABLET | Freq: Every day | ORAL | 3 refills | Status: DC

## 2020-05-01 MED ORDER — QUETIAPINE FUMARATE 25 MG PO TABS: 12.5000 mg | ORAL_TABLET | Freq: Every evening | ORAL | 1 refills | Status: DC | PRN

## 2020-05-01 MED ORDER — LORATADINE 10 MG PO TABS: 10.0000 mg | ORAL_TABLET | Freq: Every day | ORAL | 3 refills | Status: DC

## 2020-05-01 NOTE — Progress Notes (Signed)
Name: Katherine Stevens   MRN: [id number]    DOB: 1945/12/03   Date:05/01/2020       Progress Note  Chief Complaint  Patient presents with  . Depression  . Anxiety     Subjective:   Katherine Stevens is a 75 y.o. female, presents to clinic for routine f/up  Hypertension:  Currently managed on losartan 50 mg daily Pt reports good med compliance and denies any SE.   Blood pressure today is well controlled. BP Readings from Last 3 Encounters:  05/01/20 138/82  01/31/20 130/80  11/29/19 124/76   Pt denies CP, SOB, exertional sx, LE edema, palpitation, Ha's, visual disturbances, lightheadedness, hypotension, syncope.   Hypothyroidism:  Difficult to get TSH in normal range History: hypothyroidism Current Medication Regimen: 112 mcg synthroid 4d a week and 112 + 13 3 d a week Takes medicine correctly Current Symptoms: denies fatigue, weight changes, heat/cold intolerance, bowel/skin changes or CVS symptoms Most recent results are below; we will be repeating labs today. Lab Results  Component Value Date   TSH 0.01 (L) 01/31/2020    Hyperlipidemia: Currently treated with lipitor 40, pt reports good med compliance Last Lipids: Lab Results  Component Value Date   CHOL 108 07/24/2019   HDL 54 07/24/2019   LDLCALC 39 07/24/2019   TRIG 71 07/24/2019   CHOLHDL 2.0 07/24/2019   - Denies: Chest pain, shortness of breath, myalgias, claudication  DM:  Well controlled - reducing med dose over the past year Pt managing DM with metformin 500 mg once daily Reports good med compliance Pt has no SE from meds. Blood sugars not checking Denies: Polyuria, polydipsia, vision changes, neuropathy, hypoglycemia Recent pertinent labs: Lab Results  Component Value Date   HGBA1C 5.9 (H) 01/31/2020   HGBA1C 5.6 07/24/2019   HGBA1C 6.1 (H) 04/16/2019   Insomnia/MDD - seroquel started- works well- taking half dose and wellbutrin medicine adjusted- stopped BID dosing, and increasing morning  dose - which she doesn't like, but insomnia is better.  She is also still taking celexa 10 mg, feels her moods are good. Still some trouble with sleep and fatigue, she feels moods are good, denies anxiety Reviewed old [facility] PCP visits for how they previously prescribed wellbutrin - they had previously titrated dose to her mood, for anhedonia and for smoking cessation  Depression screen [facility] 2/9 05/01/2020 01/31/2020 11/29/2019  Decreased Interest 0 2 1  Down, Depressed, Hopeless 0 0 1  PHQ - 2 Score 0 2 2  Altered sleeping 3 - 1  Tired, decreased energy 2 - 1  Change in appetite 0 - 0  Feeling bad or failure about yourself  0 - 0  Trouble concentrating 1 - 2  Moving slowly or fidgety/restless 0 - 0  Suicidal thoughts 0 - 0  PHQ-9 Score 6 - 6  Difficult doing work/chores Somewhat difficult - Somewhat difficult   GAD 7 : Generalized Anxiety Score 05/01/2020  Nervous, Anxious, on Edge 0  Control/stop worrying 0  Worry too much - different things 0  Trouble relaxing 0  Restless 0  Easily annoyed or irritable 0  Afraid - awful might happen 0  Total GAD 7 Score 0  Anxiety Difficulty Not difficult at all          Current Outpatient Medications:  .  atorvastatin (LIPITOR) 40 MG tablet, Take 1 tablet (40 mg total) by mouth at bedtime., Disp: 90 tablet, Rfl: 3 .  buPROPion (WELLBUTRIN SR) 150 MG 12 hr tablet, Take 1  tablet (150 mg total) by mouth in the morning., Disp: 90 tablet, Rfl: 3 .  citalopram (CELEXA) 10 MG tablet, Take 1 tablet (10 mg total) by mouth daily., Disp: 90 tablet, Rfl: 3 .  donepezil (ARICEPT) 10 MG tablet, Take 10 mg by mouth daily., Disp: , Rfl:  .  levothyroxine (SYNTHROID) 112 MCG tablet, Take 1 tablet (112 mcg total) by mouth daily before breakfast. Three days a week take with 13 mcg capsule, Disp: 90 tablet, Rfl: 3 .  Levothyroxine Sodium 13 MCG CAPS, Take 1 capsule (13 mcg total) by mouth 3 (three) times a week. Add to 112 dose three times a week for total of 125  mcg, Disp: 36 capsule, Rfl: 3 .  loratadine (CLARITIN) 10 MG tablet, Take 1 tablet (10 mg total) by mouth at bedtime., Disp: 90 tablet, Rfl: 3 .  losartan (COZAAR) 50 MG tablet, Take 1 tablet (50 mg total) by mouth daily., Disp: 90 tablet, Rfl: 3 .  metFORMIN (GLUCOPHAGE) 500 MG tablet, Take 1 tablet (500 mg total) by mouth daily., Disp: 90 tablet, Rfl: 3 .  oxybutynin (DITROPAN-XL) 5 MG 24 hr tablet, Take 1 tablet (5 mg total) by mouth daily., Disp: 90 tablet, Rfl: 3 .  pantoprazole (PROTONIX) 20 MG tablet, Take 1 tablet (20 mg total) by mouth at bedtime., Disp: 90 tablet, Rfl: 3 .  QUEtiapine (SEROQUEL) 25 MG tablet, Take 1 tablet (25 mg total) by mouth at bedtime as needed (for insomnia)., Disp: 60 tablet, Rfl: 1 .  Travoprost, BAK Free, (TRAVATAN) 0.004 % SOLN ophthalmic solution, 1 drop at bedtime., Disp: , Rfl:   Patient Active Problem List   Diagnosis Date Noted  . Hypertension 09/06/2019  . Gastroesophageal reflux disease without esophagitis 05/18/2019  . Current mild episode of major depressive disorder (HCC) 04/16/2019  . Type 2 diabetes mellitus without complication, without long-term current use of insulin (HCC) 04/16/2019  . Hypothyroidism 04/16/2019  . Hyperlipidemia 04/16/2019  . Cirrhosis of liver without ascites (HCC) 04/16/2019  . Vertigo 04/16/2019  . 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 Folate deficiency 04/16/2019  . Vitamin B1 deficiency 04/16/2019  . Tobacco dependence 12/12/2015  . Osteoarthritis of both knees 08/01/2012  . Bladder spasm 07/28/2012  . Seasonal allergies 07/28/2012  . Chronic hepatitis C without hepatic coma (HCC) 02/07/2012  . Obesity 07/29/2011    Past Surgical History:  Procedure Laterality Date  . ABDOMINAL HYSTERECTOMY      Family History  Problem Relation Age of Onset  . Heart failure Mother   . Diabetes Mother   . Diabetes Sister   . Hyperlipidemia Sister   . Thyroid disease Sister   . Stroke Brother   .  Heart attack Brother   . Cancer Maternal Grandmother   . Heart attack Brother     Social History   Tobacco Use  . Smoking status: Current Every Day Smoker    Packs/day: 0.25    Types: Cigarettes  . Smokeless tobacco: Never Used  . Tobacco comment: 6-7 cigarettes a day  Vaping Use  . Vaping Use: Never used  Substance Use Topics  . Alcohol use: Not Currently  . Drug use: Not Currently     Allergies  Allergen Reactions  . Lisinopril Anaphylaxis    MOUTH & JAW SWELLING    Health Maintenance  Topic Date Due  . MAMMOGRAM  Never done  . TETANUS/TDAP  06/02/2020 (Originally 04/05/1965)  . INFLUENZA VACCINE  07/03/2020 (Originally 11/04/2019)  . DEXA SCAN  05/01/2021 (Originally 04/06/2011)  . COLONOSCOPY (Pts 45-40yrs Insurance coverage will need to be confirmed)  05/01/2021 (Originally 04/06/1991)  . OPHTHALMOLOGY EXAM  07/04/2020  . FOOT EXAM  07/23/2020  . HEMOGLOBIN A1C  07/31/2020  . COVID-19 Vaccine (3 - Booster for Moderna series) 10/28/2020  . Hepatitis C Screening  Completed  . PNA vac Low Risk Adult  Completed    Chart Review Today: I personally reviewed active problem list, medication list, allergies, family history, social history, health maintenance, notes from last encounter, lab results, imaging with the patient/caregiver today.   Review of Systems  Constitutional: Negative.   HENT: Negative.   Eyes: Negative.   Respiratory: Negative.   Cardiovascular: Negative.   Gastrointestinal: Negative.   Endocrine: Negative.   Genitourinary: Negative.   Musculoskeletal: Negative.   Skin: Negative.   Allergic/Immunologic: Negative.   Neurological: Negative.   Hematological: Negative.   Psychiatric/Behavioral: Negative.   All other systems reviewed and are negative.    Objective:   Vitals:   05/01/20 1021  BP: 138/82  Pulse: 84  Resp: 18  Temp: 98.5 F (36.9 C)  TempSrc: Oral  SpO2: 99%  Weight: 214 lb 3.2 oz (97.2 kg)  Height: 5\' 6"  (1.676 m)    Body  mass index is 34.57 kg/m.  Physical Exam Vitals and nursing note reviewed.  Constitutional:      General: She is not in acute distress.    Appearance: Normal appearance. She is well-developed. She is obese. She is not ill-appearing, toxic-appearing or diaphoretic.     Interventions: Face mask in place.  HENT:     Head: Normocephalic and atraumatic.     Right Ear: External ear normal.     Left Ear: External ear normal.  Eyes:     General: Lids are normal. No scleral icterus.       Right eye: No discharge.        Left eye: No discharge.     Conjunctiva/sclera: Conjunctivae normal.  Neck:     Trachea: Phonation normal. No tracheal deviation.  Cardiovascular:     Rate and Rhythm: Normal rate and regular rhythm.     Pulses: Normal pulses.          Radial pulses are 2+ on the right side and 2+ on the left side.       Posterior tibial pulses are 2+ on the right side and 2+ on the left side.     Heart sounds: Normal heart sounds. No murmur heard. No friction rub. No gallop.   Pulmonary:     Effort: Pulmonary effort is normal. No respiratory distress.     Breath sounds: Normal breath sounds. No stridor. No wheezing, rhonchi or rales.  Chest:     Chest wall: No tenderness.  Abdominal:     General: Bowel sounds are normal. There is no distension.     Palpations: Abdomen is soft.  Musculoskeletal:     Right lower leg: No edema.     Left lower leg: No edema.  Skin:    General: Skin is warm and dry.     Coloration: Skin is not jaundiced or pale.     Findings: No rash.  Neurological:     Mental Status: She is alert.     Motor: No abnormal muscle tone.     Gait: Gait abnormal (walks with rolling walker).  Psychiatric:        Attention and Perception: Attention normal.        Mood  and Affect: Mood normal.        Speech: Speech normal.        Behavior: Behavior normal. Behavior is cooperative.        Thought Content: Thought content does not include homicidal or suicidal ideation.  Thought content does not include homicidal or suicidal plan.         Assessment & Plan:   Pt is 75 y/o AAF here for her routine f/up and labs Presents alone, brings most of her meds - wants to change pharmacies  1. Hypothyroidism, unspecified type Comments: has been difficult to manage, recently tried dose between 112 mcg and 125 mcg, due for f/up labs, some decreased energy - TSH - AMB Referral to [facility] Coordinaton  2. Primary hypertension Comments: stable, well controlled, BP at goal today, continue to monitor, losartan 50 mg daily and continue to work on healthy diet/lifestyle - COMPLETE METABOLIC PANEL WITH GFR - losartan (COZAAR) 50 MG tablet; Take 1 tablet (50 mg total) by mouth every morning. For hypertension  Dispense: 90 tablet; Refill: 3 - AMB Referral to [facility] Coordinaton  3. Hyperlipidemia, unspecified hyperlipidemia type Comments: good statin compliance, no SE or concerns, cont at bedtime, due for lipid panel with next OV  - COMPLETE METABOLIC PANEL WITH GFR - AMB Referral to [facility] - atorvastatin (LIPITOR) 40 MG tablet; Take 1 tablet (40 mg total) by mouth at bedtime. For cholesterol  Dispense: 90 tablet; Refill: 3  4. Type 2 diabetes mellitus without complication, without long-term current use of insulin (HCC) Comments: stable, well controlled, due for labs - COMPLETE METABOLIC PANEL WITH GFR - Hemoglobin A1c - metFORMIN (GLUCOPHAGE) 500 MG tablet; Take 1 tablet (500 mg total) by mouth daily with breakfast. For well controlled diabetes  Dispense: 90 tablet; Refill: 3 - AMB Referral to [facility] - atorvastatin (LIPITOR) 40 MG tablet; Take 1 tablet (40 mg total) by mouth at bedtime. For cholesterol  Dispense: 90 tablet; Refill: 3  5. OAB (overactive bladder) Comments: con't oxybutynin - monitoring for anticholinergic SE - oxybutynin (DITROPAN-XL) 5 MG 24 hr tablet; Take 1 tablet (5 mg total) by mouth daily. For  overactive bladder  Dispense: 90 tablet; Refill: 3  6. Insomnia due to other mental disorder Comments: we previously stopped her nighttime dose of wellbutrin and added seroquel - it has helped her sleep better  - QUEtiapine (SEROQUEL) 25 MG tablet; Take 0.5-1 tablets (12.5-25 mg total) by mouth at bedtime as needed (for insomnia).  Dispense: 90 tablet; Refill: 1 - AMB Referral to [facility] Coordinaton  7. Mixed Alzheimer's and vascular dementia (HCC) Comments: per [facility] neurology  8. Current mild episode of major depressive disorder, unspecified whether recurrent (HCC) Comments: continue citalopram - change wellbutrin back- IR wellbutrin, she did not like 12 h wellbutrin, did well previously with 150 bid, try 150 mg in am, 75 mg midday - buPROPion (WELLBUTRIN) 75 MG tablet; Take 2 tablets po in the am with breakfast and 1 tablet po in afternoon with lunch, for moods and smoking cessation  Dispense: 270 tablet; Refill: 1 - QUEtiapine (SEROQUEL) 25 MG tablet; Take 0.5-1 tablets (12.5-25 mg total) by mouth at bedtime as needed (for insomnia).  Dispense: 90 tablet; Refill: 1 - citalopram (CELEXA) 10 MG tablet; Take 1 tablet (10 mg total) by mouth in the morning.  Dispense: 90 tablet; Refill: 3  9. Seasonal allergies Comments: con't claritin - loratadine (CLARITIN) 10 MG tablet; Take 1 tablet (10 mg total)  by mouth at bedtime. For allergies  Dispense: 90 tablet; Refill: 3  10. Gastroesophageal reflux disease, unspecified whether esophagitis present Comments: well controlled GI sx, no recurrent abd discomfort, reflux, N/V, continue protonix taken in the evening (to avoid interaction with morning thyroid meds) - pantoprazole (PROTONIX) 20 MG tablet; Take 1 tablet (20 mg total) by mouth at bedtime. For GERD/GI symptoms  Dispense: 90 tablet; Refill: 3  11. Tobacco dependence Comments: 6-7 cigarettes daily  12. Class 1 obesity with serious comorbidity and body mass index (BMI) of 34.0 to  34.9 in adult, unspecified obesity type  13. Encounter for screening mammogram for malignant neoplasm of breast Comments: overdue for mammogram - last noted in chart/care everywhere 2018 - MM 3D SCREEN BREAST BILATERAL; Future  14. Osteopenia, unspecified location Comments: last Dexa 2017, recommend f/up dexa   15. Cirrhosis of liver without ascites, unspecified hepatic cirrhosis type (HCC) Comments: [facility]- est care locally, [facility], hx of hepatitis, completed tx, did f/up with Dr. Servando SnareWohl 2021, f/up MRI neg  16. Encounter for medication monitoring - COMPLETE METABOLIC PANEL WITH GFR - Hemoglobin A1c - TSH  17. History of hepatitis C Comments: [facility], cleared/Hep C undetectable in 2019 - now est with [facility] for monitoring  18. Abnormality of gait and mobility Comments: imbalance and hx of falls - per neuro, recommends pt continue to use rolling walker and no driving  19. Postmenopausal estrogen deficiency - DG Bone Density; Future  20. Screening for malignant neoplasm of colon - Cologuard  Health Maintenance  Topic Date Due  . DEXA SCAN  12/18/2017  . MAMMOGRAM  03/10/2018  . COLON CANCER SCREENING ANNUAL FOBT  12/20/2019  . TETANUS/TDAP  06/02/2020 (Originally 04/05/1965)  . INFLUENZA VACCINE  07/03/2020 (Originally 11/04/2019)  . COLONOSCOPY (Pts 45-4652yrs Insurance coverage will need to be confirmed)  05/01/2021 (Originally 04/06/1991)  . OPHTHALMOLOGY EXAM  07/04/2020  . FOOT EXAM  07/23/2020  . HEMOGLOBIN A1C  07/31/2020  . COVID-19 Vaccine (3 - Booster for Moderna series) 10/28/2020  . PNA vac Low Risk Adult  Completed   She has refused most screening and vaccinations - have reviewed [facility] chart and last screening extensively today - will attempt to get pt caught up - she previously had [facility] monitoring for colon CA with hemoccult - last done 12/2018, will offer/order cologuard or see if she's willing to do colonoscopy  with Dr. Oneida AlarWohl Dexa and mammo due  Return in about 3 months (around 07/30/2020) for Routine follow-up- in office thyroid HLD DM.   Danelle BerryLeisa Niko Penson, PA-C 05/01/20 10:31 AM

## 2020-05-02 ENCOUNTER — Telehealth: Payer: Self-pay | Admitting: *Deleted

## 2020-05-02 ENCOUNTER — Other Ambulatory Visit: Payer: Self-pay | Admitting: Family Medicine

## 2020-05-02 ENCOUNTER — Telehealth: Payer: Self-pay

## 2020-05-02 DIAGNOSIS — E039 Hypothyroidism, unspecified: Secondary | ICD-10-CM

## 2020-05-02 LAB — COMPLETE METABOLIC PANEL WITH GFR
AG Ratio: 1.3 (calc) (ref 1.0–2.5)
ALT: 15 U/L (ref 6–29)
AST: 19 U/L (ref 10–35)
Albumin: 4.2 g/dL (ref 3.6–5.1)
Alkaline phosphatase (APISO): 138 U/L (ref 37–153)
BUN: 14 mg/dL (ref 7–25)
CO2: 27 mmol/L (ref 20–32)
Calcium: 9.3 mg/dL (ref 8.6–10.4)
Chloride: 107 mmol/L (ref 98–110)
Creat: 0.86 mg/dL (ref 0.60–0.93)
GFR, Est African American: 77 mL/min/{1.73_m2} (ref >=60)
GFR, Est Non African American: 67 mL/min/{1.73_m2} (ref >=60)
Globulin: 3.2 g/dL (calc) (ref 1.9–3.7)
Glucose, Bld: 103 mg/dL — ABNORMAL HIGH (ref 65–99)
Potassium: 4.4 mmol/L (ref 3.5–5.3)
Sodium: 141 mmol/L (ref 135–146)
Total Bilirubin: 0.4 mg/dL (ref 0.2–1.2)
Total Protein: 7.4 g/dL (ref 6.1–8.1)

## 2020-05-02 LAB — HEMOGLOBIN A1C
Hgb A1c MFr Bld: 6 % of total Hgb — ABNORMAL HIGH (ref <5.7)
Mean Plasma Glucose: 126 mg/dL
eAG (mmol/L): 7 mmol/L

## 2020-05-02 LAB — TSH: TSH: 0.03 mIU/L — ABNORMAL LOW (ref 0.40–4.50)

## 2020-05-02 MED ORDER — LEVOTHYROXINE SODIUM 13 MCG PO CAPS: 1.0000 | ORAL_CAPSULE | ORAL | 3 refills | Status: DC

## 2020-05-02 MED ORDER — LEVOTHYROXINE SODIUM 112 MCG PO TABS: 112.0000 ug | ORAL_TABLET | Freq: Every day | ORAL | 3 refills | Status: DC

## 2020-05-02 NOTE — Telephone Encounter (Signed)
Rx already sent.

## 2020-05-02 NOTE — Chronic Care Management (AMB) (Signed)
  Chronic Care Management   Outreach Note  05/02/2020 Name: Katherine Stevens MRN: [id number] DOB: 03-17-1946  Katherine Stevens is a 75 y.o. year old female who is a primary care patient of Danelle Berry, [place]. I reached out to [name] by phone today in response to a referral sent by Ms. Talbert Forest Pfarr's PCP, Danelle Berry, PA-C.  A telephone outreach was attempted today patient ask for a return call. The patient was referred to the case management team for assistance with care management and care coordination.   Follow Up Plan: The care management team will reach out to the patient again over the next 7 days. If patient returns call to provider office, please advise to call Embedded Care Management Care Guide Gwenevere Ghazi at [phone].  Gwenevere Ghazi  Care Guide, Embedded Care Coordination [facility] Management

## 2020-05-02 NOTE — Telephone Encounter (Signed)
Copied from CRM [id number]. Topic: General - Inquiry >> May 01, 2020  4:15 PM Adrian Prince D wrote: Reason for CRM: Patient called and stated that she didn't want her prescriptions sent to [facility] but to [facility], she tried to get the prescriptions transferred and the pharmacist at [facility] stated that transferred  prescriptions could take up to a week and some of the medications, Mrs. Winiarski needs now. The pharmacist ask that you send new prescriptions for all of the patients medications. She would like a call back when they have been sent so she can go to [facility] and pick them up. She can be reached at [phone].Please advise

## 2020-05-07 NOTE — Chronic Care Management (AMB) (Signed)
  Chronic Care Management   Note  05/07/2020 Name: Baylie Drakes MRN: [id number] DOB: [birth year]  Katherine Stevens is a 75 y.o. year old female who is a primary care patient of Delsa Grana, [place]. I reached out to Burna Cash by phone today in response to a referral sent by Ms. Enid Derry Jamerson's PCP, Delsa Grana, PA-C.  Ms. Hosie was given information about Chronic Care Management services today including:  1. CCM service includes personalized support from designated clinical staff supervised by her physician, including individualized plan of care and coordination with other care providers 2. 24/7 contact phone numbers for assistance for urgent and routine care needs. 3. Service will only be billed when office clinical staff spend 20 minutes or more in a month to coordinate care. 4. Only one practitioner may furnish and bill the service in a calendar month. 5. The patient may stop CCM services at any time (effective at the end of the month) by phone call to the office staff. 6. The patient will be responsible for cost sharing (co-pay) of up to 20% of the service fee (after annual deductible is met).  Patient did not agree to enrollment in care management services and does not wish to consider at this time.  Follow up plan: The care management team is available to follow up with the patient after provider conversation with the patient regarding recommendation for care management engagement and subsequent re-referral to the care management team.   [facility] Management

## 2020-06-02 ENCOUNTER — Ambulatory Visit: Payer: Medicare Other | Admitting: Family Medicine

## 2020-06-12 ENCOUNTER — Other Ambulatory Visit: Payer: Self-pay

## 2020-06-12 MED ORDER — LEVOTHYROXINE SODIUM 25 MCG PO TABS: 12.5000 ug | ORAL_TABLET | ORAL | 0 refills | Status: DC

## 2020-06-27 ENCOUNTER — Telehealth: Payer: Self-pay | Admitting: Family Medicine

## 2020-06-27 NOTE — Telephone Encounter (Signed)
Copied from CRM [id number]. Topic: Medicare AWV >> Jun 27, 2020 11:41 AM Claudette Laws R wrote: Reason for CRM:   Left message for patient to call back and schedule Medicare Annual Wellness Visit (AWV) in office.   If unable to come into the office for AWV,  please offer to do virtually or by telephone.  Last AWV: 07/03/2019  Please schedule at anytime with [facility] Health Advisor.  40 minute appointment  Any questions, please contact me at [phone]

## 2020-07-08 ENCOUNTER — Ambulatory Visit (INDEPENDENT_AMBULATORY_CARE_PROVIDER_SITE_OTHER): Payer: Medicare Other

## 2020-07-08 DIAGNOSIS — Z0000 Encounter for general adult medical examination without abnormal findings: Secondary | ICD-10-CM | POA: Diagnosis not present

## 2020-07-08 NOTE — Progress Notes (Signed)
Subjective:   Katherine Stevens is a 75 y.o. female who presents for Medicare Annual (Subsequent) preventive examination.  Virtual Visit via Telephone Note  I connected with  Katherine Stevens on 07/08/20 at  9:20 AM EDT by telephone and verified that I am speaking with the correct person using two identifiers.  Location: Patient: home Provider: [place] Persons participating in the virtual visit: [place]   I discussed the limitations, risks, security and privacy concerns of performing an evaluation and management service by telephone and the availability of in person appointments. The patient expressed understanding and agreed to proceed.  Interactive audio and video telecommunications were attempted between this nurse and patient, however failed, due to patient having technical difficulties OR patient did not have access to video capability.  We continued and completed visit with audio only.  Some vital signs may be absent or patient reported.   Clemetine Marker, LPN    Review of Systems     Cardiac Risk Factors include: advanced age (>31mn, >>47women);dyslipidemia;obesity (BMI >30kg/m2);sedentary lifestyle;smoking/ tobacco exposure;hypertension;diabetes mellitus     Objective:    There were no vitals filed for this visit. There is no height or weight on file to calculate BMI.  Advanced Directives 07/08/2020 07/03/2019 03/31/2019  Does Patient Have a Medical Advance Directive? No No No  Would patient like information on creating a medical advance directive? No - Patient declined No - Patient declined -    Current Medications (verified) Outpatient Encounter Medications as of 07/08/2020  Medication Sig  . atorvastatin (LIPITOR) 40 MG tablet Take 1 tablet (40 mg total) by mouth at bedtime. For cholesterol  . buPROPion (WELLBUTRIN) 75 MG tablet Take 2 tablets po in the am with breakfast and 1 tablet po in afternoon with lunch, for moods and smoking cessation  .  citalopram (CELEXA) 10 MG tablet Take 1 tablet (10 mg total) by mouth in the morning.  . donepezil (ARICEPT) 10 MG tablet Take 10 mg by mouth daily.  .Marland Kitchenlevothyroxine (SYNTHROID) 112 MCG tablet Take 1 tablet (112 mcg total) by mouth daily before breakfast. Two days a week take with 13 mcg capsule  . levothyroxine (SYNTHROID) 25 MCG tablet Take 0.5 tablets (12.5 mcg total) by mouth 2 (two) times a week. Take half a pill 12.523m2 days per week  . loratadine (CLARITIN) 10 MG tablet Take 1 tablet (10 mg total) by mouth at bedtime. For allergies  . losartan (COZAAR) 50 MG tablet Take 1 tablet (50 mg total) by mouth every morning. For hypertension  . metFORMIN (GLUCOPHAGE) 500 MG tablet Take 1 tablet (500 mg total) by mouth daily with breakfast. For well controlled diabetes  . oxybutynin (DITROPAN-XL) 5 MG 24 hr tablet Take 1 tablet (5 mg total) by mouth daily. For overactive bladder  . pantoprazole (PROTONIX) 20 MG tablet Take 1 tablet (20 mg total) by mouth at bedtime. For GERD/GI symptoms  . QUEtiapine (SEROQUEL) 25 MG tablet Take 0.5-1 tablets (12.5-25 mg total) by mouth at bedtime as needed (for insomnia).  . Travoprost, BAK Free, (TRAVATAN) 0.004 % SOLN ophthalmic solution 1 drop at bedtime.  . vitamin B-12 (CYANOCOBALAMIN) 1000 MCG tablet Take 1,000 mcg by mouth daily.   No facility-administered encounter medications on file as of 07/08/2020.    Allergies (verified) Lisinopril   History: Past Medical History:  Diagnosis Date  . B12 deficiency   . 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 Depression   .  Diabetes mellitus without complication ([place])   . Hyperlipidemia   . Hypertension   . Memory loss   . Overactive bladder   . Thyroid disease   . Vertigo 04/16/2019   Past Surgical History:  Procedure Laterality Date  . ABDOMINAL HYSTERECTOMY     Family History  Problem Relation Age of Onset  . Heart failure Mother   . Diabetes Mother   . Diabetes  Sister   . Hyperlipidemia Sister   . Thyroid disease Sister   . Stroke Brother   . Heart attack Brother   . Cancer Maternal Grandmother   . Heart attack Brother    Social History   Socioeconomic History  . Marital status: Divorced    Spouse name: Not on file  . Number of children: 2  . Years of education: Not on file  . Highest education level: GED or equivalent  Occupational History  . Not on file  Tobacco Use  . Smoking status: Current Every Day Smoker    Packs/day: 0.25    Types: Cigarettes  . Smokeless tobacco: Never Used  . Tobacco comment: 6-7 cigarettes a day; taking wellbutrin  Vaping Use  . Vaping Use: Never used  Substance and Sexual Activity  . Alcohol use: Not Currently  . Drug use: Not Currently  . Sexual activity: Not Currently  Other Topics Concern  . Not on file  Social History Narrative   Pt lives alone   Social Determinants of Health   Financial Resource Strain: Low Risk   . Difficulty of Paying Living Expenses: Not hard at all  Food Insecurity: No Food Insecurity  . Worried About Charity fundraiser in the Last Year: Never true  . Ran Out of Food in the Last Year: Never true  Transportation Needs: No Transportation Needs  . Lack of Transportation (Medical): No  . Lack of Transportation (Non-Medical): No  Physical Activity: Inactive  . Days of Exercise per Week: 0 days  . Minutes of Exercise per Session: 0 min  Stress: No Stress Concern Present  . Feeling of Stress : Not at all  Social Connections: Socially Isolated  . Frequency of Communication with Friends and Family: More than three times a week  . Frequency of Social Gatherings with Friends and Family: Three times a week  . Attends Religious Services: Never  . Active Member of Clubs or Organizations: No  . Attends Archivist Meetings: Never  . Marital Status: Divorced    Tobacco Counseling Ready to quit: Not Answered Counseling given: Not Answered Comment: 6-7 cigarettes a  day; taking wellbutrin   Clinical Intake:  Pre-visit preparation completed: Yes  Pain : No/denies pain     Nutritional Risks: None Diabetes: Yes CBG done?: No Did pt. bring in CBG monitor from home?: No  How often do you need to have someone help you when you read instructions, pamphlets, or other written materials from your doctor or pharmacy?: 1 - Never  Nutrition Risk Assessment:  Has the patient had any N/V/D within the last 2 months?  No  Does the patient have any non-healing wounds?  No  Has the patient had any unintentional weight loss or weight gain?  No   Diabetes:  Is the patient diabetic?  Yes  If diabetic, was a CBG obtained today?  No  Did the patient bring in their glucometer from home?  No  How often do you monitor your CBG's? Twice weekly per patient.   Financial Strains and Diabetes  Management:  Are you having any financial strains with the device, your supplies or your medication? No .  Does the patient want to be seen by Chronic Care Management for management of their diabetes?  No  Would the patient like to be referred to a Nutritionist or for Diabetic Management?  No   Diabetic Exams:  Diabetic Eye Exam: Completed 07/05/19. Overdue for diabetic eye exam. Pt has been advised about the importance in completing this exam. Pt states she has upcoming appt.   Diabetic Foot Exam: Completed 07/24/19.   Interpreter Needed?: No  Information entered by :: Clemetine Marker LPN   Activities of Daily Living In your present state of health, do you have any difficulty performing the following activities: 07/08/2020 05/01/2020  Hearing? N N  Comment declines hearing aids -  Vision? N Y  Difficulty concentrating or making decisions? N N  Walking or climbing stairs? Y Y  Dressing or bathing? N N  Doing errands, shopping? N -  Preparing Food and eating ? N -  Using the Toilet? N -  In the past six months, have you accidently leaked urine? Y -  Comment wears pads for  protection on occasion or at night -  Do you have problems with loss of bowel control? N -  Managing your Medications? N -  Managing your Finances? N -  Housekeeping or managing your Housekeeping? N -  Some recent data might be hidden    Patient Care Team: Delsa Grana, PA-C as PCP - General (Family Medicine) Vertell Limber (Neurology) Lucilla Lame, MD as Consulting Physician (Gastroenterology)  Indicate any recent Medical Services you may have received from other than [facility] providers in the past year (date may be approximate).     Assessment:   This is a routine wellness examination for [place].  Hearing/Vision screen  Hearing Screening   125Hz  250Hz  500Hz  1000Hz  2000Hz  3000Hz  4000Hz  6000Hz  8000Hz   Right ear:           Left ear:           Comments: Pt denies hearing difficulty  Vision Screening Comments: Annual vision screenings at [place] issues and exercise activities discussed: Current Exercise Habits: The patient does not participate in regular exercise at present, Exercise limited by: orthopedic condition(s)  Goals    . Increase physical activity     Recommend increasing physical activity as tolerated       Depression Screen PHQ 2/9 Scores 07/08/2020 05/01/2020 01/31/2020 11/29/2019 09/06/2019 07/24/2019 07/06/2019  PHQ - 2 Score 0 0 2 2 0 0 2  PHQ- 9 Score - 6 - 6 0 0 12    Fall Risk Fall Risk  07/08/2020 05/01/2020 01/31/2020 11/29/2019 09/06/2019  Falls in the past year? 0 0 1 1 0  Number falls in past yr: 0 0 1 1 0  Injury with Fall? 0 0 0 0 0  Comment - - - - -  Risk for fall due to : Impaired balance/gait - History of fall(s) - -  Follow up Falls prevention discussed Falls evaluation completed - Falls evaluation completed -    FALL RISK PREVENTION PERTAINING TO THE HOME:  Any stairs in or around the home? No  If so, are there any without handrails? No  Home free of loose throw rugs in walkways, pet beds, electrical cords, etc? Yes   Adequate lighting in your home to reduce risk of falls? Yes   ASSISTIVE DEVICES UTILIZED TO PREVENT  FALLS:  Life alert? No  Use of a cane, walker or w/c? Yes  Grab bars in the bathroom? Yes  Shower chair or bench in shower? Yes  Elevated toilet seat or a handicapped toilet? Yes   TIMED UP AND GO:  Was the test performed? No . Telephonic visit.   Cognitive Function: Patient has current diagnosis of cognitive impairment. Patient is followed by neurology for ongoing assessment.        6CIT Screen 07/03/2019  What Year? 0 points  What month? 0 points  What time? 0 points  Count back from 20 0 points  Months in reverse 0 points  Repeat phrase 2 points  Total Score 2    Immunizations Immunization History  Administered Date(s) Administered  . Influenza,inj,Quad PF,6+ Mos 03/20/2015, 12/12/2015, 01/20/2017, 02/15/2018, 01/04/2019  . Moderna Sars-Covid-2 Vaccination 03/25/2020, 04/30/2020  . Pneumococcal Conjugate-13 09/13/2013  . Pneumococcal Polysaccharide-23 10/30/2014    TDAP status: Due, Education has been provided regarding the importance of this vaccine. Advised may receive this vaccine at local pharmacy or Health Dept. Aware to provide a copy of the vaccination record if obtained from local pharmacy or Health Dept. Verbalized acceptance and understanding.  Flu Vaccine status: Due, Education has been provided regarding the importance of this vaccine. Advised may receive this vaccine at local pharmacy or Health Dept. Aware to provide a copy of the vaccination record if obtained from local pharmacy or Health Dept. Verbalized acceptance and understanding.  Pneumococcal vaccine status: Up to date  Covid-19 vaccine status: Completed vaccines  Qualifies for Shingles Vaccine? Yes   Zostavax completed No   Shingrix Completed?: No.    Education has been provided regarding the importance of this vaccine. Patient has been advised to call insurance company to determine out of pocket  expense if they have not yet received this vaccine. Advised may also receive vaccine at local pharmacy or Health Dept. Verbalized acceptance and understanding.  Screening Tests Health Maintenance  Topic Date Due  . TETANUS/TDAP  Never done  . DEXA SCAN  12/18/2017  . MAMMOGRAM  03/10/2018  . COLON CANCER SCREENING ANNUAL FOBT  12/20/2019  . OPHTHALMOLOGY EXAM  07/04/2020  . COLONOSCOPY (Pts 45-73yrs Insurance coverage will need to be confirmed)  05/01/2021 (Originally 04/06/1990)  . FOOT EXAM  07/23/2020  . COVID-19 Vaccine (3 - Booster for Moderna series) 10/28/2020  . HEMOGLOBIN A1C  10/29/2020  . INFLUENZA VACCINE  11/03/2020  . PNA vac Low Risk Adult  Completed  . HPV VACCINES  Aged Out    Health Maintenance  Health Maintenance Due  Topic Date Due  . TETANUS/TDAP  Never done  . DEXA SCAN  12/18/2017  . MAMMOGRAM  03/10/2018  . COLON CANCER SCREENING ANNUAL FOBT  12/20/2019  . OPHTHALMOLOGY EXAM  07/04/2020    Colorectal cancer screening: Type of screening: FOBT/FIT. Completed 07/05/19. Repeat every 1 years. Pt also states she has a cologuard kit at home to be completed.   Mammogram status: Completed 03/10/17. Repeat every year  Bone Density status: Completed 12/19/15. Results reflect: Bone density results: OSTEOPENIA. Repeat every 2 years.  Lung Cancer Screening: (Low Dose CT Chest recommended if Age 10-80 years, 30 pack-year currently smoking OR have quit w/in 15years.) does not qualify.  Additional Screening:  Hepatitis C Screening: does not qualify; hx of hep C  Vision Screening: Recommended annual ophthalmology exams for early detection of glaucoma and other disorders of the eye. Is the patient up to date with their annual eye exam?  Yes  Who is the provider or what is the name of the office in which the patient attends annual eye exams? [place] Screening: Recommended annual dental exams for proper oral hygiene  Community Resource Referral /  Chronic Care Management: CRR required this visit?  No   CCM required this visit?  No      Plan:     I have personally reviewed and noted the following in the patient's chart:   . Medical and social history . Use of alcohol, tobacco or illicit drugs  . Current medications and supplements . Functional ability and status . Nutritional status . Physical activity . Advanced directives . List of other physicians . Hospitalizations, surgeries, and ER visits in previous 12 months . Vitals . Screenings to include cognitive, depression, and falls . Referrals and appointments  In addition, I have reviewed and discussed with patient certain preventive protocols, quality metrics, and best practice recommendations. A written personalized care plan for preventive services as well as general preventive health recommendations were provided to patient.     Clemetine Marker, LPN   05/12/2534   Nurse Notes: none

## 2020-07-08 NOTE — Patient Instructions (Signed)
Katherine Stevens , Thank you for taking time to come for your Medicare Wellness Visit. I appreciate your ongoing commitment to your health goals. Please review the following plan we discussed and let me know if I can assist you in the future.   Screening recommendations/referrals: Colonoscopy: Please complete Cologuard kit and return.  Mammogram: done 03/10/17. Please call [phone] to schedule your mammogram and bone density screening.  Bone Density: done 12/19/15.  Recommended yearly ophthalmology/optometry visit for glaucoma screening and checkup Recommended yearly dental visit for hygiene and checkup  Vaccinations: Influenza vaccine: due Pneumococcal vaccine: done 10/30/14 Tdap vaccine: due Shingles vaccine: Shingrix discussed. Please contact your pharmacy for coverage information.  Covid-19:done 03/25/20 & 04/30/20  Advanced directives: Advance directive discussed with you today. Even though you declined this today please call our office should you change your mind and we can give you the proper paperwork for you to fill out.  Conditions/risks identified: Recommend continuing fall prevention in the home.   Next appointment: Follow up in one year for your annual wellness visit    Preventive Care 65 Years and Older, Female Preventive care refers to lifestyle choices and visits with your health care provider that can promote health and wellness. What does preventive care include?  A yearly physical exam. This is also called an annual well check.  Dental exams once or twice a year.  Routine eye exams. Ask your health care provider how often you should have your eyes checked.  Personal lifestyle choices, including:  Daily care of your teeth and gums.  Regular physical activity.  Eating a healthy diet.  Avoiding tobacco and drug use.  Limiting alcohol use.  Practicing safe sex.  Taking low-dose aspirin every day.  Taking vitamin and mineral supplements as recommended by  your health care provider. What happens during an annual well check? The services and screenings done by your health care provider during your annual well check will depend on your age, overall health, lifestyle risk factors, and family history of disease. Counseling  Your health care provider may ask you questions about your:  Alcohol use.  Tobacco use.  Drug use.  Emotional well-being.  Home and relationship well-being.  Sexual activity.  Eating habits.  History of falls.  Memory and ability to understand (cognition).  Work and work Statistician.  Reproductive health. Screening  You may have the following tests or measurements:  Height, weight, and BMI.  Blood pressure.  Lipid and cholesterol levels. These may be checked every 5 years, or more frequently if you are over 50 years old.  Skin check.  Lung cancer screening. You may have this screening every year starting at age 28 if you have a 30-pack-year history of smoking and currently smoke or have quit within the past 15 years.  Fecal occult blood test (FOBT) of the stool. You may have this test every year starting at age 53.  Flexible sigmoidoscopy or colonoscopy. You may have a sigmoidoscopy every 5 years or a colonoscopy every 10 years starting at age 36.  Hepatitis C blood test.  Hepatitis B blood test.  Sexually transmitted disease (STD) testing.  Diabetes screening. This is done by checking your blood sugar (glucose) after you have not eaten for a while (fasting). You may have this done every 1-3 years.  Bone density scan. This is done to screen for osteoporosis. You may have this done starting at age 27.  Mammogram. This may be done every 1-2 years. Talk to your health care provider  about how often you should have regular mammograms. Talk with your health care provider about your test results, treatment options, and if necessary, the need for more tests. Vaccines  Your health care provider may  recommend certain vaccines, such as:  Influenza vaccine. This is recommended every year.  Tetanus, diphtheria, and acellular pertussis (Tdap, Td) vaccine. You may need a Td booster every 10 years.  Zoster vaccine. You may need this after age 1.  Pneumococcal 13-valent conjugate (PCV13) vaccine. One dose is recommended after age 69.  Pneumococcal polysaccharide (PPSV23) vaccine. One dose is recommended after age 50. Talk to your health care provider about which screenings and vaccines you need and how often you need them. This information is not intended to replace advice given to you by your health care provider. Make sure you discuss any questions you have with your health care provider. Document Released: 04/18/2015 Document Revised: 12/10/2015 Document Reviewed: 01/21/2015 Elsevier Interactive Patient Education  2017 [place] Prevention in the Home Falls can cause injuries. They can happen to people of all ages. There are many things you can do to make your home safe and to help prevent falls. What can I do on the outside of my home?  Regularly fix the edges of walkways and driveways and fix any cracks.  Remove anything that might make you trip as you walk through a door, such as a raised step or threshold.  Trim any bushes or trees on the path to your home.  Use bright outdoor lighting.  Clear any walking paths of anything that might make someone trip, such as rocks or tools.  Regularly check to see if handrails are loose or broken. Make sure that both sides of any steps have handrails.  Any raised decks and porches should have guardrails on the edges.  Have any leaves, snow, or ice cleared regularly.  Use sand or salt on walking paths during winter.  Clean up any spills in your garage right away. This includes oil or grease spills. What can I do in the bathroom?  Use night lights.  Install grab bars by the toilet and in the tub and shower. Do not use towel  bars as grab bars.  Use non-skid mats or decals in the tub or shower.  If you need to sit down in the shower, use a plastic, non-slip stool.  Keep the floor dry. Clean up any water that spills on the floor as soon as it happens.  Remove soap buildup in the tub or shower regularly.  Attach bath mats securely with double-sided non-slip rug tape.  Do not have throw rugs and other things on the floor that can make you trip. What can I do in the bedroom?  Use night lights.  Make sure that you have a light by your bed that is easy to reach.  Do not use any sheets or blankets that are too big for your bed. They should not hang down onto the floor.  Have a firm chair that has side arms. You can use this for support while you get dressed.  Do not have throw rugs and other things on the floor that can make you trip. What can I do in the kitchen?  Clean up any spills right away.  Avoid walking on wet floors.  Keep items that you use a lot in easy-to-reach places.  If you need to reach something above you, use a strong step stool that has a grab bar.  Keep electrical cords out of the way.  Do not use floor polish or wax that makes floors slippery. If you must use wax, use non-skid floor wax.  Do not have throw rugs and other things on the floor that can make you trip. What can I do with my stairs?  Do not leave any items on the stairs.  Make sure that there are handrails on both sides of the stairs and use them. Fix handrails that are broken or loose. Make sure that handrails are as long as the stairways.  Check any carpeting to make sure that it is firmly attached to the stairs. Fix any carpet that is loose or worn.  Avoid having throw rugs at the top or bottom of the stairs. If you do have throw rugs, attach them to the floor with carpet tape.  Make sure that you have a light switch at the top of the stairs and the bottom of the stairs. If you do not have them, ask someone to  add them for you. What else can I do to help prevent falls?  Wear shoes that:  Do not have high heels.  Have rubber bottoms.  Are comfortable and fit you well.  Are closed at the toe. Do not wear sandals.  If you use a stepladder:  Make sure that it is fully opened. Do not climb a closed stepladder.  Make sure that both sides of the stepladder are locked into place.  Ask someone to hold it for you, if possible.  Clearly mark and make sure that you can see:  Any grab bars or handrails.  First and last steps.  Where the edge of each step is.  Use tools that help you move around (mobility aids) if they are needed. These include:  Canes.  Walkers.  Scooters.  Crutches.  Turn on the lights when you go into a dark area. Replace any light bulbs as soon as they burn out.  Set up your furniture so you have a clear path. Avoid moving your furniture around.  If any of your floors are uneven, fix them.  If there are any pets around you, be aware of where they are.  Review your medicines with your doctor. Some medicines can make you feel dizzy. This can increase your chance of falling. Ask your doctor what other things that you can do to help prevent falls. This information is not intended to replace advice given to you by your health care provider. Make sure you discuss any questions you have with your health care provider. Document Released: 01/16/2009 Document Revised: 08/28/2015 Document Reviewed: 04/26/2014 Elsevier Interactive Patient Education  2017 [employer].

## 2020-08-29 ENCOUNTER — Ambulatory Visit: Payer: Medicare Other | Admitting: Family Medicine

## 2020-09-09 ENCOUNTER — Ambulatory Visit: Payer: Self-pay | Admitting: Family Medicine

## 2020-09-17 ENCOUNTER — Other Ambulatory Visit: Payer: Self-pay | Admitting: Family Medicine

## 2020-10-09 ENCOUNTER — Other Ambulatory Visit: Payer: Self-pay

## 2020-10-09 ENCOUNTER — Encounter: Payer: Self-pay | Admitting: Family Medicine

## 2020-10-09 ENCOUNTER — Ambulatory Visit (INDEPENDENT_AMBULATORY_CARE_PROVIDER_SITE_OTHER): Payer: Medicare Other | Admitting: Family Medicine

## 2020-10-09 VITALS — BP 138/78 | HR 84 | Temp 99.1°F | Resp 16 | Ht 66.0 in | Wt 231.2 lb

## 2020-10-09 DIAGNOSIS — Z1211 Encounter for screening for malignant neoplasm of colon: Secondary | ICD-10-CM

## 2020-10-09 DIAGNOSIS — E039 Hypothyroidism, unspecified: Secondary | ICD-10-CM

## 2020-10-09 DIAGNOSIS — G309 Alzheimer's disease, unspecified: Secondary | ICD-10-CM

## 2020-10-09 DIAGNOSIS — I10 Essential (primary) hypertension: Secondary | ICD-10-CM

## 2020-10-09 DIAGNOSIS — E119 Type 2 diabetes mellitus without complications: Secondary | ICD-10-CM

## 2020-10-09 DIAGNOSIS — F0280 Dementia in other diseases classified elsewhere without behavioral disturbance: Secondary | ICD-10-CM

## 2020-10-09 DIAGNOSIS — F5105 Insomnia due to other mental disorder: Secondary | ICD-10-CM

## 2020-10-09 DIAGNOSIS — E785 Hyperlipidemia, unspecified: Secondary | ICD-10-CM | POA: Diagnosis not present

## 2020-10-09 DIAGNOSIS — E559 Vitamin D deficiency, unspecified: Secondary | ICD-10-CM

## 2020-10-09 DIAGNOSIS — J302 Other seasonal allergic rhinitis: Secondary | ICD-10-CM

## 2020-10-09 DIAGNOSIS — F0150 Vascular dementia without behavioral disturbance: Secondary | ICD-10-CM

## 2020-10-09 DIAGNOSIS — K219 Gastro-esophageal reflux disease without esophagitis: Secondary | ICD-10-CM

## 2020-10-09 DIAGNOSIS — F99 Mental disorder, not otherwise specified: Secondary | ICD-10-CM

## 2020-10-09 DIAGNOSIS — E538 Deficiency of other specified B group vitamins: Secondary | ICD-10-CM

## 2020-10-09 DIAGNOSIS — F320 Major depressive disorder, single episode, mild: Secondary | ICD-10-CM

## 2020-10-09 DIAGNOSIS — Z23 Encounter for immunization: Secondary | ICD-10-CM

## 2020-10-09 MED ORDER — DONEPEZIL HCL 10 MG PO TABS: 10.0000 mg | ORAL_TABLET | Freq: Every day | ORAL | 0 refills | Status: DC

## 2020-10-09 MED ORDER — ZOSTER VAC RECOMB ADJUVANTED 50 MCG/0.5ML IM SUSR: 0.5000 mL | Freq: Once | INTRAMUSCULAR | 1 refills | Status: AC

## 2020-10-09 NOTE — Progress Notes (Signed)
Name: Katherine Stevens   MRN: [id number]    DOB: 1945/11/28   Date:10/09/2020       Progress Note  Chief Complaint  Patient presents with   Follow-up     Subjective:   Katherine Stevens is a 75 y.o. female, presents to clinic for routine f/up  Hypothyroid - has been difficult to get TSH in normal range Med and dose changes and compliance unclear with hx of dementia, pt does not consistently come with family member or caregive and she does not bring meds with her. Hypothyroidism: History: hypothyroidism Current Medication Regimen: 112 mcg 5 d a week and 112 +13 mcg + 125 mcg levothyroxine 2 d a week Current Symptoms: fatigue and anxiousness, no weight/hair/skin change Most recent results are below; we will be repeating labs today. Lab Results  Component Value Date   TSH 0.03 (L) 05/01/2020   DM:   Pt managing DM with metformin 500 mg once daily Reports good med compliance Pt has no SE from meds. Blood sugars not checking Denies: Polyuria, polydipsia, vision changes, neuropathy, hypoglycemia Recent pertinent labs: Lab Results  Component Value Date   HGBA1C 6.0 (H) 05/01/2020   HGBA1C 5.9 (H) 01/31/2020   HGBA1C 5.6 07/24/2019   Standard of care and health maintenance: Urine Microalbumin:  not indicated Foot exam:  due today DM eye exam:  due- [facility]? Doesn't remember name ACEI/ARB:  losartan Statin:  lipitor  Hyperlipidemia: Currently treated with lipitor 40, pt reports good med compliance Last Lipids: Lab Results  Component Value Date   CHOL 108 07/24/2019   HDL 54 07/24/2019   LDLCALC 39 07/24/2019   TRIG 71 07/24/2019   CHOLHDL 2.0 07/24/2019   - Denies: Chest pain, shortness of breath, myalgias, claudication  Hypertension:  Currently managed on losartan 50 mg once daily  Pt reports good med compliance and denies any SE.   Blood pressure today is well controlled. BP Readings from Last 3 Encounters:  10/09/20 138/78  05/01/20 138/82  01/31/20 130/80    Pt denies CP, SOB, exertional sx, LE edema, palpitation, Ha's, visual disturbances, lightheadedness, hypotension, syncope. Dietary efforts for BP?  none   Dementia - per [facility] neuro on aricept- out of meds for at least 3 months - no recent neuro f/up per pt B1, B12 deficiency on oral supplement  Anxiety/MDD/insomnia - managed on celexa 10 mg, wellbutrin 75 mg BID morning and early afternoon (added in the past by prior PCP for smoking cessation) and seroquel at bedtime for sleep Wellbutrin when taken in the past at bedtime was causing worse insomnia- seemed better when med time was changed Today PHQ score much higher than previous - noted increased depression, sleepiness, decreased energy, altered sleep   Depression screen [facility] 2/9 10/09/2020 07/08/2020 05/01/2020  Decreased Interest 3 0 0  Down, Depressed, Hopeless 3 0 0  PHQ - 2 Score 6 0 0  Altered sleeping 3 - 3  Tired, decreased energy 3 - 2  Change in appetite 1 - 0  Feeling bad or failure about yourself  1 - 0  Trouble concentrating 1 - 1  Moving slowly or fidgety/restless 1 - 0  Suicidal thoughts 0 - 0  PHQ-9 Score 16 - 6  Difficult doing work/chores Somewhat difficult - Somewhat difficult  Some recent data might be hidden   GAD 7 : Generalized Anxiety Score 10/09/2020 05/01/2020  Nervous, Anxious, on Edge 3 0  Control/stop worrying 2 0  Worry too much - different things 1 0  Trouble relaxing 1 0  Restless 0 0  Easily annoyed or irritable 0 0  Afraid - awful might happen 2 0  Total GAD 7 Score 9 0  Anxiety Difficulty Somewhat difficult Not difficult at all   Anxiety sx worse - she feels anxious at night, still taking meds the same way, no other changes   OAB - on oxybutynin daily - seems to keep sx well controlled, no change or progression to urinary sx  AR - on claritin      Current Outpatient Medications:    atorvastatin (LIPITOR) 40 MG tablet, Take 1 tablet (40 mg total) by mouth at bedtime. For cholesterol,  Disp: 90 tablet, Rfl: 3   buPROPion (WELLBUTRIN) 75 MG tablet, Take 2 tablets po in the am with breakfast and 1 tablet po in afternoon with lunch, for moods and smoking cessation, Disp: 270 tablet, Rfl: 1   citalopram (CELEXA) 10 MG tablet, Take 1 tablet (10 mg total) by mouth in the morning., Disp: 90 tablet, Rfl: 3   donepezil (ARICEPT) 10 MG tablet, Take 10 mg by mouth daily., Disp: , Rfl:    levothyroxine (SYNTHROID) 112 MCG tablet, Take 1 tablet (112 mcg total) by mouth daily before breakfast. Two days a week take with 13 mcg capsule, Disp: 90 tablet, Rfl: 3   levothyroxine (SYNTHROID) 25 MCG tablet, TAKE 1/2 TABLET BY MOUTH 2 TIMES A WEEK, Disp: 12 tablet, Rfl: 0   loratadine (CLARITIN) 10 MG tablet, Take 1 tablet (10 mg total) by mouth at bedtime. For allergies, Disp: 90 tablet, Rfl: 3   losartan (COZAAR) 50 MG tablet, Take 1 tablet (50 mg total) by mouth every morning. For hypertension, Disp: 90 tablet, Rfl: 3   metFORMIN (GLUCOPHAGE) 500 MG tablet, Take 1 tablet (500 mg total) by mouth daily with breakfast. For well controlled diabetes, Disp: 90 tablet, Rfl: 3   oxybutynin (DITROPAN-XL) 5 MG 24 hr tablet, Take 1 tablet (5 mg total) by mouth daily. For overactive bladder, Disp: 90 tablet, Rfl: 3   pantoprazole (PROTONIX) 20 MG tablet, Take 1 tablet (20 mg total) by mouth at bedtime. For GERD/GI symptoms, Disp: 90 tablet, Rfl: 3   QUEtiapine (SEROQUEL) 25 MG tablet, Take 0.5-1 tablets (12.5-25 mg total) by mouth at bedtime as needed (for insomnia)., Disp: 90 tablet, Rfl: 1   Travoprost, BAK Free, (TRAVATAN) 0.004 % SOLN ophthalmic solution, 1 drop at bedtime., Disp: , Rfl:    vitamin B-12 (CYANOCOBALAMIN) 1000 MCG tablet, Take 1,000 mcg by mouth daily., Disp: , Rfl:   Patient Active Problem List   Diagnosis Date Noted   Insomnia due to other mental disorder 05/01/2020   OAB (overactive bladder) 05/01/2020   Osteopenia 05/01/2020   History of hepatitis C 05/01/2020   Abnormality of gait  and mobility 05/01/2020   Current mild episode of major depressive disorder (HCC) 04/16/2019   Type 2 diabetes mellitus without complication, without long-term current use of insulin (HCC) 04/16/2019   Cirrhosis of liver without ascites (HCC) 04/16/2019   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/16/2019   Folate deficiency 04/16/2019   Vitamin B1 deficiency 04/16/2019   GERD (gastroesophageal reflux disease) 11/22/2018   Tobacco dependence 12/12/2015   Hyperlipidemia 12/10/2014   Osteoarthritis of both knees 08/01/2012   Seasonal allergies 07/28/2012   Class 1 obesity with serious comorbidity and body mass index (BMI) of 34.0 to 34.9 in adult 07/29/2011   Hypothyroidism 12/20/2008   Hypertension, benign 12/20/2008    Past Surgical History:  Procedure Laterality Date   ABDOMINAL HYSTERECTOMY      Family History  Problem Relation Age of Onset   Heart failure Mother    Diabetes Mother    Diabetes Sister    Hyperlipidemia Sister    Thyroid disease Sister    Stroke Brother    Heart attack Brother    Cancer Maternal Grandmother    Heart attack Brother     Social History   Tobacco Use   Smoking status: Every Day    Packs/day: 0.25    Pack years: 0.00    Types: Cigarettes   Smokeless tobacco: Never   Tobacco comments:    6-7 cigarettes a day; taking wellbutrin  Vaping Use   Vaping Use: Never used  Substance Use Topics   Alcohol use: Not Currently   Drug use: Not Currently     Allergies  Allergen Reactions   Lisinopril Anaphylaxis    MOUTH & JAW SWELLING    Health Maintenance  Topic Date Due   Zoster Vaccines- Shingrix (1 of 2) Never done   DEXA SCAN  12/18/2017   MAMMOGRAM  03/10/2018   OPHTHALMOLOGY EXAM  07/04/2020   COVID-19 Vaccine (3 - Booster for Moderna series) 09/28/2020   COLONOSCOPY (Pts 45-53yrs Insurance coverage will need to be confirmed)  05/01/2021 (Originally 04/06/1991)   HEMOGLOBIN A1C  10/29/2020   INFLUENZA VACCINE  11/03/2020    FOOT EXAM  10/09/2021   PNA vac Low Risk Adult  Completed   HPV VACCINES  Aged Out   TETANUS/TDAP  Discontinued    Chart Review Today: I personally reviewed active problem list, medication list, allergies, family history, social history, health maintenance, notes from last encounter, lab results, imaging with the patient/caregiver today.    Review of Systems  Constitutional: Negative.   HENT: Negative.    Eyes: Negative.   Respiratory: Negative.    Cardiovascular: Negative.   Gastrointestinal: Negative.   Endocrine: Negative.   Genitourinary: Negative.   Musculoskeletal: Negative.   Skin: Negative.   Allergic/Immunologic: Negative.   Neurological: Negative.   Hematological: Negative.   Psychiatric/Behavioral: Negative.    All other systems reviewed and are negative.   Objective:   Vitals:   10/09/20 1132  BP: 138/78  Pulse: 84  Resp: 16  Temp: 99.1 F (37.3 C)  SpO2: 95%  Weight: 231 lb 3.2 oz (104.9 kg)  Height: 5\' 6"  (1.676 m)    Body mass index is 37.32 kg/m.  Physical Exam Vitals and nursing note reviewed.  Constitutional:      General: She is not in acute distress.    Appearance: Normal appearance. She is well-developed. She is obese. She is not ill-appearing, toxic-appearing or diaphoretic.     Interventions: Face mask in place.  HENT:     Head: Normocephalic and atraumatic.     Right Ear: External ear normal.     Left Ear: External ear normal.     Nose: Nose normal.     Mouth/Throat:     Mouth: Mucous membranes are moist.     Pharynx: Oropharynx is clear. No oropharyngeal exudate.  Eyes:     General: Lids are normal. No scleral icterus.       Right eye: No discharge.        Left eye: No discharge.     Conjunctiva/sclera: Conjunctivae normal.  Neck:     Trachea: Phonation normal. No tracheal deviation.  Cardiovascular:     Rate and Rhythm: Normal rate and regular rhythm.  Pulses: Normal pulses.          Radial pulses are 2+ on the right  side and 2+ on the left side.       Posterior tibial pulses are 2+ on the right side and 2+ on the left side.     Heart sounds: Normal heart sounds. No murmur heard.   No friction rub. No gallop.  Pulmonary:     Effort: Pulmonary effort is normal. No respiratory distress.     Breath sounds: Normal breath sounds. No stridor. No wheezing, rhonchi or rales.  Chest:     Chest wall: No tenderness.  Abdominal:     General: Bowel sounds are normal. There is no distension.     Palpations: Abdomen is soft.  Musculoskeletal:        General: No swelling.     Right lower leg: No edema.     Left lower leg: No edema.  Skin:    General: Skin is warm and dry.     Capillary Refill: Capillary refill takes less than 2 seconds.     Coloration: Skin is not jaundiced or pale.     Findings: No lesion or rash.  Neurological:     Mental Status: She is alert. Mental status is at baseline.     Motor: No abnormal muscle tone.     Gait: Gait abnormal (walks with rolling walker).  Psychiatric:        Attention and Perception: Attention normal.        Mood and Affect: Mood normal.        Speech: Speech normal.        Behavior: Behavior normal. Behavior is cooperative.        Thought Content: Thought content does not include homicidal or suicidal ideation. Thought content does not include homicidal or suicidal plan.     Diabetic Foot Exam - Simple   Simple Foot Form Diabetic Foot exam was performed with the following findings: Yes 10/09/2020 11:27 AM  Visual Inspection No deformities, no ulcerations, no other skin breakdown bilaterally: Yes Sensation Testing Intact to touch and monofilament testing bilaterally: Yes Pulse Check Posterior Tibialis and Dorsalis pulse intact bilaterally: Yes Comments    Right 2nd nail thicker and dark, but not ingrown   Assessment & Plan:     ICD-10-CM   1. Hypothyroidism, unspecified type  E03.9 TSH   difficulty with meds adjustments and getting pt euthyroid - recheck  today    2. Primary hypertension  I10 COMPLETE METABOLIC PANEL WITH GFR   BP fairly well controlled today, has been stable and at goal for age, BP <140/90, continue losartan 50 mg daily    3. Hyperlipidemia, unspecified hyperlipidemia type  E78.5 COMPLETE METABOLIC PANEL WITH GFR    Lipid panel   good statin compliance, due for lipids    4. Type 2 diabetes mellitus without complication, without long-term current use of insulin (HCC)  E11.9 Hemoglobin A1C    COMPLETE METABOLIC PANEL WITH GFR    Ambulatory referral to Ophthalmology   stable, well controlled with 500 mg metformin, due for eye exam, foot exam, on statin and ARB    5. Insomnia due to other mental disorder  F51.05    F99    was doing well with holding wellbutrin dose in evening and trial of seroquel at bedtime, worse insomnia recently    6. Mixed Alzheimer's and vascular dementia (HCC)  G30.9 donepezil (ARICEPT) 10 MG tablet   F01.50  F02.80    per neurology on aricept - encouraged her to get f/up appt    7. Current mild episode of major depressive disorder, unspecified whether recurrent (HCC)  F32.0    phq much worse than before - recheck meds for thyroid, depression/insomnia - pt may need to bring in meds to see compliance, no SI/HI    8. B12 deficiency  E53.8 Vitamin B12    CBC with Differential/Platelet   monitoring, last labs were deficient    9. Vitamin D deficiency  E55.9 COMPLETE METABOLIC PANEL WITH GFR    VITAMIN D 25 Hydroxy (Vit-D Deficiency, Fractures)   deficient with risk for osteoporosis and CKD - recheck and adjust supplement    10. Mixed Alzheimer's and vascular dementia (HCC)  G30.9 donepezil (ARICEPT) 10 MG tablet   F01.50    F02.80    out of meds - [facility], one refill put in for her aricept - explained to pt to f/up    11. Need for shingles vaccine  Z23 Zoster Vaccine Adjuvanted [facility](SHINGRIX) injection    12. Screening for malignant neoplasm of colon  Z12.11  Ambulatory referral to Gastroenterology    13. Seasonal allergies  J30.2    well controlled, continue claritin    14. Gastroesophageal reflux disease, unspecified whether esophagitis present  K21.9    sx well controlled, no abd pain, indigestion, bloating, N/V      With worse PHQ and GAD 7 - will first wait for thyroid labs - adjust thyroid meds if needed, if TSH in normal range, increase celexa  Moods may be better and less anxiety if she gets back on aricept?  Close f/up in 6 weeks to recheck thyroid and phq/gad 7 with med changes Discussed plan with pt who is in agreement  May also want to decrease wellbutrin if having more anxiety sx - see if that will help   Reviewed HM: Health Maintenance  Topic Date Due   Zoster Vaccines- Shingrix (1 of 2) Never done   DEXA SCAN  12/18/2017   MAMMOGRAM  03/10/2018   OPHTHALMOLOGY EXAM  07/04/2020   COVID-19 Vaccine (3 - Booster for Moderna series) 10/25/2020 (Originally 09/28/2020)   COLONOSCOPY (Pts 45-6237yrs Insurance coverage will need to be confirmed)  05/01/2021 (Originally 04/06/1991)   HEMOGLOBIN A1C  10/29/2020   INFLUENZA VACCINE  11/03/2020   FOOT EXAM  10/09/2021   PNA vac Low Risk Adult  Completed   HPV VACCINES  Aged Out   TETANUS/TDAP  Discontinued   Gave pt number to call for dexa and mammogram Encouraged her to call and get DM eye exam scheduled - referral put in as well Shingrix sent to her pharmacy GI referral put in - due for colon ca screening  Return in about 6 months (around 04/11/2021) for Routine follow-up.   Danelle BerryLeisa Joci Dress, PA-C 10/09/20 11:39 AM

## 2020-10-09 NOTE — Patient Instructions (Addendum)
Please call and schedule your mammogram and bone density screening tests:  [facility] at [facility] [address] [place],  [place]  [address] Get Driving Directions Main: [phone]  BRING ALL YOUR MEDICATION AND YOUR GLUCOSE METER TO YOUR NEXT APPOINTMENT PLEASE  Health Maintenance  Topic Date Due   Tetanus Vaccine  Never done   Zoster (Shingles) Vaccine (1 of 2) Never done   DEXA scan (bone density measurement)  12/18/2017   Mammogram  03/10/2018   Eye exam for diabetics  07/04/2020   COVID-19 Vaccine (3 - Booster for Moderna series) 09/28/2020   Colon Cancer Screening  05/01/2021*   Hemoglobin A1C  10/29/2020   Flu Shot  11/03/2020   Complete foot exam   10/09/2021   Pneumonia vaccines  Completed   HPV Vaccine  Aged Out  *Topic was postponed. The date shown is not the original due date.

## 2020-10-10 ENCOUNTER — Other Ambulatory Visit: Payer: Self-pay | Admitting: Family Medicine

## 2020-10-10 DIAGNOSIS — F320 Major depressive disorder, single episode, mild: Secondary | ICD-10-CM

## 2020-10-10 DIAGNOSIS — F5105 Insomnia due to other mental disorder: Secondary | ICD-10-CM

## 2020-10-10 DIAGNOSIS — F99 Mental disorder, not otherwise specified: Secondary | ICD-10-CM

## 2020-10-10 LAB — COMPLETE METABOLIC PANEL WITH GFR
AG Ratio: 1.2 (calc) (ref 1.0–2.5)
ALT: 11 U/L (ref 6–29)
AST: 17 U/L (ref 10–35)
Albumin: 4.2 g/dL (ref 3.6–5.1)
Alkaline phosphatase (APISO): 140 U/L (ref 37–153)
BUN/Creatinine Ratio: 14 (calc) (ref 6–22)
BUN: 13 mg/dL (ref 7–25)
CO2: 24 mmol/L (ref 20–32)
Calcium: 9.3 mg/dL (ref 8.6–10.4)
Chloride: 110 mmol/L (ref 98–110)
Creat: 0.95 mg/dL — ABNORMAL HIGH (ref 0.60–0.93)
GFR, Est African American: 68 mL/min/{1.73_m2} (ref >=60)
GFR, Est Non African American: 59 mL/min/{1.73_m2} — ABNORMAL LOW (ref >=60)
Globulin: 3.5 g/dL (calc) (ref 1.9–3.7)
Glucose, Bld: 102 mg/dL — ABNORMAL HIGH (ref 65–99)
Potassium: 4.3 mmol/L (ref 3.5–5.3)
Sodium: 142 mmol/L (ref 135–146)
Total Bilirubin: 0.4 mg/dL (ref 0.2–1.2)
Total Protein: 7.7 g/dL (ref 6.1–8.1)

## 2020-10-10 LAB — CBC WITH DIFFERENTIAL/PLATELET
Absolute Monocytes: 684 cells/uL (ref 200–950)
Basophils Absolute: 36 cells/uL (ref 0–200)
Basophils Relative: 0.4 %
Eosinophils Absolute: 198 cells/uL (ref 15–500)
Eosinophils Relative: 2.2 %
HCT: 37.6 % (ref 35.0–45.0)
Hemoglobin: 12.4 g/dL (ref 11.7–15.5)
Lymphs Abs: 3033 cells/uL (ref 850–3900)
MCH: 29.7 pg (ref 27.0–33.0)
MCHC: 33 g/dL (ref 32.0–36.0)
MCV: 90.2 fL (ref 80.0–100.0)
MPV: 9.7 fL (ref 7.5–12.5)
Monocytes Relative: 7.6 %
Neutro Abs: 5049 cells/uL (ref 1500–7800)
Neutrophils Relative %: 56.1 %
Platelets: 273 10*3/uL (ref 140–400)
RBC: 4.17 10*6/uL (ref 3.80–5.10)
RDW: 12.8 % (ref 11.0–15.0)
Total Lymphocyte: 33.7 %
WBC: 9 10*3/uL (ref 3.8–10.8)

## 2020-10-10 LAB — VITAMIN D 25 HYDROXY (VIT D DEFICIENCY, FRACTURES): Vit D, 25-Hydroxy: 21 ng/mL — ABNORMAL LOW (ref 30–100)

## 2020-10-10 LAB — HEMOGLOBIN A1C
Hgb A1c MFr Bld: 6.1 % of total Hgb — ABNORMAL HIGH (ref <5.7)
Mean Plasma Glucose: 128 mg/dL
eAG (mmol/L): 7.1 mmol/L

## 2020-10-10 LAB — LIPID PANEL
Cholesterol: 113 mg/dL (ref <200)
HDL: 59 mg/dL (ref >=50)
LDL Cholesterol (Calc): 38 mg/dL (calc)
Non-HDL Cholesterol (Calc): 54 mg/dL (calc) (ref <130)
Total CHOL/HDL Ratio: 1.9 (calc) (ref <5.0)
Triglycerides: 76 mg/dL (ref <150)

## 2020-10-10 LAB — VITAMIN B12: Vitamin B-12: 507 pg/mL (ref 200–1100)

## 2020-10-10 LAB — TSH: TSH: 0.02 mIU/L — ABNORMAL LOW (ref 0.40–4.50)

## 2020-10-10 MED ORDER — BUPROPION HCL 75 MG PO TABS: 75.0000 mg | ORAL_TABLET | Freq: Two times a day (BID) | ORAL | 1 refills | Status: DC

## 2020-10-10 MED ORDER — QUETIAPINE FUMARATE 25 MG PO TABS: 25.0000 mg | ORAL_TABLET | Freq: Every evening | ORAL | 1 refills | Status: DC | PRN

## 2020-10-10 MED ORDER — CITALOPRAM HYDROBROMIDE 20 MG PO TABS: 20.0000 mg | ORAL_TABLET | Freq: Every morning | ORAL | 1 refills | Status: DC

## 2020-10-14 ENCOUNTER — Other Ambulatory Visit: Payer: Self-pay

## 2020-10-14 DIAGNOSIS — E119 Type 2 diabetes mellitus without complications: Secondary | ICD-10-CM

## 2020-10-14 MED ORDER — NA SULFATE-K SULFATE-MG SULF 17.5-3.13-1.6 GM/177ML PO SOLN: 354.0000 mL | Freq: Once | ORAL | 0 refills | Status: AC

## 2020-10-16 ENCOUNTER — Telehealth: Payer: Self-pay | Admitting: Gastroenterology

## 2020-10-16 NOTE — Telephone Encounter (Signed)
Patient wants to cancel her procedure schedule on 11/13/2020. Clinical will follow up with patient.

## 2020-10-17 NOTE — Telephone Encounter (Addendum)
I spoke to pt and she stated that she did not want to r/s at this time and will call back when she is ready  Endo notified

## 2020-11-13 ENCOUNTER — Ambulatory Visit: Admit: 2020-11-13 | Payer: Medicare Other | Admitting: Gastroenterology

## 2020-11-13 SURGERY — COLONOSCOPY
Anesthesia: General

## 2020-11-20 ENCOUNTER — Ambulatory Visit (INDEPENDENT_AMBULATORY_CARE_PROVIDER_SITE_OTHER): Payer: Medicare Other | Admitting: Family Medicine

## 2020-11-20 ENCOUNTER — Encounter: Payer: Self-pay | Admitting: Family Medicine

## 2020-11-20 ENCOUNTER — Other Ambulatory Visit: Payer: Self-pay

## 2020-11-20 VITALS — BP 122/72 | HR 80 | Temp 98.5°F | Resp 16 | Ht 66.0 in | Wt 230.1 lb

## 2020-11-20 DIAGNOSIS — F99 Mental disorder, not otherwise specified: Secondary | ICD-10-CM

## 2020-11-20 DIAGNOSIS — F5105 Insomnia due to other mental disorder: Secondary | ICD-10-CM

## 2020-11-20 DIAGNOSIS — E039 Hypothyroidism, unspecified: Secondary | ICD-10-CM

## 2020-11-20 DIAGNOSIS — E559 Vitamin D deficiency, unspecified: Secondary | ICD-10-CM | POA: Diagnosis not present

## 2020-11-20 DIAGNOSIS — F419 Anxiety disorder, unspecified: Secondary | ICD-10-CM | POA: Insufficient documentation

## 2020-11-20 DIAGNOSIS — F320 Major depressive disorder, single episode, mild: Secondary | ICD-10-CM | POA: Diagnosis not present

## 2020-11-20 LAB — TSH: TSH: 0.18 mIU/L — ABNORMAL LOW (ref 0.40–4.50)

## 2020-11-20 MED ORDER — VITAMIN D3 50 MCG (2000 UT) PO CAPS: 2000.0000 [IU] | ORAL_CAPSULE | Freq: Every day | ORAL | 3 refills | Status: DC

## 2020-11-20 MED ORDER — LEVOTHYROXINE SODIUM 112 MCG PO TABS: 112.0000 ug | ORAL_TABLET | Freq: Every day | ORAL | 3 refills | Status: DC

## 2020-11-20 MED ORDER — BUPROPION HCL 75 MG PO TABS: ORAL_TABLET | ORAL | 1 refills | Status: DC

## 2020-11-20 NOTE — Patient Instructions (Signed)
Lab Results  Component Value Date   TSH 0.02 (L) 10/09/2020   TSH 0.03 (L) 05/01/2020   TSH 0.01 (L) 01/31/2020   TSH 5.13 (H) 11/29/2019   TSH 0.01 (L) 10/18/2019   Last vitamin D Lab Results  Component Value Date   VD25OH 21 (L) 10/09/2020

## 2020-11-20 NOTE — Progress Notes (Signed)
Patient ID: Katherine Stevens, female    DOB: Aug 16, 1945, 75 y.o.   MRN: [id number]  PCP: Danelle Berry, PA-C  Chief Complaint  Patient presents with   Hypothyroidism   Depression    Subjective:   Katherine Stevens is a 75 y.o. female, presents to clinic with CC of the following:  HPI  Here for f/up on abnormal TSH and anxiety sx  Lab Results  Component Value Date   TSH 0.02 (L) 10/09/2020   After last OV meds were changed and she is here for recheck on sx.  Levothyroxine was decreased to 112 mcg daily  Celexa dose was increased to 20 mg once daily Wellbutrin dose was reduced to 75 mg qam and q noon   She does have less anxiety and depressive sx are a little better, still not sleeping well, but not using seroquel to help with sleep. Reviewed PHQ9 and GAD7, some improvement compared to 10/09/2020  GAD 7 : Generalized Anxiety Score 11/20/2020 10/09/2020 05/01/2020  Nervous, Anxious, on Edge 0 3 0  Control/stop worrying 0 2 0  Worry too much - different things 0 1 0  Trouble relaxing 0 1 0  Restless 0 0 0  Easily annoyed or irritable 0 0 0  Afraid - awful might happen 0 2 0  Total GAD 7 Score 0 9 0  Anxiety Difficulty Not difficult at all Somewhat difficult Not difficult at all   Depression screen [facility] 2/9 11/20/2020 10/09/2020 07/08/2020  Decreased Interest 1 3 0  Down, Depressed, Hopeless 1 3 0  PHQ - 2 Score 2 6 0  Altered sleeping 2 3 -  Tired, decreased energy 1 3 -  Change in appetite 0 1 -  Feeling bad or failure about yourself  0 1 -  Trouble concentrating 1 1 -  Moving slowly or fidgety/restless 0 1 -  Suicidal thoughts 0 0 -  PHQ-9 Score 6 16 -  Difficult doing work/chores Somewhat difficult Somewhat difficult -  Some recent data might be hidden      Patient Active Problem List   Diagnosis Date Noted   Insomnia due to other mental disorder 05/01/2020   OAB (overactive bladder) 05/01/2020   Osteopenia 05/01/2020   History of hepatitis C 05/01/2020    Abnormality of gait and mobility 05/01/2020   Current mild episode of major depressive disorder (HCC) 04/16/2019   Type 2 diabetes mellitus without complication, without long-term current use of insulin (HCC) 04/16/2019   Cirrhosis of liver without ascites (HCC) 04/16/2019   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/16/2019   Folate deficiency 04/16/2019   Vitamin B1 deficiency 04/16/2019   GERD (gastroesophageal reflux disease) 11/22/2018   Tobacco dependence 12/12/2015   Hyperlipidemia 12/10/2014   Osteoarthritis of both knees 08/01/2012   Seasonal allergies 07/28/2012   Class 1 obesity with serious comorbidity and body mass index (BMI) of 34.0 to 34.9 in adult 07/29/2011   Hypothyroidism 12/20/2008   Hypertension, benign 12/20/2008      Current Outpatient Medications:    atorvastatin (LIPITOR) 40 MG tablet, Take 1 tablet (40 mg total) by mouth at bedtime. For cholesterol, Disp: 90 tablet, Rfl: 3   buPROPion (WELLBUTRIN) 75 MG tablet, Take 1 tablet (75 mg total) by mouth 2 (two) times daily. Take 1 tab po with breakfast and 1 tab po with lunch -  for moods and smoking cessation, Disp: 180 tablet, Rfl: 1   citalopram (CELEXA) 20 MG tablet, Take 1 tablet (20 mg total) by  mouth in the morning., Disp: 90 tablet, Rfl: 1   donepezil (ARICEPT) 10 MG tablet, Take 1 tablet (10 mg total) by mouth at bedtime., Disp: 90 tablet, Rfl: 0   levothyroxine (SYNTHROID) 112 MCG tablet, Take 1 tablet (112 mcg total) by mouth daily before breakfast. Two days a week take with 13 mcg capsule, Disp: 90 tablet, Rfl: 3   loratadine (CLARITIN) 10 MG tablet, Take 1 tablet (10 mg total) by mouth at bedtime. For allergies, Disp: 90 tablet, Rfl: 3   losartan (COZAAR) 50 MG tablet, Take 1 tablet (50 mg total) by mouth every morning. For hypertension, Disp: 90 tablet, Rfl: 3   metFORMIN (GLUCOPHAGE) 500 MG tablet, Take 1 tablet (500 mg total) by mouth daily with breakfast. For well controlled diabetes, Disp: 90  tablet, Rfl: 3   oxybutynin (DITROPAN-XL) 5 MG 24 hr tablet, Take 1 tablet (5 mg total) by mouth daily. For overactive bladder, Disp: 90 tablet, Rfl: 3   pantoprazole (PROTONIX) 20 MG tablet, Take 1 tablet (20 mg total) by mouth at bedtime. For GERD/GI symptoms, Disp: 90 tablet, Rfl: 3   QUEtiapine (SEROQUEL) 25 MG tablet, Take 1 tablet (25 mg total) by mouth at bedtime as needed (for insomnia)., Disp: 90 tablet, Rfl: 1   Travoprost, BAK Free, (TRAVATAN) 0.004 % SOLN ophthalmic solution, 1 drop at bedtime., Disp: , Rfl:    vitamin B-12 (CYANOCOBALAMIN) 1000 MCG tablet, Take 1,000 mcg by mouth daily., Disp: , Rfl:    Allergies  Allergen Reactions   Lisinopril Anaphylaxis    MOUTH & JAW SWELLING     Social History   Tobacco Use   Smoking status: Every Day    Packs/day: 0.25    Types: Cigarettes   Smokeless tobacco: Never   Tobacco comments:    6-7 cigarettes a day; taking wellbutrin  Vaping Use   Vaping Use: Never used  Substance Use Topics   Alcohol use: Not Currently   Drug use: Not Currently      Chart Review Today: I personally reviewed active problem list, medication list, allergies, family history, social history, health maintenance, notes from last encounter, lab results, imaging with the patient/caregiver today.   Review of Systems  Constitutional: Negative.   HENT: Negative.    Eyes: Negative.   Respiratory: Negative.    Cardiovascular: Negative.   Gastrointestinal: Negative.   Endocrine: Negative.   Genitourinary: Negative.   Musculoskeletal: Negative.   Skin: Negative.   Allergic/Immunologic: Negative.   Neurological: Negative.   Hematological: Negative.   Psychiatric/Behavioral: Negative.    All other systems reviewed and are negative.     Objective:   Vitals:   11/20/20 1118  Pulse: 80  Resp: 16  Temp: 98.5 F (36.9 C)  Weight: 230 lb 1.6 oz (104.4 kg)  Height: 5\' 6"  (1.676 m)    Body mass index is 37.14 kg/m.  Physical Exam Vitals and  nursing note reviewed.  Constitutional:      General: She is not in acute distress.    Appearance: She is obese. She is not ill-appearing, toxic-appearing or diaphoretic.  HENT:     Head: Normocephalic and atraumatic.     Right Ear: External ear normal.     Left Ear: External ear normal.  Cardiovascular:     Rate and Rhythm: Normal rate and regular rhythm.     Pulses: Normal pulses.     Heart sounds: Normal heart sounds.  Pulmonary:     Effort: Pulmonary effort is normal.  Breath sounds: Normal breath sounds.  Skin:    Coloration: Skin is not jaundiced or pale.     Findings: No lesion or rash.  Neurological:     Mental Status: She is alert. Mental status is at baseline.     Gait: Gait abnormal.  Psychiatric:        Attention and Perception: She is attentive.        Mood and Affect: Mood and affect normal.        Speech: Speech normal.        Behavior: Behavior normal. Behavior is cooperative.        Thought Content: Thought content normal. Thought content does not include suicidal ideation.     Results for orders placed or performed in visit on 10/09/20  TSH  Result Value Ref Range   TSH 0.02 (L) 0.40 - 4.50 mIU/L  Hemoglobin A1C  Result Value Ref Range   Hgb A1c MFr Bld 6.1 (H) <5.7 % of total Hgb   Mean Plasma Glucose 128 mg/dL   eAG (mmol/L) 7.1 mmol/L  COMPLETE METABOLIC PANEL WITH GFR  Result Value Ref Range   Glucose, Bld 102 (H) 65 - 99 mg/dL   BUN 13 7 - 25 mg/dL   Creat 4.090.95 (H) 8.110.60 - 0.93 mg/dL   GFR, Est Non African American 59 (L) > OR = 60 mL/min/1.1373m2   GFR, Est African American 68 > OR = 60 mL/min/1.7673m2   BUN/Creatinine Ratio 14 6 - 22 (calc)   Sodium 142 135 - 146 mmol/L   Potassium 4.3 3.5 - 5.3 mmol/L   Chloride 110 98 - 110 mmol/L   CO2 24 20 - 32 mmol/L   Calcium 9.3 8.6 - 10.4 mg/dL   Total Protein 7.7 6.1 - 8.1 g/dL   Albumin 4.2 3.6 - 5.1 g/dL   Globulin 3.5 1.9 - 3.7 g/dL (calc)   AG Ratio 1.2 1.0 - 2.5 (calc)   Total Bilirubin 0.4  0.2 - 1.2 mg/dL   Alkaline phosphatase (APISO) 140 37 - 153 U/L   AST 17 10 - 35 U/L   ALT 11 6 - 29 U/L  Lipid panel  Result Value Ref Range   Cholesterol 113 <200 mg/dL   HDL 59 > OR = 50 mg/dL   Triglycerides 76 <914<150 mg/dL   LDL Cholesterol (Calc) 38 mg/dL (calc)   Total CHOL/HDL Ratio 1.9 <5.0 (calc)   Non-HDL Cholesterol (Calc) 54 <782<130 mg/dL (calc)  VITAMIN D 25 Hydroxy (Vit-D Deficiency, Fractures)  Result Value Ref Range   Vit D, 25-Hydroxy 21 (L) 30 - 100 ng/mL  Vitamin B12  Result Value Ref Range   Vitamin B-12 507 200 - 1,100 pg/mL  CBC with Differential/Platelet  Result Value Ref Range   WBC 9.0 3.8 - 10.8 Thousand/uL   RBC 4.17 3.80 - 5.10 Million/uL   Hemoglobin 12.4 11.7 - 15.5 g/dL   HCT 95.637.6 21.335.0 - 08.645.0 %   MCV 90.2 80.0 - 100.0 fL   MCH 29.7 27.0 - 33.0 pg   MCHC 33.0 32.0 - 36.0 g/dL   RDW 57.812.8 46.911.0 - 62.915.0 %   Platelets 273 140 - 400 Thousand/uL   MPV 9.7 7.5 - 12.5 fL   Neutro Abs 5,049 1,500 - 7,800 cells/uL   Lymphs Abs 3,033 850 - 3,900 cells/uL   Absolute Monocytes 684 200 - 950 cells/uL   Eosinophils Absolute 198 15 - 500 cells/uL   Basophils Absolute 36 0 - 200 cells/uL   Neutrophils  Relative % 56.1 %   Total Lymphocyte 33.7 %   Monocytes Relative 7.6 %   Eosinophils Relative 2.2 %   Basophils Relative 0.4 %       Assessment & Plan:     ICD-10-CM   1. Hypothyroidism, unspecified type  E03.9 TSH    levothyroxine (SYNTHROID) 112 MCG tablet   meds reviewed, here for recheck of TSH - reviewed tsh labs and med dosing with pt for over the past year, seems to be feeling better    2. Current mild episode of major depressive disorder, unspecified whether recurrent (HCC)  F32.0 TSH    buPROPion (WELLBUTRIN) 75 MG tablet   phq reviewed, positive, but improved since last OV, continue celexa 20 mg    3. Insomnia due to other mental disorder  F51.05 TSH   F99    encouraged sleep hygiene, keep wellbutrin dose low and do not take after early afternoon     4. Vitamin D deficiency  E55.9 Cholecalciferol (VITAMIN D3) 50 MCG (2000 UT) capsule   reviewed past labs, sent in daily supplement 1000-2000 IU daily to help improve vit D    5. Anxiety  F41.9 TSH    buPROPion (WELLBUTRIN) 75 MG tablet   anxiety sx improved, continue lower levothyroxine dose and increased celexa dose - med list and Rx details and dosing updated and reviewed with pt      Return for return around Jan 7th for DM HTN GERD Thyroid f/up.    Danelle Berry, PA-C 11/20/20 11:34 AM

## 2020-12-01 ENCOUNTER — Telehealth: Payer: Self-pay

## 2020-12-01 NOTE — Telephone Encounter (Signed)
Spoke with patient and reminded her to schedule her bone density exam. She stated she did not like how long it takes but would consider. Number for scheduling was provided.

## 2020-12-07 ENCOUNTER — Other Ambulatory Visit: Payer: Self-pay | Admitting: Family Medicine

## 2020-12-09 ENCOUNTER — Ambulatory Visit
Admission: RE | Admit: 2020-12-09 | Discharge: 2020-12-09 | Disposition: A | Discharge status: Home / Self Care | Payer: Medicare Other | Source: Ambulatory Visit | Attending: Family Medicine | Admitting: Family Medicine

## 2020-12-09 ENCOUNTER — Other Ambulatory Visit: Payer: Self-pay

## 2020-12-09 DIAGNOSIS — Z1231 Encounter for screening mammogram for malignant neoplasm of breast: Secondary | ICD-10-CM | POA: Insufficient documentation

## 2020-12-11 ENCOUNTER — Inpatient Hospital Stay
Admission: RE | Admit: 2020-12-11 | Discharge: 2020-12-11 | Disposition: A | Discharge status: Home / Self Care | Payer: Self-pay | Source: Ambulatory Visit | Attending: *Deleted | Admitting: *Deleted

## 2020-12-11 ENCOUNTER — Other Ambulatory Visit: Payer: Self-pay | Admitting: *Deleted

## 2020-12-11 DIAGNOSIS — Z1231 Encounter for screening mammogram for malignant neoplasm of breast: Secondary | ICD-10-CM

## 2020-12-12 ENCOUNTER — Telehealth: Payer: Self-pay | Admitting: Family Medicine

## 2020-12-12 NOTE — Telephone Encounter (Signed)
Pt called stating that she missed a call. Pt wanted to know where her mammogram results back yet? Please advise [phone]

## 2020-12-12 NOTE — Telephone Encounter (Signed)
Pt was notified.  

## 2021-01-14 ENCOUNTER — Other Ambulatory Visit: Payer: Self-pay | Admitting: Family Medicine

## 2021-01-14 DIAGNOSIS — F0280 Dementia in other diseases classified elsewhere without behavioral disturbance: Secondary | ICD-10-CM

## 2021-01-14 DIAGNOSIS — G309 Alzheimer's disease, unspecified: Secondary | ICD-10-CM

## 2021-03-18 LAB — HM DIABETES EYE EXAM

## 2021-04-13 ENCOUNTER — Ambulatory Visit: Payer: Medicare Other | Admitting: Physician Assistant

## 2021-04-20 ENCOUNTER — Ambulatory Visit (INDEPENDENT_AMBULATORY_CARE_PROVIDER_SITE_OTHER): Payer: Medicare Other | Admitting: Physician Assistant

## 2021-04-20 ENCOUNTER — Encounter: Payer: Self-pay | Admitting: Physician Assistant

## 2021-04-20 VITALS — BP 132/70 | HR 93 | Temp 98.5°F | Resp 16 | Ht 66.0 in | Wt 229.7 lb

## 2021-04-20 DIAGNOSIS — G309 Alzheimer's disease, unspecified: Secondary | ICD-10-CM

## 2021-04-20 DIAGNOSIS — I10 Essential (primary) hypertension: Secondary | ICD-10-CM

## 2021-04-20 DIAGNOSIS — F320 Major depressive disorder, single episode, mild: Secondary | ICD-10-CM | POA: Diagnosis not present

## 2021-04-20 DIAGNOSIS — F0150 Vascular dementia without behavioral disturbance: Secondary | ICD-10-CM

## 2021-04-20 DIAGNOSIS — E039 Hypothyroidism, unspecified: Secondary | ICD-10-CM

## 2021-04-20 DIAGNOSIS — E119 Type 2 diabetes mellitus without complications: Secondary | ICD-10-CM

## 2021-04-20 DIAGNOSIS — F419 Anxiety disorder, unspecified: Secondary | ICD-10-CM

## 2021-04-20 DIAGNOSIS — F0280 Dementia in other diseases classified elsewhere without behavioral disturbance: Secondary | ICD-10-CM

## 2021-04-20 DIAGNOSIS — K219 Gastro-esophageal reflux disease without esophagitis: Secondary | ICD-10-CM

## 2021-04-20 LAB — POCT GLYCOSYLATED HEMOGLOBIN (HGB A1C): Hemoglobin A1C: 6.3 % — AB (ref 4.0–5.6)

## 2021-04-20 MED ORDER — BUPROPION HCL 75 MG PO TABS: ORAL_TABLET | ORAL | 1 refills | Status: DC

## 2021-04-20 MED ORDER — LOSARTAN POTASSIUM 50 MG PO TABS: 50.0000 mg | ORAL_TABLET | ORAL | 3 refills | Status: DC

## 2021-04-20 MED ORDER — DONEPEZIL HCL 10 MG PO TABS: 10.0000 mg | ORAL_TABLET | Freq: Every day | ORAL | 1 refills | Status: DC

## 2021-04-20 NOTE — Patient Instructions (Addendum)
It was nice to meet you and I appreciate the opportunity to be involved in your care   I am checking your A1c today to make sure your blood sugars are staying in range I have sent in refills for your Bupropion, Losartan and Donepezil- please continue to take them as directed If you have questions about your medications please give [place] or your pharmacy a phone call.   Our Care Management Pharmacist will be giving you a call to help coordinate your prescriptions and provide further management assistance.

## 2021-04-20 NOTE — Assessment & Plan Note (Signed)
Chronic and appears well managed at this time with Losartan Patient unable to provide information regarding home blood pressure readings  Recommend continue with current regimen

## 2021-04-20 NOTE — Progress Notes (Signed)
Established Patient Office Visit  Subjective:  Patient ID: Katherine Stevens, female    DOB: 06/12/1945  Age: 76 y.o. MRN: [id number]  Introduced myself to the patient as a PA-C and provided education on APPs in clinical practice.    CC:  Chief Complaint  Patient presents with   Follow-up   Diabetes   Hypertension   Gastroesophageal Reflux   Hypothyroidism    HPI Katherine Stevens presents for follow up for Hypertension, hypothyroidism, GERD, T2DM  Hypertension: States her BP was 139  but was not able to provide further answers.  GERD: states she is taking her Pantoprazole at night and denies current symptoms  T2DM: Patient states she is not able to remember what her most recent blood glucose readings were.   Hypothyroidism: Reports compliance with medication regimen. Denies changes to skin, nails or hair,  Reports increased anxiety over the last few weeks   Reports anxiety and "nerves" at night - not taking Seroquel regularly at night Recently ran out of Bupropion and is not sure if she is taking Citalopram  Patient states she lives alone but family lives nearby and checks on her regularly.  Reports she is driving herself now.  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   Depression    Diabetes mellitus without complication (HCC)    Hyperlipidemia    Hypertension    Memory loss    Overactive bladder    Thyroid disease    Vertigo 04/16/2019    Past Surgical History:  Procedure Laterality Date   ABDOMINAL HYSTERECTOMY      Family History  Problem Relation Age of Onset   Heart failure Mother    Diabetes Mother    Diabetes Sister    Hyperlipidemia Sister    Thyroid disease Sister    Stroke Brother    Heart attack Brother    Cancer Maternal Grandmother    Heart attack Brother     Social History    Socioeconomic History   Marital status: Divorced    Spouse name: Not on file   Number of children: 2   Years of education: Not on file   Highest education level: GED or equivalent  Occupational History   Not on file  Tobacco Use   Smoking status: Every Day    Packs/day: 0.25    Types: Cigarettes   Smokeless tobacco: Never   Tobacco comments:    6-7 cigarettes a day; taking wellbutrin  Vaping Use   Vaping Use: Never used  Substance and Sexual Activity   Alcohol use: Not Currently   Drug use: Not Currently   Sexual activity: Not Currently  Other Topics Concern   Not on file  Social History Narrative   Pt lives alone   Social Determinants of Health   Financial Resource Strain: Low Risk    Difficulty of Paying Living Expenses: Not hard at all  Food Insecurity: No Food Insecurity   Worried About Programme researcher, broadcasting/film/video in the Last Year: Never true   Ran Out of Food in the Last Year: Never true  Transportation Needs: No Transportation Needs   Lack of Transportation (Medical): No   Lack of Transportation (Non-Medical): No  Physical Activity: Inactive   Days of Exercise per Week: 0 days   Minutes of Exercise per Session:  0 min  Stress: No Stress Concern Present   Feeling of Stress : Not at all  Social Connections: Socially Isolated   Frequency of Communication with Friends and Family: More than three times a week   Frequency of Social Gatherings with Friends and Family: Three times a week   Attends Religious Services: Never   Active Member of Clubs or Organizations: No   Attends Archivist Meetings: Never   Marital Status: Divorced  Human resources officer Violence: Not At Risk   Fear of Current or Ex-Partner: No   Emotionally Abused: No   Physically Abused: No   Sexually Abused: No    Outpatient Medications Prior to Visit  Medication Sig Dispense Refill   atorvastatin (LIPITOR) 40 MG tablet Take 1 tablet (40 mg total) by mouth at bedtime. For cholesterol 90 tablet  3   Cholecalciferol (VITAMIN D3) 50 MCG (2000 UT) capsule Take 1 capsule (2,000 Units total) by mouth daily. 90 capsule 3   levothyroxine (SYNTHROID) 112 MCG tablet Take 1 tablet (112 mcg total) by mouth daily before breakfast. 90 tablet 3   loratadine (CLARITIN) 10 MG tablet Take 1 tablet (10 mg total) by mouth at bedtime. For allergies 90 tablet 3   metFORMIN (GLUCOPHAGE) 500 MG tablet Take 1 tablet (500 mg total) by mouth daily with breakfast. For well controlled diabetes 90 tablet 3   oxybutynin (DITROPAN-XL) 5 MG 24 hr tablet Take 1 tablet (5 mg total) by mouth daily. For overactive bladder 90 tablet 3   pantoprazole (PROTONIX) 20 MG tablet Take 1 tablet (20 mg total) by mouth at bedtime. For GERD/GI symptoms 90 tablet 3   QUEtiapine (SEROQUEL) 25 MG tablet Take 1 tablet (25 mg total) by mouth at bedtime as needed (for insomnia). 90 tablet 1   Travoprost, BAK Free, (TRAVATAN) 0.004 % SOLN ophthalmic solution 1 drop at bedtime.     buPROPion (WELLBUTRIN) 75 MG tablet Take 1 (75 mg) tab po with breakfast and 1 tab po with lunch -  for moods and smoking cessation 180 tablet 1   citalopram (CELEXA) 20 MG tablet Take 1 tablet (20 mg total) by mouth in the morning. 90 tablet 1   donepezil (ARICEPT) 10 MG tablet TAKE 1 TABLET(10 MG) BY MOUTH AT BEDTIME 90 tablet 1   losartan (COZAAR) 50 MG tablet Take 1 tablet (50 mg total) by mouth every morning. For hypertension 90 tablet 3   No facility-administered medications prior to visit.    Allergies  Allergen Reactions   Lisinopril Anaphylaxis    MOUTH & JAW SWELLING    ROS Review of Systems  Constitutional:  Positive for fatigue (intermittent fatigue). Negative for chills and fever.  HENT:  Negative for trouble swallowing.   Eyes:  Negative for visual disturbance.  Cardiovascular:  Negative for chest pain, palpitations and leg swelling.  Gastrointestinal:  Negative for constipation, diarrhea, nausea and vomiting.  Endocrine: Negative for cold  intolerance and heat intolerance.  Skin:  Negative for color change and rash.  Neurological:  Negative for dizziness, weakness, numbness and headaches.  Psychiatric/Behavioral:  The patient is nervous/anxious.      Objective:    Physical Exam Constitutional:      Appearance: Normal appearance. She is obese.  Cardiovascular:     Rate and Rhythm: Normal rate and regular rhythm.     Pulses: Normal pulses.     Heart sounds: Normal heart sounds.  Pulmonary:     Effort: Pulmonary effort is normal.  Breath sounds: Normal breath sounds. No decreased breath sounds, wheezing, rhonchi or rales.  Musculoskeletal:     Cervical back: Normal range of motion and neck supple.  Neurological:     Mental Status: She is alert.  Psychiatric:        Attention and Perception: Attention normal.        Mood and Affect: Mood normal.        Speech: Speech normal.        Behavior: Behavior normal. Behavior is cooperative.        Cognition and Memory: Cognition is impaired. Memory is impaired.        Judgment: Judgment normal.    BP 132/70    Pulse 93    Temp 98.5 F (36.9 C) (Oral)    Resp 16    Ht 5\' 6"  (1.676 m)    Wt 229 lb 11.2 oz (104.2 kg)    SpO2 97%    BMI 37.07 kg/m  Wt Readings from Last 3 Encounters:  04/20/21 229 lb 11.2 oz (104.2 kg)  11/20/20 230 lb 1.6 oz (104.4 kg)  10/09/20 231 lb 3.2 oz (104.9 kg)     Health Maintenance Due  Topic Date Due   Zoster Vaccines- Shingrix (1 of 2) Never done   DEXA SCAN  12/18/2017    There are no preventive care reminders to display for this patient.  Lab Results  Component Value Date   TSH 0.18 (L) 11/20/2020   Lab Results  Component Value Date   WBC 9.0 10/09/2020   HGB 12.4 10/09/2020   HCT 37.6 10/09/2020   MCV 90.2 10/09/2020   PLT 273 10/09/2020   Lab Results  Component Value Date   NA 142 10/09/2020   K 4.3 10/09/2020   CO2 24 10/09/2020   GLUCOSE 102 (H) 10/09/2020   BUN 13 10/09/2020   CREATININE 0.95 (H) 10/09/2020    BILITOT 0.4 10/09/2020   AST 17 10/09/2020   ALT 11 10/09/2020   PROT 7.7 10/09/2020   CALCIUM 9.3 10/09/2020   ANIONGAP 12 04/20/2019   Lab Results  Component Value Date   CHOL 113 10/09/2020   Lab Results  Component Value Date   HDL 59 10/09/2020   Lab Results  Component Value Date   LDLCALC 38 10/09/2020   Lab Results  Component Value Date   TRIG 76 10/09/2020   Lab Results  Component Value Date   CHOLHDL 1.9 10/09/2020   Lab Results  Component Value Date   HGBA1C 6.3 (A) 04/20/2021      Assessment & Plan:   Problem List Items Addressed This Visit       Cardiovascular and Mediastinum   Mixed Alzheimer's and vascular dementia ([place])    Appears chronic based on chart and active given interaction with patient She is currently taking Donepezil for cognition enhancement - denies concerns for side effects Recommend that patient be enrolled in chronic care management with social worker Attempted to enroll her in this today but she refused Education officer, museum component Recommend MMSE at next apt for monitoring and comparison Recommend patient be accompanied by designated party to assist with healthcare decision making and medication management      Relevant Medications   buPROPion (WELLBUTRIN) 75 MG tablet   donepezil (ARICEPT) 10 MG tablet   losartan (COZAAR) 50 MG tablet   Other Relevant Orders   AMB Referral to [place] Coordinaton   Hypertension, benign    Chronic and appears well managed at  this time with Losartan Patient unable to provide information regarding home blood pressure readings  Recommend continue with current regimen       Relevant Medications   losartan (COZAAR) 50 MG tablet     Digestive   GERD (gastroesophageal reflux disease)    Chronic and well managed, per patient, with Pantoprazole Continue current regimen         Endocrine   Type 2 diabetes mellitus without complication, without long-term current use of insulin (HCC) -  Primary    Chronic currently managed with Metformin  POCT A1c ordered today for monitoring as patient does not want to complete labs until next apt.  POCT A1c result was 6.3 %  Continue current medications  Recommend A1c goal of <8% appears for this patient given her age and cognitive decline with dementia      Relevant Medications   losartan (COZAAR) 50 MG tablet   Other Relevant Orders   POCT HgB A1C (Completed)   AMB Referral to [facility]   Hypothyroidism    Chronic and currently managed with Levothyroxine  She denies changes to skin, hair ,nails or hypertension but reports increased anxiety at night Unsure if this is related to thyroid, dementia, or recent lack of Wellbutrin  Attempted to schedule labs today for monitoring but patient declined until next apt  Recommend TSH at next apt for monitoring  Continue current medications for now.         Other   Current mild episode of major depressive disorder (HCC)   Relevant Medications   buPROPion (WELLBUTRIN) 75 MG tablet   Other Relevant Orders   AMB Referral to [facility] Coordin[facility]   Anxiety   Relevant Medications   buPROPion (WELLBUTRIN) 75 MG tablet   Other Relevant Orders   AMB Referral to [place] ordered this encounter  Medications   buPROPion (WELLBUTRIN) 75 MG tablet    Sig: Take 1 (75 mg) tab po with breakfast and 1 tab po with lunch -  for moods and smoking cessation    Dispense:  180 tablet    Refill:  1   donepezil (ARICEPT) 10 MG tablet    Sig: Take 1 tablet (10 mg total) by mouth at bedtime.    Dispense:  90 tablet    Refill:  1   losartan (COZAAR) 50 MG tablet    Sig: Take 1 tablet (50 mg total) by mouth every morning. For hypertension    Dispense:  90 tablet    Refill:  3    The entirety of the information documented in the History of Present Illness, Review of Systems and Physical Exam were personally obtained by me. Portions of this information were  initially documented by the CMA and reviewed by me for thoroughness and accuracy.   Mirakle Tomlin E Kahla Risdon, PA-C   Follow-up: No follow-ups on file.    Doralee Kocak E Bjorn Hallas, PA-C

## 2021-04-20 NOTE — Assessment & Plan Note (Signed)
Chronic and currently managed with Levothyroxine  She denies changes to skin, hair ,nails or hypertension but reports increased anxiety at night Unsure if this is related to thyroid, dementia, or recent lack of Wellbutrin  Attempted to schedule labs today for monitoring but patient declined until next apt  Recommend TSH at next apt for monitoring  Continue current medications for now.

## 2021-04-20 NOTE — Assessment & Plan Note (Signed)
Appears chronic based on chart and active given interaction with patient She is currently taking Donepezil for cognition enhancement - denies concerns for side effects Recommend that patient be enrolled in chronic care management with social worker Attempted to enroll her in this today but she refused social worker component Recommend MMSE at next apt for monitoring and comparison Recommend patient be accompanied by designated party to assist with healthcare decision making and medication management

## 2021-04-20 NOTE — Assessment & Plan Note (Addendum)
Chronic currently managed with Metformin  POCT A1c ordered today for monitoring as patient does not want to complete labs until next apt.  POCT A1c result was 6.3 %  Continue current medications  Recommend A1c goal of <8% appears for this patient given her age and cognitive decline with dementia

## 2021-04-20 NOTE — Assessment & Plan Note (Signed)
Chronic and well managed, per patient, with Pantoprazole Continue current regimen

## 2021-04-28 ENCOUNTER — Other Ambulatory Visit: Payer: Self-pay | Admitting: Family Medicine

## 2021-04-28 DIAGNOSIS — F320 Major depressive disorder, single episode, mild: Secondary | ICD-10-CM

## 2021-04-30 ENCOUNTER — Other Ambulatory Visit: Payer: Self-pay | Admitting: Family Medicine

## 2021-04-30 DIAGNOSIS — F320 Major depressive disorder, single episode, mild: Secondary | ICD-10-CM

## 2021-04-30 DIAGNOSIS — J302 Other seasonal allergic rhinitis: Secondary | ICD-10-CM

## 2021-04-30 DIAGNOSIS — K219 Gastro-esophageal reflux disease without esophagitis: Secondary | ICD-10-CM

## 2021-04-30 DIAGNOSIS — F5105 Insomnia due to other mental disorder: Secondary | ICD-10-CM

## 2021-04-30 DIAGNOSIS — F99 Mental disorder, not otherwise specified: Secondary | ICD-10-CM

## 2021-04-30 DIAGNOSIS — N3281 Overactive bladder: Secondary | ICD-10-CM

## 2021-04-30 NOTE — Telephone Encounter (Signed)
Requested medication (s) are due for refill today:   Yes for all 5.  Seroquel provider to review since non delegated  Requested medication (s) are on the active medication list:   Yes all 5  Future visit scheduled:   Yes 07/09/2021 with Denny Peon Mecum, PA   Seen last week   Last ordered: All 05/01/2020 #90, 3 refills except Seroquel 10/10/2020 #90, 1 refill    Celexa discontinued 10/10/2020 so it's not on the med list though a request for a refill was submitted.  Requested Prescriptions  Pending Prescriptions Disp Refills   pantoprazole (PROTONIX) 20 MG tablet [Pharmacy Med Name: PANTOPRAZOLE 20MG  TABLETS] 90 tablet 3    Sig: TAKE 1 TABLET(20 MG) BY MOUTH AT BEDTIME FOR GERD OR ABDOMINAL SYMPTOMS     Gastroenterology: Proton Pump Inhibitors Passed - 04/30/2021  7:13 AM      Passed - Valid encounter within last 12 months    Recent Outpatient Visits           1 week ago Type 2 diabetes mellitus without complication, without long-term current use of insulin (HCC)   [facility] Mecum, Erin E, PA-C   5 months ago Hypothyroidism, unspecified type   [facility] [place], [place], PA-C   6 months ago Hypothyroidism, unspecified type   [facility], PA-C   12 months ago Hypothyroidism, unspecified type   [facility], PA-C   1 year ago Hypothyroidism, unspecified type   [facility], PA-C       Future Appointments             In 2 months  [facility]   In 2 months Mecum, [facility], PA-C [facility]             oxybutynin (DITROPAN-XL) 5 MG 24 hr tablet [Pharmacy Med Name: OXYBUTYNIN ER 5MG  TABLETS] 90 tablet 3    Sig: TAKE 1 TABLET(5 MG) BY MOUTH DAILY FOR OVERACTIVE BLADDER     Urology:  Bladder Agents Passed - 04/30/2021  7:13 AM      Passed - Valid encounter within last 12 months    Recent Outpatient Visits            1 week ago Type 2 diabetes mellitus without complication, without long-term current use of insulin (HCC)   [facility] Mecum, Erin E, PA-C   5 months ago Hypothyroidism, unspecified type   [facility] [place], [place], PA-C   6 months ago Hypothyroidism, unspecified type   [facility] Guerra, [facility], PA-C   12 months ago Hypothyroidism, unspecified type   [facility], PA-C   1 year ago Hypothyroidism, unspecified type   [facility], PA-C       Future Appointments             In 2 months  [facility]   In 2 months Mecum, Danelle Berry, PA-C [facility], [facility]             citalopram (CELEXA) 10 MG tablet [Pharmacy Med Name: CITALOPRAM 10MG  TABLETS] 90 tablet 3    Sig: TAKE 1 TABLET(10 MG) BY MOUTH IN THE MORNING     Psychiatry:  Antidepressants - SSRI Passed - 04/30/2021  7:13 AM      Passed - Completed PHQ-2 or PHQ-9  in the last 360 days      Passed - Valid encounter within last 6 months    Recent Outpatient Visits           1 week ago Type 2 diabetes mellitus without complication, without long-term current use of insulin (HCC)   [facility] Mecum, Erin E, PA-C   5 months ago Hypothyroidism, unspecified type   [facility], Sheliah Mends, PA-C   6 months ago Hypothyroidism, unspecified type   [facility] Danelle Berry, PA-C   12 months ago Hypothyroidism, unspecified type   [facility] Danelle Berry, PA-C   1 year ago Hypothyroidism, unspecified type   [facility] Danelle Berry, PA-C       Future Appointments             In 2 months  [facility]   In 2 months Mecum, Oswaldo Conroy, PA-C [facility], [facility]             loratadine (CLARITIN) 10 MG tablet [Pharmacy Med  Name: LORATADINE 10MG  TABLETS] 90 tablet 3    Sig: TAKE 1 TABLET(10 MG) BY MOUTH AT BEDTIME FOR ALLERGIES     Ear, Nose, and Throat:  Antihistamines Passed - 04/30/2021  7:13 AM      Passed - Valid encounter within last 12 months    Recent Outpatient Visits           1 week ago Type 2 diabetes mellitus without complication, without long-term current use of insulin (HCC)   [facility] Mecum, Erin E, PA-C   5 months ago Hypothyroidism, unspecified type   [facility] [place], [place], PA-C   6 months ago Hypothyroidism, unspecified type   [facility], PA-C   12 months ago Hypothyroidism, unspecified type   [facility], PA-C   1 year ago Hypothyroidism, unspecified type   [facility], PA-C       Future Appointments             In 2 months  [facility]   In 2 months Mecum, [facility], PA-C [facility], [facility]             QUEtiapine (SEROQUEL) 25 MG tablet [Pharmacy Med Name: QUETIAPINE 25MG  TABLETS] 90 tablet 1    Sig: TAKE 1 TABLET(25 MG) BY MOUTH AT BEDTIME AS NEEDED FOR INSOMNIA     Not Delegated - Psychiatry:  Antipsychotics - Second Generation (Atypical) - quetiapine Failed - 04/30/2021  7:13 AM      Failed - This refill cannot be delegated      Failed - ALT in normal range and within 180 days    ALT  Date Value Ref Range Status  10/09/2020 11 6 - 29 U/L Final          Failed - AST in normal range and within 180 days    AST  Date Value Ref Range Status  10/09/2020 17 10 - 35 U/L Final          Passed - Completed PHQ-2 or PHQ-9 in the last 360 days      Passed - Last BP in normal range    BP Readings from Last 1 Encounters:  04/20/21 132/70          Passed - Valid encounter within last 6  months    Recent Outpatient Visits           1 week ago Type 2 diabetes mellitus without  complication, without long-term current use of insulin (HCC)   [facility] Mecum, Erin E, PA-C   5 months ago Hypothyroidism, unspecified type   [facility] [place], Sheliah MendsLeisa, PA-C   6 months ago Hypothyroidism, unspecified type   [facility] Danelle Berryapia, Leisa, PA-C   12 months ago Hypothyroidism, unspecified type   [facility] Danelle Berryapia, Leisa, PA-C   1 year ago Hypothyroidism, unspecified type   [facility] Danelle Berryapia, Leisa, PA-C       Future Appointments             In 2 months  [facility]   In 2 months Mecum, Oswaldo ConroyErin E, PA-C [facility]

## 2021-05-01 ENCOUNTER — Other Ambulatory Visit: Payer: Self-pay

## 2021-05-01 DIAGNOSIS — E785 Hyperlipidemia, unspecified: Secondary | ICD-10-CM

## 2021-05-01 DIAGNOSIS — E119 Type 2 diabetes mellitus without complications: Secondary | ICD-10-CM

## 2021-05-01 MED ORDER — METFORMIN HCL 500 MG PO TABS: 500.0000 mg | ORAL_TABLET | Freq: Every day | ORAL | 3 refills | Status: DC

## 2021-05-01 MED ORDER — ATORVASTATIN CALCIUM 40 MG PO TABS: 40.0000 mg | ORAL_TABLET | Freq: Every day | ORAL | 3 refills | Status: DC

## 2021-07-09 ENCOUNTER — Ambulatory Visit (INDEPENDENT_AMBULATORY_CARE_PROVIDER_SITE_OTHER): Payer: Medicare Other

## 2021-07-09 DIAGNOSIS — Z780 Asymptomatic menopausal state: Secondary | ICD-10-CM | POA: Diagnosis not present

## 2021-07-09 DIAGNOSIS — Z0000 Encounter for general adult medical examination without abnormal findings: Secondary | ICD-10-CM

## 2021-07-09 NOTE — Progress Notes (Signed)
? ?Subjective:  ? Katherine Stevens is a 76 y.o. female who presents for Medicare Annual (Subsequent) preventive examination. ? ?Virtual Visit via Telephone Note ? ?I connected with  Katherine Stevens on 07/09/21 at  9:30 AM EDT by telephone and verified that I am speaking with the correct person using two identifiers. ? ?Location: ?Patient: home ?Provider: [facility] ?Persons participating in the virtual visit: patient/Nurse Health Advisor ?  ?I discussed the limitations, risks, security and privacy concerns of performing an evaluation and management service by telephone and the availability of in person appointments. The patient expressed understanding and agreed to proceed. ? ?Interactive audio and video telecommunications were attempted between this nurse and patient, however failed, due to patient having technical difficulties OR patient did not have access to video capability.  We continued and completed visit with audio only. ? ?Some vital signs may be absent or patient reported.  ? ?Reather LittlerKasey Chesney Klimaszewski, LPN ? ? ?Review of Systems    ? ?Cardiac Risk Factors include: advanced age (>7755men, 9>65 women);diabetes mellitus;dyslipidemia;hypertension;sedentary lifestyle;smoking/ tobacco exposure;obesity (BMI >30kg/m2) ? ?   ?Objective:  ?  ?There were no vitals filed for this visit. ?There is no height or weight on file to calculate BMI. ? ? ?  07/09/2021  ?  9:50 AM 07/08/2020  ?  9:31 AM 07/03/2019  ?  3:18 PM 03/31/2019  ? 11:54 AM  ?Advanced Directives  ?Does Patient Have a Medical Advance Directive? No No No No  ?Would patient like information on creating a medical advance directive? Yes (MAU/Ambulatory/Procedural Areas - Information given) No - Patient declined No - Patient declined   ? ? ?Current Medications (verified) ?Outpatient Encounter Medications as of 07/09/2021  ?Medication Sig  ? atorvastatin (LIPITOR) 40 MG tablet Take 1 tablet (40 mg total) by mouth at bedtime. For cholesterol  ? Cholecalciferol (VITAMIN D3) 50 MCG  (2000 UT) capsule Take 1 capsule (2,000 Units total) by mouth daily.  ? citalopram (CELEXA) 10 MG tablet TAKE 1 TABLET(10 MG) BY MOUTH IN THE MORNING  ? donepezil (ARICEPT) 10 MG tablet Take 1 tablet (10 mg total) by mouth at bedtime.  ? levothyroxine (SYNTHROID) 112 MCG tablet Take 1 tablet (112 mcg total) by mouth daily before breakfast.  ? loratadine (CLARITIN) 10 MG tablet TAKE 1 TABLET(10 MG) BY MOUTH AT BEDTIME FOR ALLERGIES  ? losartan (COZAAR) 50 MG tablet Take 1 tablet (50 mg total) by mouth every morning. For hypertension  ? metFORMIN (GLUCOPHAGE) 500 MG tablet Take 1 tablet (500 mg total) by mouth daily with breakfast. For well controlled diabetes  ? oxybutynin (DITROPAN-XL) 5 MG 24 hr tablet TAKE 1 TABLET(5 MG) BY MOUTH DAILY FOR OVERACTIVE BLADDER  ? pantoprazole (PROTONIX) 20 MG tablet TAKE 1 TABLET(20 MG) BY MOUTH AT BEDTIME FOR GERD OR ABDOMINAL SYMPTOMS  ? QUEtiapine (SEROQUEL) 25 MG tablet TAKE 1 TABLET(25 MG) BY MOUTH AT BEDTIME AS NEEDED FOR INSOMNIA  ? buPROPion (WELLBUTRIN) 75 MG tablet Take 1 (75 mg) tab po with breakfast and 1 tab po with lunch -  for moods and smoking cessation (Patient not taking: Reported on 07/09/2021)  ? Travoprost, BAK Free, (TRAVATAN) 0.004 % SOLN ophthalmic solution 1 drop at bedtime. (Patient not taking: Reported on 07/09/2021)  ? ?No facility-administered encounter medications on file as of 07/09/2021.  ? ? ?Allergies (verified) ?Lisinopril  ? ?History: ?Past Medical History:  ?Diagnosis Date  ? B12 deficiency   ? 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  Depression   ? Diabetes mellitus without complication (HCC)   ? Hyperlipidemia   ? Hypertension   ? Memory loss   ? Overactive bladder   ? Thyroid disease   ? Vertigo 04/16/2019  ? ?Past Surgical History:  ?Procedure Laterality Date  ? ABDOMINAL HYSTERECTOMY    ? ?Family History  ?Problem Relation Age of Onset  ? Heart failure Mother   ? Diabetes Mother   ? Diabetes Sister   ?  Hyperlipidemia Sister   ? Thyroid disease Sister   ? Stroke Brother   ? Heart attack Brother   ? Cancer Maternal Grandmother   ? Heart attack Brother   ? ?Social History  ? ?Socioeconomic History  ? Marital status: Divorced  ?  Spouse name: Not on file  ? Number of children: 2  ? Years of education: Not on file  ? Highest education level: GED or equivalent  ?Occupational History  ? Not on file  ?Tobacco Use  ? Smoking status: Every Day  ?  Packs/day: 0.25  ?  Types: Cigarettes  ? Smokeless tobacco: Never  ? Tobacco comments:  ?  6-7 cigarettes a day; taking wellbutrin  ?Vaping Use  ? Vaping Use: Never used  ?Substance and Sexual Activity  ? Alcohol use: Not Currently  ? Drug use: Not Currently  ? Sexual activity: Not Currently  ?Other Topics Concern  ? Not on file  ?Social History Narrative  ? Pt lives alone  ? ?Social Determinants of Health  ? ?Financial Resource Strain: Low Risk   ? Difficulty of Paying Living Expenses: Not hard at all  ?Food Insecurity: No Food Insecurity  ? Worried About Programme researcher, broadcasting/film/video in the Last Year: Never true  ? Ran Out of Food in the Last Year: Never true  ?Transportation Needs: No Transportation Needs  ? Lack of Transportation (Medical): No  ? Lack of Transportation (Non-Medical): No  ?Physical Activity: Inactive  ? Days of Exercise per Week: 0 days  ? Minutes of Exercise per Session: 0 min  ?Stress: No Stress Concern Present  ? Feeling of Stress : Not at all  ?Social Connections: Socially Isolated  ? Frequency of Communication with Friends and Family: More than three times a week  ? Frequency of Social Gatherings with Friends and Family: Three times a week  ? Attends Religious Services: Never  ? Active Member of Clubs or Organizations: No  ? Attends Banker Meetings: Never  ? Marital Status: Divorced  ? ? ?Tobacco Counseling ?Ready to quit: Not Answered ?Counseling given: Not Answered ?Tobacco comments: 6-7 cigarettes a day; taking wellbutrin ? ? ?Clinical  Intake: ? ?Pre-visit preparation completed: Yes ? ?Pain : No/denies pain ? ?  ? ?Nutritional Risks: None ?Diabetes: Yes ?CBG done?: No ?Did pt. bring in CBG monitor from home?: No ? ?How often do you need to have someone help you when you read instructions, pamphlets, or other written materials from your doctor or pharmacy?: 1 - Never ? ?Nutrition Risk Assessment: ? ?Has the patient had any N/V/D within the last 2 months?  No  ?Does the patient have any non-healing wounds?  No  ?Has the patient had any unintentional weight loss or weight gain?  No  ? ?Diabetes: ? ?Is the patient diabetic?  Yes  ?If diabetic, was a CBG obtained today?  No  ?Did the patient bring in their glucometer from home?  No  ?How often do you monitor your CBG's? Twice weekly.  ? ?Financial  Strains and Diabetes Management: ? ?Are you having any financial strains with the device, your supplies or your medication? No .  ?Does the patient want to be seen by Chronic Care Management for management of their diabetes?  No  ?Would the patient like to be referred to a Nutritionist or for Diabetic Management?  No  ? ?Diabetic Exams: ? ?Diabetic Eye Exam: Completed 03/18/21.  ? ?Diabetic Foot Exam: Completed 10/09/20.  ? ?Interpreter Needed?: No ? ?Information entered by :: Reather Littler LPN ? ? ?Activities of Daily Living ? ?  07/09/2021  ?  9:56 AM 04/20/2021  ? 11:18 AM  ?In your present state of health, do you have any difficulty performing the following activities:  ?Hearing? 0 0  ?Vision? 0 0  ?Difficulty concentrating or making decisions? 0 1  ?Walking or climbing stairs? 1 1  ?Dressing or bathing? 0 0  ?Doing errands, shopping? 0 0  ?Preparing Food and eating ? N   ?Using the Toilet? N   ?In the past six months, have you accidently leaked urine? Y   ?Comment wears pads for protection   ?Do you have problems with loss of bowel control? N   ?Managing your Medications? N   ?Managing your Finances? N   ?Housekeeping or managing your Housekeeping? N    ? ? ?Patient Care Team: ?Danelle Berry, PA-C as PCP - General (Family Medicine) ?Duanne Limerick (Neurology) ?Midge Minium, MD as Consulting Physician (Gastroenterology) ? ?Indicate any recent Medical Services you may have received

## 2021-07-09 NOTE — Patient Instructions (Signed)
Katherine Stevens , ?Thank you for taking time to come for your Medicare Wellness Visit. I appreciate your ongoing commitment to your health goals. Please review the following plan we discussed and let me know if I can assist you in the future.  ? ?Screening recommendations/referrals: ?Colonoscopy: due ?Mammogram: done 12/09/20 ?Bone Density: due. Please call [phone] to schedule your bone density screening.  ?Recommended yearly ophthalmology/optometry visit for glaucoma screening and checkup ?Recommended yearly dental visit for hygiene and checkup ? ?Vaccinations: ?Influenza vaccine: declined ?Pneumococcal vaccine: done 10/30/14 ?Tdap vaccine: due ?Shingles vaccine: Shingrix discussed. Please contact your pharmacy for coverage information.  ?Covid-19:done 03/25/20 & 04/30/20 ? ?Advanced directives: Advance directive discussed with you today. I have provided a copy for you to complete at home and have notarized. Once this is complete please bring a copy in to our office so we can scan it into your chart.  ? ?Conditions/risks identified: Recommend continuing fall prevention at home ? ?Next appointment: Follow up in one year for your annual wellness visit  ? ? ?Preventive Care 44 Years and Older, Female ?Preventive care refers to lifestyle choices and visits with your health care provider that can promote health and wellness. ?What does preventive care include? ?A yearly physical exam. This is also called an annual well check. ?Dental exams once or twice a year. ?Routine eye exams. Ask your health care provider how often you should have your eyes checked. ?Personal lifestyle choices, including: ?Daily care of your teeth and gums. ?Regular physical activity. ?Eating a healthy diet. ?Avoiding tobacco and drug use. ?Limiting alcohol use. ?Practicing safe sex. ?Taking low-dose aspirin every day. ?Taking vitamin and mineral supplements as recommended by your health care provider. ?What happens during an annual well check? ?The  services and screenings done by your health care provider during your annual well check will depend on your age, overall health, lifestyle risk factors, and family history of disease. ?Counseling  ?Your health care provider may ask you questions about your: ?Alcohol use. ?Tobacco use. ?Drug use. ?Emotional well-being. ?Home and relationship well-being. ?Sexual activity. ?Eating habits. ?History of falls. ?Memory and ability to understand (cognition). ?Work and work Statistician. ?Reproductive health. ?Screening  ?You may have the following tests or measurements: ?Height, weight, and BMI. ?Blood pressure. ?Lipid and cholesterol levels. These may be checked every 5 years, or more frequently if you are over 53 years old. ?Skin check. ?Lung cancer screening. You may have this screening every year starting at age 72 if you have a 30-pack-year history of smoking and currently smoke or have quit within the past 15 years. ?Fecal occult blood test (FOBT) of the stool. You may have this test every year starting at age 23. ?Flexible sigmoidoscopy or colonoscopy. You may have a sigmoidoscopy every 5 years or a colonoscopy every 10 years starting at age 77. ?Hepatitis C blood test. ?Hepatitis B blood test. ?Sexually transmitted disease (STD) testing. ?Diabetes screening. This is done by checking your blood sugar (glucose) after you have not eaten for a while (fasting). You may have this done every 1-3 years. ?Bone density scan. This is done to screen for osteoporosis. You may have this done starting at age 70. ?Mammogram. This may be done every 1-2 years. Talk to your health care provider about how often you should have regular mammograms. ?Talk with your health care provider about your test results, treatment options, and if necessary, the need for more tests. ?Vaccines  ?Your health care provider may recommend certain vaccines, such as: ?Influenza  vaccine. This is recommended every year. ?Tetanus, diphtheria, and acellular  pertussis (Tdap, Td) vaccine. You may need a Td booster every 10 years. ?Zoster vaccine. You may need this after age 49. ?Pneumococcal 13-valent conjugate (PCV13) vaccine. One dose is recommended after age 73. ?Pneumococcal polysaccharide (PPSV23) vaccine. One dose is recommended after age 40. ?Talk to your health care provider about which screenings and vaccines you need and how often you need them. ?This information is not intended to replace advice given to you by your health care provider. Make sure you discuss any questions you have with your health care provider. ?Document Released: 04/18/2015 Document Revised: 12/10/2015 Document Reviewed: 01/21/2015 ?Elsevier Interactive Patient Education ? 2017 [place]. ? ?Fall Prevention in the Home ?Falls can cause injuries. They can happen to people of all ages. There are many things you can do to make your home safe and to help prevent falls. ?What can I do on the outside of my home? ?Regularly fix the edges of walkways and driveways and fix any cracks. ?Remove anything that might make you trip as you walk through a door, such as a raised step or threshold. ?Trim any bushes or trees on the path to your home. ?Use bright outdoor lighting. ?Clear any walking paths of anything that might make someone trip, such as rocks or tools. ?Regularly check to see if handrails are loose or broken. Make sure that both sides of any steps have handrails. ?Any raised decks and porches should have guardrails on the edges. ?Have any leaves, snow, or ice cleared regularly. ?Use sand or salt on walking paths during winter. ?Clean up any spills in your garage right away. This includes oil or grease spills. ?What can I do in the bathroom? ?Use night lights. ?Install grab bars by the toilet and in the tub and shower. Do not use towel bars as grab bars. ?Use non-skid mats or decals in the tub or shower. ?If you need to sit down in the shower, use a plastic, non-slip stool. ?Keep the floor  dry. Clean up any water that spills on the floor as soon as it happens. ?Remove soap buildup in the tub or shower regularly. ?Attach bath mats securely with double-sided non-slip rug tape. ?Do not have throw rugs and other things on the floor that can make you trip. ?What can I do in the bedroom? ?Use night lights. ?Make sure that you have a light by your bed that is easy to reach. ?Do not use any sheets or blankets that are too big for your bed. They should not hang down onto the floor. ?Have a firm chair that has side arms. You can use this for support while you get dressed. ?Do not have throw rugs and other things on the floor that can make you trip. ?What can I do in the kitchen? ?Clean up any spills right away. ?Avoid walking on wet floors. ?Keep items that you use a lot in easy-to-reach places. ?If you need to reach something above you, use a strong step stool that has a grab bar. ?Keep electrical cords out of the way. ?Do not use floor polish or wax that makes floors slippery. If you must use wax, use non-skid floor wax. ?Do not have throw rugs and other things on the floor that can make you trip. ?What can I do with my stairs? ?Do not leave any items on the stairs. ?Make sure that there are handrails on both sides of the stairs and use them. Fix  handrails that are broken or loose. Make sure that handrails are as long as the stairways. ?Check any carpeting to make sure that it is firmly attached to the stairs. Fix any carpet that is loose or worn. ?Avoid having throw rugs at the top or bottom of the stairs. If you do have throw rugs, attach them to the floor with carpet tape. ?Make sure that you have a light switch at the top of the stairs and the bottom of the stairs. If you do not have them, ask someone to add them for you. ?What else can I do to help prevent falls? ?Wear shoes that: ?Do not have high heels. ?Have rubber bottoms. ?Are comfortable and fit you well. ?Are closed at the toe. Do not wear  sandals. ?If you use a stepladder: ?Make sure that it is fully opened. Do not climb a closed stepladder. ?Make sure that both sides of the stepladder are locked into place. ?Ask someone to hold it for you, if po

## 2021-07-20 ENCOUNTER — Ambulatory Visit (INDEPENDENT_AMBULATORY_CARE_PROVIDER_SITE_OTHER): Payer: Medicare Other | Admitting: Family Medicine

## 2021-07-20 ENCOUNTER — Encounter: Payer: Self-pay | Admitting: Family Medicine

## 2021-07-20 VITALS — BP 130/68 | HR 86 | Temp 97.4°F | Resp 16 | Ht 66.0 in | Wt 237.1 lb

## 2021-07-20 DIAGNOSIS — F320 Major depressive disorder, single episode, mild: Secondary | ICD-10-CM

## 2021-07-20 DIAGNOSIS — F419 Anxiety disorder, unspecified: Secondary | ICD-10-CM

## 2021-07-20 DIAGNOSIS — K219 Gastro-esophageal reflux disease without esophagitis: Secondary | ICD-10-CM

## 2021-07-20 DIAGNOSIS — E559 Vitamin D deficiency, unspecified: Secondary | ICD-10-CM

## 2021-07-20 DIAGNOSIS — E039 Hypothyroidism, unspecified: Secondary | ICD-10-CM | POA: Diagnosis not present

## 2021-07-20 DIAGNOSIS — F17200 Nicotine dependence, unspecified, uncomplicated: Secondary | ICD-10-CM

## 2021-07-20 DIAGNOSIS — N3281 Overactive bladder: Secondary | ICD-10-CM

## 2021-07-20 DIAGNOSIS — E119 Type 2 diabetes mellitus without complications: Secondary | ICD-10-CM

## 2021-07-20 DIAGNOSIS — F0280 Dementia in other diseases classified elsewhere without behavioral disturbance: Secondary | ICD-10-CM

## 2021-07-20 DIAGNOSIS — F0150 Vascular dementia without behavioral disturbance: Secondary | ICD-10-CM

## 2021-07-20 DIAGNOSIS — I10 Essential (primary) hypertension: Secondary | ICD-10-CM

## 2021-07-20 DIAGNOSIS — G309 Alzheimer's disease, unspecified: Secondary | ICD-10-CM | POA: Diagnosis not present

## 2021-07-20 DIAGNOSIS — Z1231 Encounter for screening mammogram for malignant neoplasm of breast: Secondary | ICD-10-CM

## 2021-07-20 DIAGNOSIS — M545 Low back pain, unspecified: Secondary | ICD-10-CM

## 2021-07-20 DIAGNOSIS — F5105 Insomnia due to other mental disorder: Secondary | ICD-10-CM

## 2021-07-20 DIAGNOSIS — F99 Mental disorder, not otherwise specified: Secondary | ICD-10-CM

## 2021-07-20 DIAGNOSIS — J302 Other seasonal allergic rhinitis: Secondary | ICD-10-CM

## 2021-07-20 MED ORDER — CITALOPRAM HYDROBROMIDE 20 MG PO TABS: 20.0000 mg | ORAL_TABLET | Freq: Every morning | ORAL | 3 refills | Status: DC

## 2021-07-20 MED ORDER — LEVOCETIRIZINE DIHYDROCHLORIDE 5 MG PO TABS: 5.0000 mg | ORAL_TABLET | Freq: Every evening | ORAL | 1 refills | Status: DC

## 2021-07-20 NOTE — Patient Instructions (Signed)
Health Maintenance  ?Topic Date Due  ? DEXA scan (bone density measurement)  12/18/2017  ? COVID-19 Vaccine (3 - Booster for Moderna series) 08/05/2021*  ? Zoster (Shingles) Vaccine (1 of 2) 10/19/2021*  ? Colon Cancer Screening  07/21/2022*  ? Complete foot exam   10/09/2021  ? Hemoglobin A1C  10/18/2021  ? Flu Shot  11/03/2021  ? Mammogram  12/09/2021  ? Eye exam for diabetics  03/18/2022  ? Pneumonia Vaccine  Completed  ? HPV Vaccine  Aged Out  ? Tetanus Vaccine  Discontinued  ?*Topic was postponed. The date shown is not the original due date.  ? ?[place] at [facility] ?[address] ?[place],  [place]  [id number] ?Get Driving Directions ?Main: [phone] ? ?Call and schedule your mammogram and bone density together in September. ? ? ?

## 2021-07-20 NOTE — Progress Notes (Signed)
? ?Name: Katherine Stevens   MRN: [id number]    DOB: 1945/11/09   Date:07/20/2021 ? ?     Progress Note ? ?Chief Complaint  ?Patient presents with  ? Follow-up  ? Hypertension  ? Gastroesophageal Reflux  ? Hypothyroidism  ? Diabetes  ? Anxiety  ? ? ? ?Subjective:  ? ?Katherine Stevens is a 76 y.o. female, presents to clinic for routine f/up ?She did routine f/up and some labs 3 months ago with Denny Peon PA - float ?At that time POC A1C was 6.3% and it has been well controlled, only on metformin 500 mg once dialy ? ?Hypothyroid on 112 mcg daily, at the time of last appointment with me we had decreased her dose due to abnormally low TSH ?She was having some anxiety and insomnia -this is now resolved.  Of note we also reduced and she has since discontinued her Wellbutrin which was previously prescribed I believe for smoking cessation -I do believe that was causing her anxiety and insomnia ? ?She states she is continue to be compliant with morning dose of levothyroxine 112 mcg daily, her mood and energy level are as good as she has felt in many years ?Lab Results  ?Component Value Date  ? TSH 0.18 (L) 11/20/2020  ? ?She wishes to stay off of Wellbutrin.  She has increased her amount of smoking just slightly from 2 to 3 cigarettes a day to about 5 a day ?Last office visit with me last August we increased Celexa from 10 mg up to 20 mg and she does believe she has been taking 20 mg tablet this whole time she does feel like her mood is much better and she would like to keep all the 20 mg dose. ?Last appointment with flow PA the prescription was changed from 20 mg down to 10 mg.  Patient reports she has both doses at home but is taking 20 mg daily ?Her PHQ-9 depression screening is negative today ? ?  07/20/2021  ?  1:36 PM 07/09/2021  ?  9:50 AM 04/20/2021  ? 11:19 AM  ?Depression screen PHQ 2/9  ?Decreased Interest 0 0 0  ?Down, Depressed, Hopeless 0 0 0  ?PHQ - 2 Score 0 0 0  ?Altered sleeping 0  0  ?Tired, decreased energy 0  0   ?Change in appetite 0  0  ?Feeling bad or failure about yourself  0  0  ?Trouble concentrating 0  0  ?Moving slowly or fidgety/restless 0  0  ?Suicidal thoughts 0  0  ?PHQ-9 Score 0  0  ?Difficult doing work/chores Not difficult at all  Not difficult at all  ? ? ?  07/20/2021  ?  1:36 PM 04/20/2021  ? 11:27 AM 11/20/2020  ? 11:35 AM 10/09/2020  ? 11:41 AM  ?GAD 7 : Generalized Anxiety Score  ?Nervous, Anxious, on Edge 0 0 0 3  ?Control/stop worrying 0 0 0 2  ?Worry too much - different things 0 0 0 1  ?Trouble relaxing 0 0 0 1  ?Restless 0 0 0 0  ?Easily annoyed or irritable 0 0 0 0  ?Afraid - awful might happen 0 0 0 2  ?Total GAD 7 Score 0 0 0 9  ?Anxiety Difficulty Not difficult at all Not difficult at all Not difficult at all Somewhat difficult  ?GAD-7 screening is negative today overall feels good and does not feel agitated anxious or worried does not have trouble controlling worry and is able to sleep at  night ?Denny Peon also refilled her Seroquel which patient was previously rarely using.  Ms. Tapley states she will use half a pill every once in a while but she has a large supply at home and does not need any refills ? ?Hypertension:  ?Currently managed on losartan 50 mg once daily ?Pt reports good med compliance and denies any SE.   ?Blood pressure today is well controlled. ?BP Readings from Last 3 Encounters:  ?07/20/21 130/68  ?04/20/21 132/70  ?11/20/20 122/72  ? ?Pt denies CP, SOB, exertional sx, LE edema, palpitation, Ha's, visual disturbances, lightheadedness, hypotension, syncope. ? ?She is walking with a cane and states that it is getting harder to get around and she is complaining of some low back pain that is limiting her activity she brings in a handicap placard application ? ?Seasonal and nasal allergies- ?Patient has been on loratadine for many years she states that this does not seem to working that well this spring would like to change it to something else ? ? ? ? ? ? ?Current Outpatient  Medications:  ?  atorvastatin (LIPITOR) 40 MG tablet, Take 1 tablet (40 mg total) by mouth at bedtime. For cholesterol, Disp: 90 tablet, Rfl: 3 ?  Cholecalciferol (VITAMIN D3) 50 MCG (2000 UT) capsule, Take 1 capsule (2,000 Units total) by mouth daily., Disp: 90 capsule, Rfl: 3 ?  citalopram (CELEXA) 10 MG tablet, TAKE 1 TABLET(10 MG) BY MOUTH IN THE MORNING, Disp: 90 tablet, Rfl: 3 ?  donepezil (ARICEPT) 10 MG tablet, Take 1 tablet (10 mg total) by mouth at bedtime., Disp: 90 tablet, Rfl: 1 ?  levothyroxine (SYNTHROID) 112 MCG tablet, Take 1 tablet (112 mcg total) by mouth daily before breakfast., Disp: 90 tablet, Rfl: 3 ?  loratadine (CLARITIN) 10 MG tablet, TAKE 1 TABLET(10 MG) BY MOUTH AT BEDTIME FOR ALLERGIES, Disp: 90 tablet, Rfl: 3 ?  losartan (COZAAR) 50 MG tablet, Take 1 tablet (50 mg total) by mouth every morning. For hypertension, Disp: 90 tablet, Rfl: 3 ?  metFORMIN (GLUCOPHAGE) 500 MG tablet, Take 1 tablet (500 mg total) by mouth daily with breakfast. For well controlled diabetes, Disp: 90 tablet, Rfl: 3 ?  oxybutynin (DITROPAN-XL) 5 MG 24 hr tablet, TAKE 1 TABLET(5 MG) BY MOUTH DAILY FOR OVERACTIVE BLADDER, Disp: 90 tablet, Rfl: 3 ?  pantoprazole (PROTONIX) 20 MG tablet, TAKE 1 TABLET(20 MG) BY MOUTH AT BEDTIME FOR GERD OR ABDOMINAL SYMPTOMS, Disp: 90 tablet, Rfl: 3 ?  QUEtiapine (SEROQUEL) 25 MG tablet, TAKE 1 TABLET(25 MG) BY MOUTH AT BEDTIME AS NEEDED FOR INSOMNIA, Disp: 90 tablet, Rfl: 1 ?  buPROPion (WELLBUTRIN) 75 MG tablet, Take 1 (75 mg) tab po with breakfast and 1 tab po with lunch -  for moods and smoking cessation (Patient not taking: Reported on 07/09/2021), Disp: 180 tablet, Rfl: 1 ?  Travoprost, BAK Free, (TRAVATAN) 0.004 % SOLN ophthalmic solution, 1 drop at bedtime. (Patient not taking: Reported on 07/09/2021), Disp: , Rfl:  ? ?Patient Active Problem List  ? Diagnosis Date Noted  ? Vitamin D deficiency 11/20/2020  ? Anxiety 11/20/2020  ? 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  OAB (overactive bladder) 05/01/2020  ? Osteopenia 05/01/2020  ? History of hepatitis C 05/01/2020  ? Abnormality of gait and mobility 05/01/2020  ? Current mild episode of major depressive disorder (HCC) 04/16/2019  ? Type 2 diabetes mellitus without complication, without long-term current use of insulin (HCC) 04/16/2019  ? Cirrhosis of liver without ascites (HCC) 04/16/2019  ?  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 Folate deficiency 04/16/2019  ? Vitamin B1 deficiency 04/16/2019  ? GERD (gastroesophageal reflux disease) 11/22/2018  ? Tobacco dependence 12/12/2015  ? Hyperlipidemia 12/10/2014  ? Osteoarthritis of both knees 08/01/2012  ? Seasonal allergies 07/28/2012  ? Class 1 obesity with serious comorbidity and body mass index (BMI) of 34.0 to 34.9 in adult 07/29/2011  ? Hypothyroidism 12/20/2008  ? Hypertension, benign 12/20/2008  ? ? ?Past Surgical History:  ?Procedure Laterality Date  ? ABDOMINAL HYSTERECTOMY    ? ? ?Family History  ?Problem Relation Age of Onset  ? Heart failure Mother   ? Diabetes Mother   ? Diabetes Sister   ? Hyperlipidemia Sister   ? Thyroid disease Sister   ? Stroke Brother   ? Heart attack Brother   ? Cancer Maternal Grandmother   ? Heart attack Brother   ? ? ?Social History  ? ?Tobacco Use  ? Smoking status: Every Day  ?  Packs/day: 0.25  ?  Types: Cigarettes  ? Smokeless tobacco: Never  ? Tobacco comments:  ?  6-7 cigarettes a day; taking wellbutrin  ?Vaping Use  ? Vaping Use: Never used  ?Substance Use Topics  ? Alcohol use: Not Currently  ? Drug use: Not Currently  ?  ? ?Allergies  ?Allergen Reactions  ? Lisinopril Anaphylaxis  ?  MOUTH & JAW SWELLING  ? ? ?Health Maintenance  ?Topic Date Due  ? DEXA SCAN  12/18/2017  ? COVID-19 Vaccine (3 - Booster for Moderna series) 08/05/2021 (Originally 06/25/2020)  ? Zoster Vaccines- Shingrix (1 of 2) 10/19/2021 (Originally 04/05/1996)  ? COLONOSCOPY (Pts 45-6624yrs Insurance coverage will need to be confirmed)  07/21/2022  (Originally 04/06/1991)  ? FOOT EXAM  10/09/2021  ? HEMOGLOBIN A1C  10/18/2021  ? INFLUENZA VACCINE  11/03/2021  ? MAMMOGRAM  12/09/2021  ? OPHTHALMOLOGY EXAM  03/18/2022  ? Pneumonia Vaccine 4165+ Years old  Completed  ? H

## 2021-07-21 LAB — TSH: TSH: 0.01 mIU/L — ABNORMAL LOW (ref 0.40–4.50)

## 2021-07-31 MED ORDER — LEVOTHYROXINE SODIUM 112 MCG PO TABS: ORAL_TABLET | ORAL | 3 refills | Status: DC

## 2021-07-31 MED ORDER — LEVOTHYROXINE SODIUM 100 MCG PO TABS: ORAL_TABLET | ORAL | 0 refills | Status: DC

## 2021-07-31 NOTE — Addendum Note (Signed)
Addended by: Danelle Berry on: 07/31/2021 11:47 AM ? ? Modules accepted: Orders ? ?

## 2021-08-01 ENCOUNTER — Other Ambulatory Visit: Payer: Self-pay | Admitting: Physician Assistant

## 2021-08-01 DIAGNOSIS — F0280 Dementia in other diseases classified elsewhere without behavioral disturbance: Secondary | ICD-10-CM

## 2021-08-03 ENCOUNTER — Other Ambulatory Visit: Payer: Self-pay

## 2021-08-03 DIAGNOSIS — E039 Hypothyroidism, unspecified: Secondary | ICD-10-CM

## 2021-08-03 NOTE — Telephone Encounter (Signed)
Requested Prescriptions  ?Pending Prescriptions Disp Refills  ?? donepezil (ARICEPT) 10 MG tablet [Pharmacy Med Name: DONEPEZIL 10MG  TABLETS] 90 tablet 1  ?  Sig: TAKE 1 TABLET(10 MG) BY MOUTH AT BEDTIME  ?  ? Neurology:  Alzheimer's Agents Passed - 08/01/2021  7:11 AM  ?  ?  Passed - Valid encounter within last 6 months  ?  Recent Outpatient Visits   ?      ? 2 weeks ago Hypothyroidism, unspecified type  ? [facility], PA-C  ? 3 months ago Type 2 diabetes mellitus without complication, without long-term current use of insulin (HCC)  ? [facility] Mecum, Erin E, PA-C  ? 8 months ago Hypothyroidism, unspecified type  ? [facility] [place], [place], Sheliah Mends  ? 9 months ago Hypothyroidism, unspecified type  ? [facility], PA-C  ? 1 year ago Hypothyroidism, unspecified type  ? [facility], Danelle Berry  ?  ?  ? ?  ?  ?  ? ?

## 2021-08-04 ENCOUNTER — Telehealth: Payer: Self-pay

## 2021-08-04 NOTE — Telephone Encounter (Signed)
Copied from [place]. Topic: General - Inquiry ?>> Aug 03, 2021  4:26 PM Royal Hawthorn, CMA wrote: ?Reason for CRM: Please relay lab result/rx information if patient returns call. ?

## 2021-08-04 NOTE — Telephone Encounter (Signed)
Called, left a VM to return call for lab results. ?

## 2021-08-05 NOTE — Telephone Encounter (Signed)
Patient called, left VM to return the call to the office for lab results.  ?

## 2021-08-10 ENCOUNTER — Telehealth: Payer: Self-pay

## 2021-08-10 NOTE — Telephone Encounter (Signed)
Pt returned our call. ? ?Shared provider's note. PT will pick up medication at pharmacy and start taking as requested. Pt will return for follow up labs are requested. ? ?Patient called.  Unable to reach patient. Please relay lab result/rx information if patient returns call.   ? Danelle Berry, PA-C  ?07/31/2021 11:49 AM EDT   ?  ?I sent in new Rx for 100 mcg tab - take one tab 2 days out of the week ?Sent in new Rx for 112 mcg tab - take one tab 5 days out of the week  ?  ?I ordered a repeat TSH - please put up front ?She will need to have that rechecked in 6-8 weeks (between June 12-23)  ? Danelle Berry, PA-C  ?07/27/2021  2:21 PM EDT   ?  ?Would you please ask her if she thinks she will be able to manage changing her thyroid meds again?  And let me know ?She is feeling really good so I don't want to lower her dose too much, but it is really abnormally low so I have to change it a little ?I was thinking about sending in 100 mcg tab for maybe 2 days a week and she will keep taking the 112 mcg the rest of the days.   ?  ?She can do Monday - Friday her current pill 112 mcg once daily before breakfast and then sat/sun do the new pill 100 mcg once daily before breakfast. ?  ?Please advise   ? ?

## 2021-10-05 ENCOUNTER — Ambulatory Visit: Payer: Medicare Other | Admitting: Family Medicine

## 2021-10-08 ENCOUNTER — Ambulatory Visit (INDEPENDENT_AMBULATORY_CARE_PROVIDER_SITE_OTHER): Payer: Medicare Other | Admitting: Family Medicine

## 2021-10-08 ENCOUNTER — Encounter: Payer: Self-pay | Admitting: Family Medicine

## 2021-10-08 VITALS — BP 126/70 | HR 83 | Temp 98.9°F | Resp 16 | Ht 66.5 in | Wt 237.3 lb

## 2021-10-08 DIAGNOSIS — E119 Type 2 diabetes mellitus without complications: Secondary | ICD-10-CM | POA: Diagnosis not present

## 2021-10-08 DIAGNOSIS — E039 Hypothyroidism, unspecified: Secondary | ICD-10-CM | POA: Diagnosis not present

## 2021-10-08 DIAGNOSIS — Z5181 Encounter for therapeutic drug level monitoring: Secondary | ICD-10-CM

## 2021-10-08 DIAGNOSIS — E785 Hyperlipidemia, unspecified: Secondary | ICD-10-CM

## 2021-10-08 DIAGNOSIS — F0150 Vascular dementia without behavioral disturbance: Secondary | ICD-10-CM

## 2021-10-08 DIAGNOSIS — F320 Major depressive disorder, single episode, mild: Secondary | ICD-10-CM

## 2021-10-08 DIAGNOSIS — I10 Essential (primary) hypertension: Secondary | ICD-10-CM | POA: Diagnosis not present

## 2021-10-08 DIAGNOSIS — N3281 Overactive bladder: Secondary | ICD-10-CM

## 2021-10-08 DIAGNOSIS — K219 Gastro-esophageal reflux disease without esophagitis: Secondary | ICD-10-CM

## 2021-10-08 DIAGNOSIS — F5105 Insomnia due to other mental disorder: Secondary | ICD-10-CM

## 2021-10-08 DIAGNOSIS — F17200 Nicotine dependence, unspecified, uncomplicated: Secondary | ICD-10-CM

## 2021-10-08 DIAGNOSIS — F0280 Dementia in other diseases classified elsewhere without behavioral disturbance: Secondary | ICD-10-CM

## 2021-10-08 DIAGNOSIS — G309 Alzheimer's disease, unspecified: Secondary | ICD-10-CM

## 2021-10-08 DIAGNOSIS — F419 Anxiety disorder, unspecified: Secondary | ICD-10-CM

## 2021-10-08 NOTE — Assessment & Plan Note (Signed)
5 cig a day, refuses to quit Smoking cessation instruction/counseling given:  counseled patient on the dangers of tobacco use, advised patient to stop smoking, and reviewed strategies to maximize success

## 2021-10-08 NOTE — Assessment & Plan Note (Signed)
BP stable and well controlled on losartan Pt will hold the dose occasionally when she thinks BP is low - like today she said at home it was 120/80 - explained optimal BP ranges and encouraged her to take daily and f/up if BP <100/60 or any lightheadedness or near syncope

## 2021-10-08 NOTE — Assessment & Plan Note (Signed)
    10/08/2021    2:53 PM 07/20/2021    1:36 PM 07/09/2021    9:50 AM  Depression screen PHQ 2/9  Decreased Interest 0 0 0  Down, Depressed, Hopeless 0 0 0  PHQ - 2 Score 0 0 0  Altered sleeping 0 0   Tired, decreased energy 0 0   Change in appetite 0 0   Feeling bad or failure about yourself  0 0   Trouble concentrating 0 0   Moving slowly or fidgety/restless 0 0   Suicidal thoughts 0 0   PHQ-9 Score 0 0   Difficult doing work/chores Not difficult at all Not difficult at all    phq reviewed and negative She endorses still having some anxiety and startles easily Fairly well controlled on celexa 20 mg

## 2021-10-08 NOTE — Progress Notes (Signed)
Name: Katherine Stevens   MRN: [id number]    DOB: 09-13-1945   Date:10/08/2021       Progress Note  Chief Complaint  Patient presents with   Follow-up   Hypertension   Gastroesophageal Reflux   Diabetes   Anxiety     Subjective:   Katherine Stevens is a 76 y.o. female, presents to clinic for routine f/up  Hypothyroid, difficult to get TSH in normal range with very minimal dose changes Previously if dose was reduced by 13 mcg a few days a week her TSH would go abnormally high, for the past several OV her TSH was abnormally low Dose was reduced and she did not come in for lab recheck as previously asked to  Currently taking 100 mcg 2d a week and 112 mcg 5 d a week Lab Results  Component Value Date   TSH 0.01 (L) 07/20/2021   TSH 0.18 (L) 11/20/2020   TSH 0.02 (L) 10/09/2020   TSH 0.03 (L) 05/01/2020   TSH 0.01 (L) 01/31/2020   TSH 5.13 (H) 11/29/2019   TSH 0.01 (L) 10/18/2019   TSH 0.01 (L) 09/06/2019   TSH 0.02 (L) 07/24/2019   TSH 8.18 (H) 05/18/2019   She denies palpitations, CP, nervousness, hot intolerance, sweats, weightloss She feels low energy and worse than last OV  Hypertension:  Currently managed on losartan Blood pressure today is well controlled. BP Readings from Last 3 Encounters:  10/08/21 126/70  07/20/21 130/68  04/20/21 132/70   Pt denies CP, SOB, exertional sx, LE edema, palpitation, Ha's, visual disturbances, lightheadedness, hypotension, syncope.   Hyperlipidemia: Currently treated with lipitor 40, pt reports good med compliance Last Lipids: Lab Results  Component Value Date   CHOL 113 10/09/2020   HDL 59 10/09/2020   LDLCALC 38 10/09/2020   TRIG 76 10/09/2020   CHOLHDL 1.9 10/09/2020   - Denies: Chest pain, shortness of breath, myalgias, claudication  GERD well controlled  DM:   Pt managing DM with metformin 500 mg once daily, has been well controlled  Reports good med compliance Pt has no SE from meds. Blood sugars a few times a  week, states sugars higher 150's a few times Denies: Polyuria, polydipsia, vision changes, neuropathy, hypoglycemia Recent pertinent labs: Lab Results  Component Value Date   HGBA1C 6.3 (A) 04/20/2021   HGBA1C 6.1 (H) 10/09/2020   HGBA1C 6.0 (H) 05/01/2020   Lab Results  Component Value Date   MICROALBUR 0.7 07/26/2019   LDLCALC 38 10/09/2020   CREATININE 0.95 (H) 10/09/2020   Standard of care and health maintenance: Urine Microalbumin:  due Foot exam:  due - done today DM eye exam:  due ACEI/ARB:  losartan Statin:  lipitor 40 mg           Current Outpatient Medications:    atorvastatin (LIPITOR) 40 MG tablet, Take 1 tablet (40 mg total) by mouth at bedtime. For cholesterol, Disp: 90 tablet, Rfl: 3   Cholecalciferol (VITAMIN D3) 50 MCG (2000 UT) capsule, Take 1 capsule (2,000 Units total) by mouth daily., Disp: 90 capsule, Rfl: 3   citalopram (CELEXA) 20 MG tablet, Take 1 tablet (20 mg total) by mouth in the morning., Disp: 90 tablet, Rfl: 3   donepezil (ARICEPT) 10 MG tablet, TAKE 1 TABLET(10 MG) BY MOUTH AT BEDTIME, Disp: 90 tablet, Rfl: 0   levocetirizine (XYZAL) 5 MG tablet, Take 1 tablet (5 mg total) by mouth every evening., Disp: 90 tablet, Rfl: 1   levothyroxine (SYNTHROID) 100 MCG tablet,  Take 1 tab (100 mcg) po 2 days a week in am 1 hour before breakfast, other 5 d a week take 112 mcg tab, Disp: 24 tablet, Rfl: 0   levothyroxine (SYNTHROID) 112 MCG tablet, Take 1 tablet (112 mcg total) by mouth daily before breakfast 5 days a week (and take 100 mcg tab the other 2 days a week), Disp: 70 tablet, Rfl: 3   losartan (COZAAR) 50 MG tablet, Take 1 tablet (50 mg total) by mouth every morning. For hypertension, Disp: 90 tablet, Rfl: 3   metFORMIN (GLUCOPHAGE) 500 MG tablet, Take 1 tablet (500 mg total) by mouth daily with breakfast. For well controlled diabetes, Disp: 90 tablet, Rfl: 3   oxybutynin (DITROPAN-XL) 5 MG 24 hr tablet, TAKE 1 TABLET(5 MG) BY MOUTH DAILY FOR  OVERACTIVE BLADDER, Disp: 90 tablet, Rfl: 3   pantoprazole (PROTONIX) 20 MG tablet, TAKE 1 TABLET(20 MG) BY MOUTH AT BEDTIME FOR GERD OR ABDOMINAL SYMPTOMS, Disp: 90 tablet, Rfl: 3   QUEtiapine (SEROQUEL) 25 MG tablet, TAKE 1 TABLET(25 MG) BY MOUTH AT BEDTIME AS NEEDED FOR INSOMNIA, Disp: 90 tablet, Rfl: 1   Travoprost, BAK Free, (TRAVATAN) 0.004 % SOLN ophthalmic solution, 1 drop at bedtime., Disp: , Rfl:   Patient Active Problem List   Diagnosis Date Noted   Vitamin D deficiency 11/20/2020   Anxiety 11/20/2020   Insomnia due to other mental disorder 05/01/2020   OAB (overactive bladder) 05/01/2020   Osteopenia 05/01/2020   History of hepatitis C 05/01/2020   Abnormality of gait and mobility 05/01/2020   Current mild episode of major depressive disorder (HCC) 04/16/2019   Type 2 diabetes mellitus without complication, without long-term current use of insulin (HCC) 04/16/2019   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/16/2019   Folate deficiency 04/16/2019   Vitamin B1 deficiency 04/16/2019   GERD (gastroesophageal reflux disease) 11/22/2018   Tobacco dependence 12/12/2015   Hyperlipidemia 12/10/2014   Osteoarthritis of both knees 08/01/2012   Seasonal allergies 07/28/2012   Class 1 obesity with serious comorbidity and body mass index (BMI) of 34.0 to 34.9 in adult 07/29/2011   Hypothyroidism 12/20/2008   Hypertension, benign 12/20/2008    Past Surgical History:  Procedure Laterality Date   ABDOMINAL HYSTERECTOMY      Family History  Problem Relation Age of Onset   Heart failure Mother    Diabetes Mother    Diabetes Sister    Hyperlipidemia Sister    Thyroid disease Sister    Stroke Brother    Heart attack Brother    Cancer Maternal Grandmother    Heart attack Brother     Social History   Tobacco Use   Smoking status: Every Day    Packs/day: 0.25    Types: Cigarettes   Smokeless tobacco: Never   Tobacco comments:    5 cig a day (2 packs lasts her 8 days)  not taking wellbutrin any more  Vaping Use   Vaping Use: Never used  Substance Use Topics   Alcohol use: Not Currently   Drug use: Not Currently     Allergies  Allergen Reactions   Lisinopril Anaphylaxis    MOUTH & JAW SWELLING    Health Maintenance  Topic Date Due   DEXA SCAN  12/18/2017   COVID-19 Vaccine (5 - Moderna series) 02/20/2021   Zoster Vaccines- Shingrix (1 of 2) 10/19/2021 (Originally 04/05/1996)   COLONOSCOPY (Pts 45-106yrs Insurance coverage will need to be confirmed)  07/21/2022 (Originally 04/06/1991)   FOOT EXAM  10/09/2021   HEMOGLOBIN A1C  10/18/2021   INFLUENZA VACCINE  11/03/2021   MAMMOGRAM  12/09/2021   OPHTHALMOLOGY EXAM  03/18/2022   Pneumonia Vaccine 108+ Years old  Completed   HPV VACCINES  Aged Out   TETANUS/TDAP  Discontinued    Chart Review Today: I have reviewed the patient's medical history in detail and updated the computerized patient record.   Review of Systems  Constitutional: Negative.   HENT: Negative.    Eyes: Negative.   Respiratory: Negative.    Cardiovascular: Negative.   Gastrointestinal: Negative.   Endocrine: Negative.   Genitourinary: Negative.   Musculoskeletal: Negative.   Skin: Negative.   Allergic/Immunologic: Negative.   Neurological: Negative.   Hematological: Negative.   Psychiatric/Behavioral: Negative.    All other systems reviewed and are negative.    Objective:   Vitals:   10/08/21 1451  BP: 126/70  Pulse: 83  Resp: 16  Temp: 98.9 F (37.2 C)  TempSrc: Oral  SpO2: 97%  Weight: 237 lb 4.8 oz (107.6 kg)  Height: 5' 6.5" (1.689 m)    Body mass index is 37.73 kg/m.  Physical Exam Vitals and nursing note reviewed.  Constitutional:      General: She is not in acute distress.    Appearance: Normal appearance. She is well-developed. She is obese. She is not ill-appearing, toxic-appearing or diaphoretic.     Interventions: Face mask in place.  HENT:     Head: Normocephalic and atraumatic.      Right Ear: External ear normal.     Left Ear: External ear normal.  Eyes:     General: Lids are normal. No scleral icterus.       Right eye: No discharge.        Left eye: No discharge.     Conjunctiva/sclera: Conjunctivae normal.  Neck:     Trachea: Phonation normal. No tracheal deviation.  Cardiovascular:     Rate and Rhythm: Normal rate and regular rhythm.     Pulses: Normal pulses.          Radial pulses are 2+ on the right side and 2+ on the left side.       Posterior tibial pulses are 2+ on the right side and 2+ on the left side.     Heart sounds: Normal heart sounds. No murmur heard.    No friction rub. No gallop.  Pulmonary:     Effort: Pulmonary effort is normal. No respiratory distress.     Breath sounds: Normal breath sounds. No stridor. No wheezing, rhonchi or rales.  Chest:     Chest wall: No tenderness.  Abdominal:     General: Bowel sounds are normal. There is no distension.     Palpations: Abdomen is soft.  Musculoskeletal:     Right lower leg: No edema.     Left lower leg: No edema.  Skin:    General: Skin is warm and dry.     Coloration: Skin is not jaundiced or pale.     Findings: No rash.  Neurological:     Mental Status: She is alert.     Motor: No abnormal muscle tone.     Gait: Gait normal.  Psychiatric:        Mood and Affect: Mood normal.        Speech: Speech normal.        Behavior: Behavior normal. Behavior is cooperative.         Assessment & Plan:   Problem  List Items Addressed This Visit       Cardiovascular and Mediastinum   Mixed Alzheimer's and vascular dementia ([place])    Managed by neuro, on aricept, she appears to be at her baseline MS      Hypertension, benign    BP stable and well controlled on losartan Pt will hold the dose occasionally when she thinks BP is low - like today she said at home it was 120/80 - explained optimal BP ranges and encouraged her to take daily and f/up if BP <100/60 or any lightheadedness or near  syncope      Relevant Orders   COMPLETE METABOLIC PANEL WITH GFR   CBC with Differential/Platelet     Digestive   GERD (gastroesophageal reflux disease)    Well controlled sx with diet changes        Endocrine   Type 2 diabetes mellitus without complication, without long-term current use of insulin (HCC)    On 500 mg once daily metformin with well controlled DM Good med compliance Monitoring sugars with only few fasting CBGs in 150's Denies: Polyuria, polydipsia, vision changes, neuropathy, hypoglycemia Recent pertinent labs: Lab Results  Component Value Date   HGBA1C 6.3 (A) 04/20/2021   HGBA1C 6.1 (H) 10/09/2020   HGBA1C 6.0 (H) 05/01/2020   Foot exam done today On statin, ARB, ASA and est with ophtho for DM eye exam      Relevant Orders   Hemoglobin A1c   CBC with Differential/Platelet   Urine Microalbumin w/creat. ratio   Hypothyroidism - Primary    ifficult to get TSH in normal range with very minimal dose changes Previously if dose was reduced by 13 mcg a few days a week her TSH would go abnormally high, for the past several OV her TSH was abnormally low Dose was reduced and she did not come in for lab recheck as previously asked to  Currently taking 100 mcg 2d a week and 112 mcg 5 d a week Lab Results  Component Value Date   TSH 0.01 (L) 07/20/2021   TSH 0.18 (L) 11/20/2020   TSH 0.02 (L) 10/09/2020   TSH 0.03 (L) 05/01/2020   TSH 0.01 (L) 01/31/2020   TSH 5.13 (H) 11/29/2019   TSH 0.01 (L) 10/18/2019   TSH 0.01 (L) 09/06/2019   TSH 0.02 (L) 07/24/2019   TSH 8.18 (H) 05/18/2019  She denies palpitations, CP, nervousness, hot intolerance, sweats, weightloss She feels low energy and worse than last OV  Will adjust med dose based on lab results - she endorses worse energy and fatigue with only a 24 mcg dose adjustment per week, she may need endo or to try other form on levothyroxine       Relevant Orders   TSH     Genitourinary   OAB (overactive  bladder)    Sx well controlled with oxybutinin        Other   Tobacco dependence    5 cig a day, refuses to quit Smoking cessation instruction/counseling given:  counseled patient on the dangers of tobacco use, advised patient to stop smoking, and reviewed strategies to maximize success       Current mild episode of major depressive disorder ([place])       10/08/2021    2:53 PM 07/20/2021    1:36 PM 07/09/2021    9:50 AM  Depression screen PHQ 2/9  Decreased Interest 0 0 0  Down, Depressed, Hopeless 0 0 0  PHQ - 2 Score 0  0 0  Altered sleeping 0 0   Tired, decreased energy 0 0   Change in appetite 0 0   Feeling bad or failure about yourself  0 0   Trouble concentrating 0 0   Moving slowly or fidgety/restless 0 0   Suicidal thoughts 0 0   PHQ-9 Score 0 0   Difficult doing work/chores Not difficult at all Not difficult at all   phq reviewed and negative She endorses still having some anxiety and startles easily Fairly well controlled on celexa 20 mg      Hyperlipidemia    Compliant with meds, no SE, no myalgias, fatigue or jaundice Due for FLP and recheck CMP Continue lipitor 40 if labs still at goal       Relevant Orders   Lipid panel   Anxiety   Other Visit Diagnoses     Encounter for medication monitoring       Relevant Orders   COMPLETE METABOLIC PANEL WITH GFR   TSH   CBC with Differential/Platelet   Urine Microalbumin w/creat. ratio   Lipid panel        No follow-ups on file.   Delsa Grana, PA-C 10/08/21 3:08 PM

## 2021-10-08 NOTE — Assessment & Plan Note (Signed)
Well controlled sx with diet changes

## 2021-10-08 NOTE — Assessment & Plan Note (Signed)
Sx well controlled with oxybutinin

## 2021-10-08 NOTE — Assessment & Plan Note (Signed)
Compliant with meds, no SE, no myalgias, fatigue or jaundice Due for FLP and recheck CMP Continue lipitor 40 if labs still at goal

## 2021-10-08 NOTE — Assessment & Plan Note (Signed)
Managed by neuro, on aricept, she appears to be at her baseline MS

## 2021-10-08 NOTE — Assessment & Plan Note (Signed)
On 500 mg once daily metformin with well controlled DM Good med compliance Monitoring sugars with only few fasting CBGs in 150's Denies: Polyuria, polydipsia, vision changes, neuropathy, hypoglycemia Recent pertinent labs: Lab Results  Component Value Date   HGBA1C 6.3 (A) 04/20/2021   HGBA1C 6.1 (H) 10/09/2020   HGBA1C 6.0 (H) 05/01/2020   Foot exam done today On statin, ARB, ASA and est with ophtho for DM eye exam

## 2021-10-08 NOTE — Assessment & Plan Note (Signed)
ifficult to get TSH in normal range with very minimal dose changes Previously if dose was reduced by 13 mcg a few days a week her TSH would go abnormally high, for the past several OV her TSH was abnormally low Dose was reduced and she did not come in for lab recheck as previously asked to  Currently taking 100 mcg 2d a week and 112 mcg 5 d a week Lab Results  Component Value Date   TSH 0.01 (L) 07/20/2021   TSH 0.18 (L) 11/20/2020   TSH 0.02 (L) 10/09/2020   TSH 0.03 (L) 05/01/2020   TSH 0.01 (L) 01/31/2020   TSH 5.13 (H) 11/29/2019   TSH 0.01 (L) 10/18/2019   TSH 0.01 (L) 09/06/2019   TSH 0.02 (L) 07/24/2019   TSH 8.18 (H) 05/18/2019   She denies palpitations, CP, nervousness, hot intolerance, sweats, weightloss She feels low energy and worse than last OV  Will adjust med dose based on lab results - she endorses worse energy and fatigue with only a 24 mcg dose adjustment per week, she may need endo or to try other form on levothyroxine

## 2021-10-09 ENCOUNTER — Other Ambulatory Visit: Payer: Self-pay | Admitting: Family Medicine

## 2021-10-09 DIAGNOSIS — E039 Hypothyroidism, unspecified: Secondary | ICD-10-CM

## 2021-10-09 LAB — COMPLETE METABOLIC PANEL WITH GFR
AG Ratio: 1.1 (calc) (ref 1.0–2.5)
ALT: 12 U/L (ref 6–29)
AST: 17 U/L (ref 10–35)
Albumin: 3.9 g/dL (ref 3.6–5.1)
Alkaline phosphatase (APISO): 153 U/L (ref 37–153)
BUN: 10 mg/dL (ref 7–25)
CO2: 23 mmol/L (ref 20–32)
Calcium: 9 mg/dL (ref 8.6–10.4)
Chloride: 111 mmol/L — ABNORMAL HIGH (ref 98–110)
Creat: 0.94 mg/dL (ref 0.60–1.00)
Globulin: 3.5 g/dL (calc) (ref 1.9–3.7)
Glucose, Bld: 136 mg/dL — ABNORMAL HIGH (ref 65–99)
Potassium: 4.1 mmol/L (ref 3.5–5.3)
Sodium: 144 mmol/L (ref 135–146)
Total Bilirubin: 0.4 mg/dL (ref 0.2–1.2)
Total Protein: 7.4 g/dL (ref 6.1–8.1)
eGFR: 63 mL/min/{1.73_m2} (ref >=60)

## 2021-10-09 LAB — HEMOGLOBIN A1C
Hgb A1c MFr Bld: 6.6 % of total Hgb — ABNORMAL HIGH (ref <5.7)
Mean Plasma Glucose: 143 mg/dL
eAG (mmol/L): 7.9 mmol/L

## 2021-10-09 LAB — LIPID PANEL
Cholesterol: 84 mg/dL (ref <200)
HDL: 48 mg/dL — ABNORMAL LOW (ref >=50)
LDL Cholesterol (Calc): 21 mg/dL (calc)
Non-HDL Cholesterol (Calc): 36 mg/dL (calc) (ref <130)
Total CHOL/HDL Ratio: 1.8 (calc) (ref <5.0)
Triglycerides: 69 mg/dL (ref <150)

## 2021-10-09 LAB — CBC WITH DIFFERENTIAL/PLATELET
Absolute Monocytes: 745 cells/uL (ref 200–950)
Basophils Absolute: 31 cells/uL (ref 0–200)
Basophils Relative: 0.3 %
Eosinophils Absolute: 173 cells/uL (ref 15–500)
Eosinophils Relative: 1.7 %
HCT: 36.5 % (ref 35.0–45.0)
Hemoglobin: 11.8 g/dL (ref 11.7–15.5)
Lymphs Abs: 3886 cells/uL (ref 850–3900)
MCH: 28.1 pg (ref 27.0–33.0)
MCHC: 32.3 g/dL (ref 32.0–36.0)
MCV: 86.9 fL (ref 80.0–100.0)
MPV: 10 fL (ref 7.5–12.5)
Monocytes Relative: 7.3 %
Neutro Abs: 5365 cells/uL (ref 1500–7800)
Neutrophils Relative %: 52.6 %
Platelets: 274 10*3/uL (ref 140–400)
RBC: 4.2 10*6/uL (ref 3.80–5.10)
RDW: 13.4 % (ref 11.0–15.0)
Total Lymphocyte: 38.1 %
WBC: 10.2 10*3/uL (ref 3.8–10.8)

## 2021-10-09 LAB — TSH: TSH: 0.02 mIU/L — ABNORMAL LOW (ref 0.40–4.50)

## 2021-10-09 MED ORDER — LEVOTHYROXINE SODIUM 100 MCG PO TABS: 100.0000 ug | ORAL_TABLET | Freq: Every day | ORAL | 1 refills | Status: DC

## 2021-11-20 ENCOUNTER — Other Ambulatory Visit: Payer: Self-pay | Admitting: Family Medicine

## 2021-11-20 DIAGNOSIS — E039 Hypothyroidism, unspecified: Secondary | ICD-10-CM

## 2021-11-20 LAB — MICROALBUMIN / CREATININE URINE RATIO
Creatinine, Urine: 57 mg/dL (ref 20–275)
Microalb Creat Ratio: 9 mcg/mg creat (ref <30)
Microalb, Ur: 0.5 mg/dL

## 2021-11-20 LAB — TSH: TSH: 0.01 mIU/L — ABNORMAL LOW (ref 0.40–4.50)

## 2021-11-20 MED ORDER — LEVOTHYROXINE SODIUM 88 MCG PO TABS: 88.0000 ug | ORAL_TABLET | Freq: Every day | ORAL | 0 refills | Status: DC

## 2021-11-21 IMAGING — MG MM DIGITAL SCREENING BILAT W/ TOMO AND CAD
8 series · 8 of 24 positions shown · non-contrast
Comparison: Previous exam(s).

CLINICAL DATA: Screening.

EXAM:
DIGITAL SCREENING BILATERAL MAMMOGRAM WITH TOMOSYNTHESIS AND CAD
TECHNIQUE: Bilateral screening digital craniocaudal and mediolateral oblique
mammograms were obtained. Bilateral screening digital breast
tomosynthesis was performed. The images were evaluated with
computer-aided detection.

[L CC synth-2D]
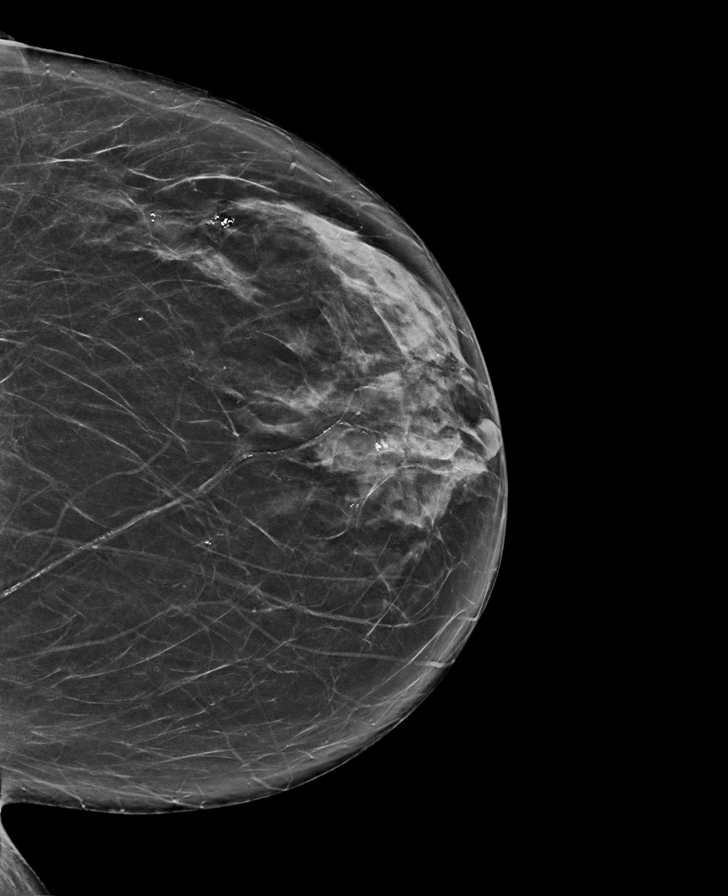

[R MLO synth-2D]
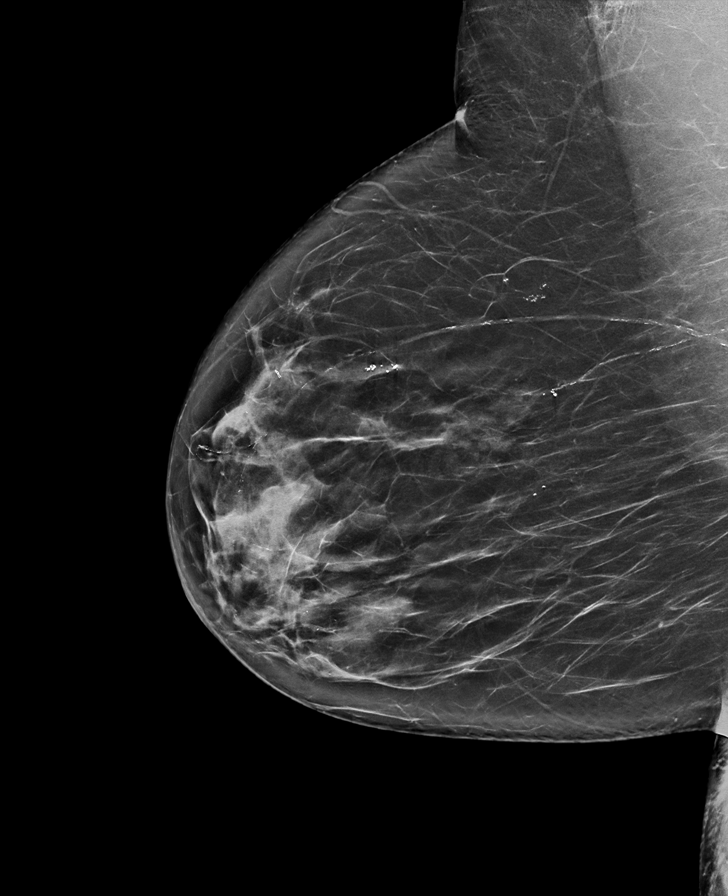

[R CC synth-2D]
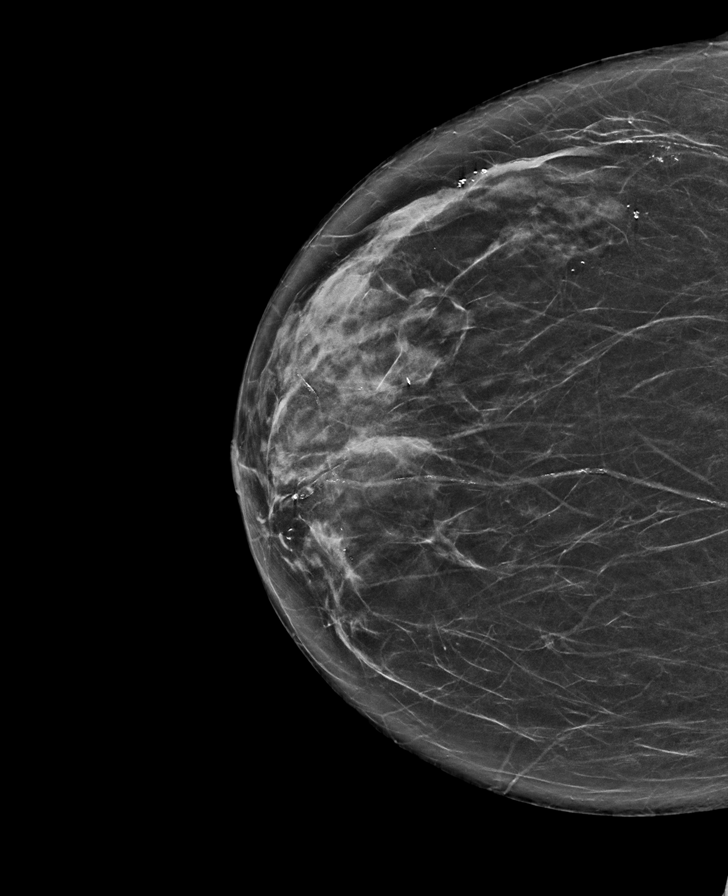

[L MLO synth-2D]
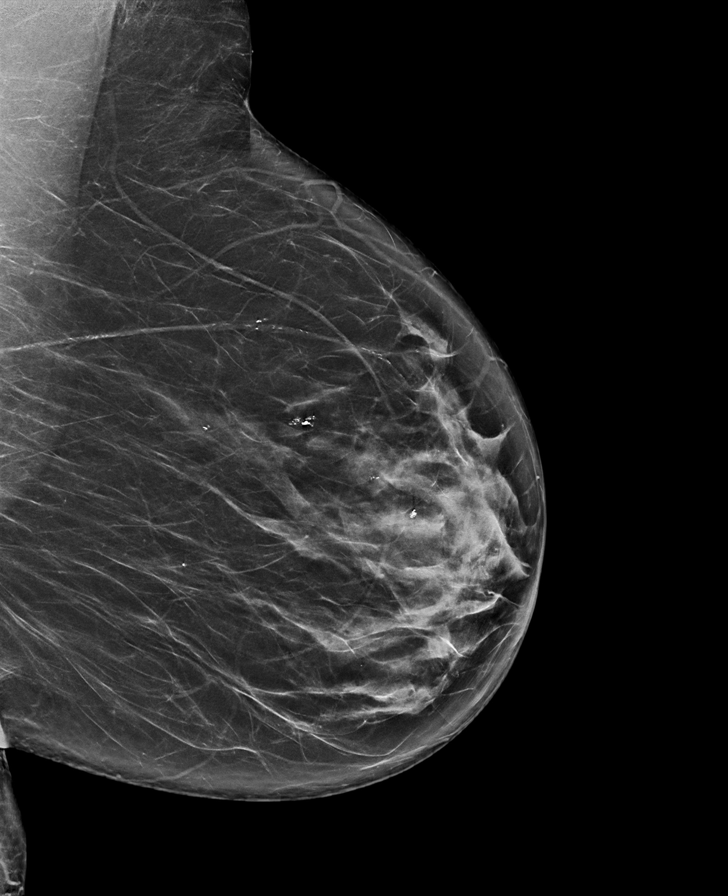

[R CC tomo · tomo slice 33/65.0]
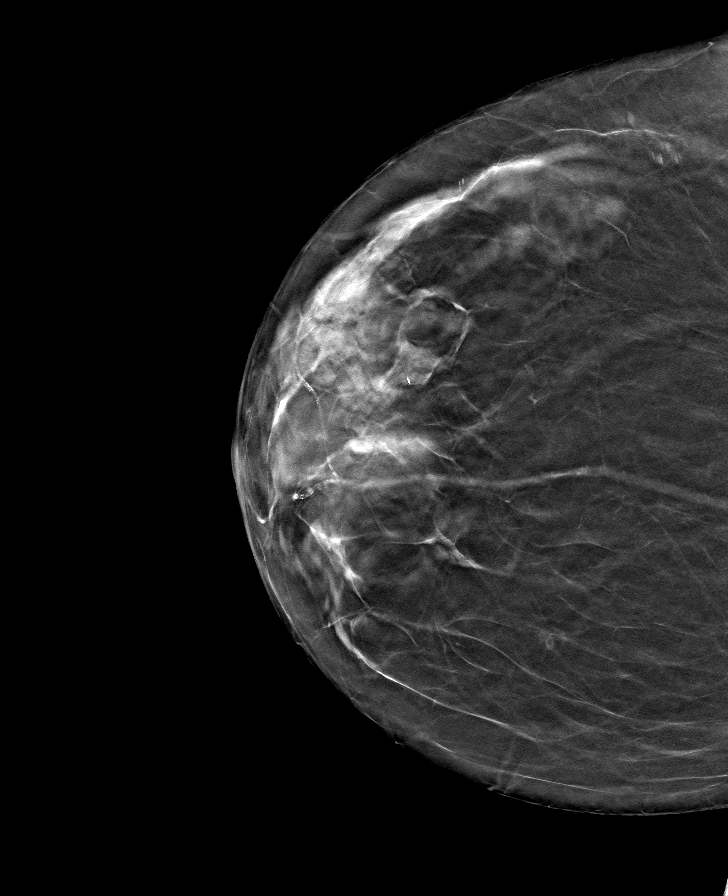

[L MLO tomo · tomo slice 39/78.0]
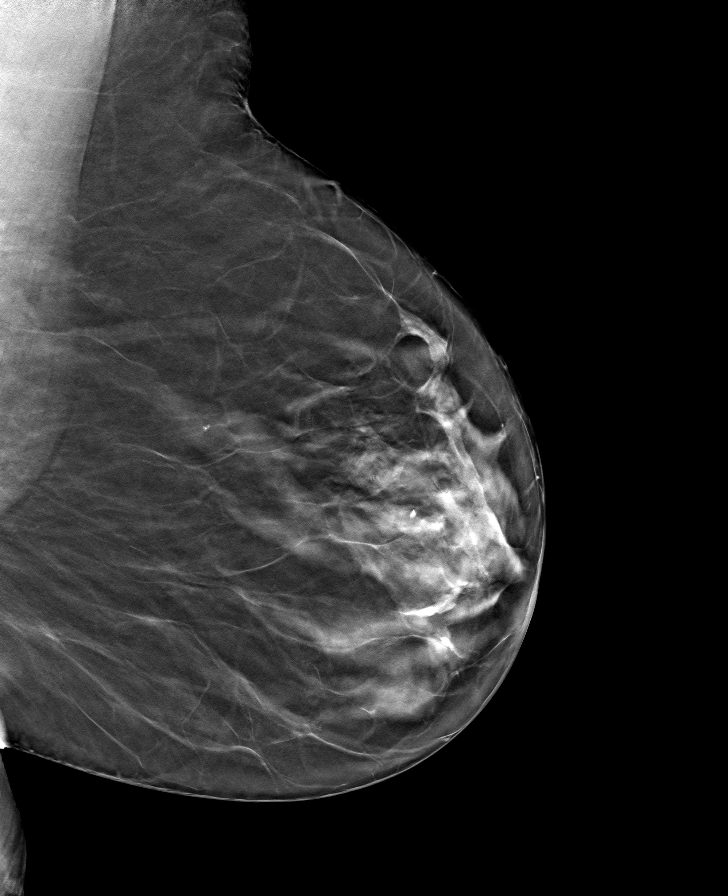

[R MLO tomo · tomo slice 41/80.0]
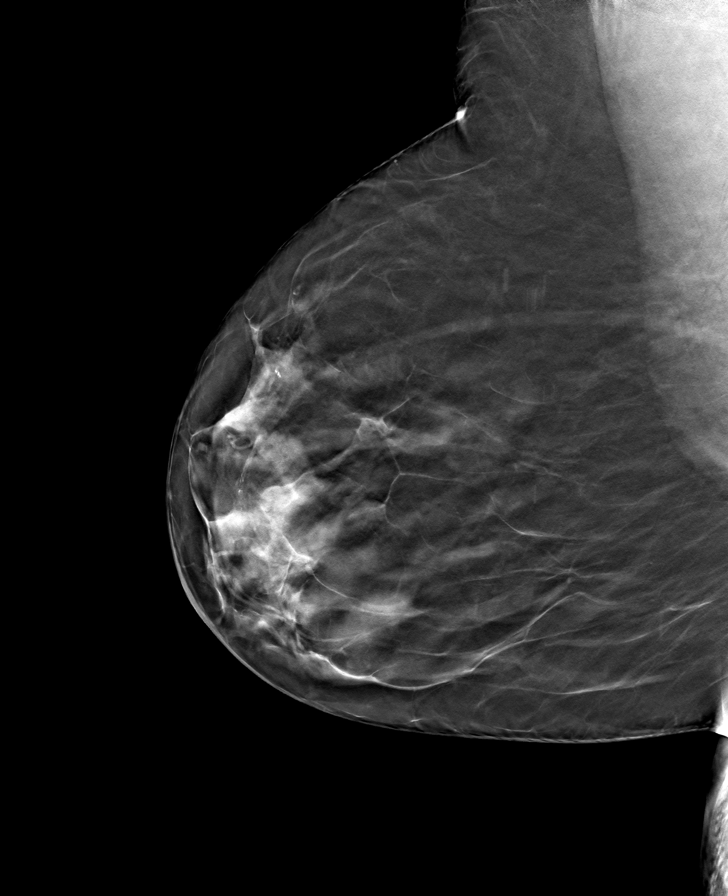

[L CC tomo · tomo slice 36/71.0]
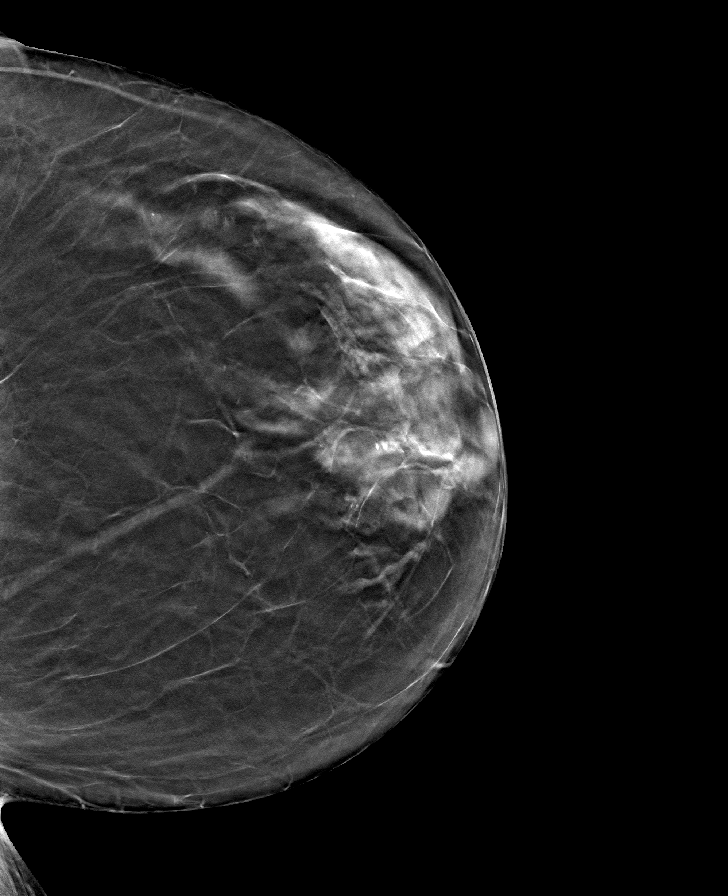

[8 of 24 positions shown; findings below may reference images not displayed]

ACR Breast Density Category c: The breast tissue is heterogeneously
dense, which may obscure small masses.
FINDINGS: There are no findings suspicious for malignancy.
IMPRESSION: No mammographic evidence of malignancy. A result letter of this
screening mammogram will be mailed directly to the patient.

RECOMMENDATION:
Screening mammogram in one year. (Code:Q3-W-BC3)

BI-RADS CATEGORY  1: Negative.

## 2021-11-25 ENCOUNTER — Other Ambulatory Visit: Payer: Self-pay | Admitting: Family Medicine

## 2021-11-25 DIAGNOSIS — F0280 Dementia in other diseases classified elsewhere without behavioral disturbance: Secondary | ICD-10-CM

## 2021-11-25 NOTE — Telephone Encounter (Signed)
Pt is calling back to follow up on medication refill.  Pt stated only has one tablet left.   Please advise.

## 2022-01-05 ENCOUNTER — Telehealth: Payer: Self-pay | Admitting: Physician Assistant

## 2022-01-05 DIAGNOSIS — F320 Major depressive disorder, single episode, mild: Secondary | ICD-10-CM

## 2022-01-05 DIAGNOSIS — F419 Anxiety disorder, unspecified: Secondary | ICD-10-CM

## 2022-01-05 NOTE — Telephone Encounter (Signed)
Not on current med list. Requested Prescriptions  Pending Prescriptions Disp Refills  . buPROPion (WELLBUTRIN) 75 MG tablet [Pharmacy Med Name: BUPROPION 75MG  TABLETS] 180 tablet 1    Sig: TAKE 1 TABLET(75 MG) BY MOUTH WITH BREAKFAST AND WITH LUNCH FOR MOOD OR SMOKING CESSATION     Psychiatry: Antidepressants - bupropion Passed - 01/05/2022 12:52 PM      Passed - Cr in normal range and within 360 days    Creat  Date Value Ref Range Status  10/08/2021 0.94 0.60 - 1.00 mg/dL Final   Creatinine, Urine  Date Value Ref Range Status  11/19/2021 57 20 - 275 mg/dL Final         Passed - AST in normal range and within 360 days    AST  Date Value Ref Range Status  10/08/2021 17 10 - 35 U/L Final         Passed - ALT in normal range and within 360 days    ALT  Date Value Ref Range Status  10/08/2021 12 6 - 29 U/L Final         Passed - Completed PHQ-2 or PHQ-9 in the last 360 days      Passed - Last BP in normal range    BP Readings from Last 1 Encounters:  10/08/21 126/70         Passed - Valid encounter within last 6 months    Recent Outpatient Visits          2 months ago Hypothyroidism, unspecified type   [facility] [place], Kristeen Miss, PA-C   5 months ago Hypothyroidism, unspecified type   [facility] [place], Kristeen Miss, PA-C   8 months ago Type 2 diabetes mellitus without complication, without long-term current use of insulin [facility])   [place], Dani Gobble, PA-C   1 year ago Hypothyroidism, unspecified type   [facility] Delsa Grana, PA-C   1 year ago Hypothyroidism, unspecified type   [facility] Delsa Grana, PA-C      Future Appointments            In 3 months Delsa Grana, PA-C [facility]

## 2022-01-06 ENCOUNTER — Encounter: Payer: Self-pay | Admitting: Family Medicine

## 2022-01-06 ENCOUNTER — Ambulatory Visit (INDEPENDENT_AMBULATORY_CARE_PROVIDER_SITE_OTHER): Payer: Medicare Other | Admitting: Family Medicine

## 2022-01-06 VITALS — BP 132/76 | HR 91 | Temp 98.4°F | Resp 16 | Ht 66.5 in | Wt 238.0 lb

## 2022-01-06 DIAGNOSIS — E119 Type 2 diabetes mellitus without complications: Secondary | ICD-10-CM

## 2022-01-06 DIAGNOSIS — Z6837 Body mass index (BMI) 37.0-37.9, adult: Secondary | ICD-10-CM

## 2022-01-06 DIAGNOSIS — F320 Major depressive disorder, single episode, mild: Secondary | ICD-10-CM | POA: Diagnosis not present

## 2022-01-06 DIAGNOSIS — R269 Unspecified abnormalities of gait and mobility: Secondary | ICD-10-CM

## 2022-01-06 DIAGNOSIS — Z716 Tobacco abuse counseling: Secondary | ICD-10-CM | POA: Diagnosis not present

## 2022-01-06 DIAGNOSIS — F0150 Vascular dementia without behavioral disturbance: Secondary | ICD-10-CM

## 2022-01-06 DIAGNOSIS — F17200 Nicotine dependence, unspecified, uncomplicated: Secondary | ICD-10-CM

## 2022-01-06 DIAGNOSIS — E039 Hypothyroidism, unspecified: Secondary | ICD-10-CM | POA: Diagnosis not present

## 2022-01-06 DIAGNOSIS — G309 Alzheimer's disease, unspecified: Secondary | ICD-10-CM | POA: Diagnosis not present

## 2022-01-06 DIAGNOSIS — F99 Mental disorder, not otherwise specified: Secondary | ICD-10-CM

## 2022-01-06 DIAGNOSIS — I10 Essential (primary) hypertension: Secondary | ICD-10-CM

## 2022-01-06 DIAGNOSIS — J302 Other seasonal allergic rhinitis: Secondary | ICD-10-CM

## 2022-01-06 DIAGNOSIS — Z5181 Encounter for therapeutic drug level monitoring: Secondary | ICD-10-CM

## 2022-01-06 DIAGNOSIS — F5105 Insomnia due to other mental disorder: Secondary | ICD-10-CM

## 2022-01-06 DIAGNOSIS — F0280 Dementia in other diseases classified elsewhere without behavioral disturbance: Secondary | ICD-10-CM

## 2022-01-06 MED ORDER — BUPROPION HCL 75 MG PO TABS: ORAL_TABLET | ORAL | 1 refills | Status: DC

## 2022-01-06 MED ORDER — DONEPEZIL HCL 10 MG PO TABS: ORAL_TABLET | ORAL | 0 refills | Status: DC

## 2022-01-06 MED ORDER — LEVOCETIRIZINE DIHYDROCHLORIDE 5 MG PO TABS: 5.0000 mg | ORAL_TABLET | Freq: Every evening | ORAL | 1 refills | Status: DC

## 2022-01-06 NOTE — Assessment & Plan Note (Signed)
Secondary to bilateral knee OA, handicap application done today

## 2022-01-06 NOTE — Progress Notes (Signed)
Patient ID: Katherine Stevens, female    DOB: [birth year], 76 y.o.   MRN: [id number]  PCP: Delsa Grana, PA-C  Chief Complaint  Patient presents with   Follow-up   Depression    Would like to start back on wellbutrion    Subjective:   Katherine Stevens is a 76 y.o. female, presents to clinic with CC of the following:  HPI   Here for f/up on hypothyroid Pt's med dose has been gradually and carefully reduced with in office f/up visits.  Her dose changes have been confirmed by her each subsequent visit but TSH remained abnormally low. After last lab results notes and calls to the pt, she stated she was taking a much higher dose from a long time ago. Her last Rx was for 88 mcg She was taking higher doses from prior RX She brings in the levothyroxine bottle today 88 mcg Due for repeat TSH today Mood somewhat depressed - see below Lab Results  Component Value Date   TSH 0.01 (L) 11/19/2021   TSH 0.02 (L) 10/08/2021   TSH 0.01 (L) 07/20/2021   TSH 0.18 (L) 11/20/2020   TSH 0.02 (L) 10/09/2020   TSH 0.03 (L) 05/01/2020   TSH 0.01 (L) 01/31/2020   TSH 5.13 (H) 11/29/2019   TSH 0.01 (L) 10/18/2019    She gives CC today of depression and wants to restart wellbutrin. She was on this BID for smoking cessation and it did help with moods, when she took a second dose too late in the day she had anxiety and insomnia.  She was seen by primary care float provider a few months ago and stated she was no longer taking and it was d/c from med list. W/o wellbutrin she has started to smoke more cigarettes during the day, still well below 1/2 a pack, no smokers cough or DOE  She is still on celexa for MDD/anxiety    01/06/2022    3:01 PM 10/08/2021    2:53 PM 07/20/2021    1:36 PM  Depression screen PHQ 2/9  Decreased Interest 0 0 0  Down, Depressed, Hopeless 0 0 0  PHQ - 2 Score 0 0 0  Altered sleeping 3 0 0  Tired, decreased energy 3 0 0  Change in appetite 0 0 0  Feeling bad or failure  about yourself  0 0 0  Trouble concentrating 0 0 0  Moving slowly or fidgety/restless 0 0 0  Suicidal thoughts 0 0 0  PHQ-9 Score 6 0 0  Difficult doing work/chores Somewhat difficult Not difficult at all Not difficult at all      01/06/2022    3:06 PM 10/08/2021    2:53 PM 07/20/2021    1:36 PM 04/20/2021   11:27 AM  GAD 7 : Generalized Anxiety Score  Nervous, Anxious, on Edge 0 0 0 0  Control/stop worrying 0 0 0 0  Worry too much - different things 0 0 0 0  Trouble relaxing 0 0 0 0  Restless 0 0 0 0  Easily annoyed or irritable 0 0 0 0  Afraid - awful might happen 0 0 0 0  Total GAD 7 Score 0 0 0 0  Anxiety Difficulty Not difficult at all Not difficult at all Not difficult at all Not difficult at all   Alzheimers/dementia, previously seen by [facility] neurology (2020 and 2021 for several issues) Aricept was increased to 10 mg and she has not been seen by them again since early  2021. She drives herself, manages her meds, bills etc independently and would like to remain doing so for as long as possible. She was advised to do [facility] PT and sleep study a few years ago - she declined   She continues to use seroquel 1/2 to 1/4 tablet rarely for sleep   Request handicap application again, she misplaced hers - walks with a cane, cannot walk long distances without stopping  Patient Active Problem List   Diagnosis Date Noted   Vitamin D deficiency 11/20/2020   Anxiety 11/20/2020   Insomnia due to other mental disorder 05/01/2020   OAB (overactive bladder) 05/01/2020   Osteopenia 05/01/2020   History of hepatitis C 05/01/2020   Abnormality of gait and mobility 05/01/2020   Current mild episode of major depressive disorder ([place]) 04/16/2019   Type 2 diabetes mellitus without complication, without long-term current use of insulin ([place]) 04/16/2019   Mixed Alzheimer's and vascular dementia ([place]) 04/16/2019   Folate deficiency 04/16/2019   Vitamin B1 deficiency 04/16/2019   GERD  (gastroesophageal reflux disease) 11/22/2018   Tobacco dependence 12/12/2015   Hyperlipidemia 12/10/2014   Osteoarthritis of both knees 08/01/2012   Seasonal allergies 07/28/2012   Class 1 obesity with serious comorbidity and body mass index (BMI) of 34.0 to 34.9 in adult 07/29/2011   Hypothyroidism 12/20/2008   Hypertension, benign 12/20/2008      Current Outpatient Medications:    atorvastatin (LIPITOR) 40 MG tablet, Take 1 tablet (40 mg total) by mouth at bedtime. For cholesterol, Disp: 90 tablet, Rfl: 3   Cholecalciferol (VITAMIN D3) 50 MCG (2000 UT) capsule, Take 1 capsule (2,000 Units total) by mouth daily., Disp: 90 capsule, Rfl: 3   citalopram (CELEXA) 20 MG tablet, Take 1 tablet (20 mg total) by mouth in the morning., Disp: 90 tablet, Rfl: 3   donepezil (ARICEPT) 10 MG tablet, TAKE 1 TABLET(10 MG) BY MOUTH AT BEDTIME, Disp: 90 tablet, Rfl: 0   levocetirizine (XYZAL) 5 MG tablet, Take 1 tablet (5 mg total) by mouth every evening., Disp: 90 tablet, Rfl: 1   levothyroxine (SYNTHROID) 88 MCG tablet, Take 1 tablet (88 mcg total) by mouth daily before breakfast., Disp: 60 tablet, Rfl: 0   losartan (COZAAR) 50 MG tablet, Take 1 tablet (50 mg total) by mouth every morning. For hypertension, Disp: 90 tablet, Rfl: 3   metFORMIN (GLUCOPHAGE) 500 MG tablet, Take 1 tablet (500 mg total) by mouth daily with breakfast. For well controlled diabetes, Disp: 90 tablet, Rfl: 3   oxybutynin (DITROPAN-XL) 5 MG 24 hr tablet, TAKE 1 TABLET(5 MG) BY MOUTH DAILY FOR OVERACTIVE BLADDER, Disp: 90 tablet, Rfl: 3   pantoprazole (PROTONIX) 20 MG tablet, TAKE 1 TABLET(20 MG) BY MOUTH AT BEDTIME FOR GERD OR ABDOMINAL SYMPTOMS, Disp: 90 tablet, Rfl: 3   QUEtiapine (SEROQUEL) 25 MG tablet, TAKE 1 TABLET(25 MG) BY MOUTH AT BEDTIME AS NEEDED FOR INSOMNIA, Disp: 90 tablet, Rfl: 1   Travoprost, BAK Free, (TRAVATAN) 0.004 % SOLN ophthalmic solution, 1 drop at bedtime., Disp: , Rfl:    Allergies  Allergen Reactions    Lisinopril Anaphylaxis    MOUTH & JAW SWELLING     Social History   Tobacco Use   Smoking status: Every Day    Packs/day: 0.25    Types: Cigarettes   Smokeless tobacco: Never   Tobacco comments:    5 cig a day (2 packs lasts her 8 days) not taking wellbutrin any more  Vaping Use   Vaping Use: Never used  Substance Use Topics   Alcohol use: Not Currently   Drug use: Not Currently      Chart Review Today: I personally reviewed active problem list, medication list, allergies, family history, social history, health maintenance, notes from last encounter, lab results, imaging with the patient/caregiver today.   Review of Systems  Constitutional: Negative.   HENT: Negative.    Eyes: Negative.   Respiratory: Negative.    Cardiovascular: Negative.   Gastrointestinal: Negative.   Endocrine: Negative.   Genitourinary: Negative.   Musculoskeletal: Negative.   Skin: Negative.   Allergic/Immunologic: Negative.   Neurological: Negative.   Hematological: Negative.   Psychiatric/Behavioral: Negative.    All other systems reviewed and are negative.      Objective:   Vitals:   01/06/22 1504  BP: 132/76  Pulse: 91  Resp: 16  Temp: 98.4 F (36.9 C)  TempSrc: Oral  SpO2: 98%  Weight: 238 lb (108 kg)  Height: 5' 6.5" (1.689 m)    Body mass index is 37.84 kg/m.  Physical Exam Vitals reviewed.  Constitutional:      General: She is not in acute distress.    Appearance: Normal appearance. She is obese. She is not ill-appearing, toxic-appearing or diaphoretic.  HENT:     Head: Normocephalic and atraumatic.     Right Ear: External ear normal.     Left Ear: External ear normal.     Nose: Nose normal.  Eyes:     General:        Right eye: No discharge.        Left eye: No discharge.     Conjunctiva/sclera: Conjunctivae normal.  Cardiovascular:     Rate and Rhythm: Normal rate and regular rhythm.     Pulses: Normal pulses.     Heart sounds: Normal heart sounds. No  murmur heard.    No friction rub. No gallop.  Pulmonary:     Effort: Pulmonary effort is normal. No respiratory distress.     Breath sounds: Normal breath sounds. No stridor. No wheezing, rhonchi or rales.  Skin:    General: Skin is warm and dry.     Coloration: Skin is not jaundiced or pale.     Findings: No erythema.  Neurological:     Mental Status: She is alert.     Cranial Nerves: No cranial nerve deficit, dysarthria or facial asymmetry.     Motor: No tremor.     Coordination: Coordination is intact. Coordination normal.     Gait: Gait abnormal (walks with cane).  Psychiatric:        Mood and Affect: Mood normal.        Behavior: Behavior normal.      Results for orders placed or performed in visit on 10/08/21  Hemoglobin A1c  Result Value Ref Range   Hgb A1c MFr Bld 6.6 (H) <5.7 % of total Hgb   Mean Plasma Glucose 143 mg/dL   eAG (mmol/L) 7.9 mmol/L  COMPLETE METABOLIC PANEL WITH GFR  Result Value Ref Range   Glucose, Bld 136 (H) 65 - 99 mg/dL   BUN 10 7 - 25 mg/dL   Creat 0.94 0.60 - 1.00 mg/dL   eGFR 63 > OR = 60 mL/min/1.43m   BUN/Creatinine Ratio NOT APPLICABLE 6 - 22 (calc)   Sodium 144 135 - 146 mmol/L   Potassium 4.1 3.5 - 5.3 mmol/L   Chloride 111 (H) 98 - 110 mmol/L   CO2 23 20 - 32 mmol/L   Calcium  9.0 8.6 - 10.4 mg/dL   Total Protein 7.4 6.1 - 8.1 g/dL   Albumin 3.9 3.6 - 5.1 g/dL   Globulin 3.5 1.9 - 3.7 g/dL (calc)   AG Ratio 1.1 1.0 - 2.5 (calc)   Total Bilirubin 0.4 0.2 - 1.2 mg/dL   Alkaline phosphatase (APISO) 153 37 - 153 U/L   AST 17 10 - 35 U/L   ALT 12 6 - 29 U/L  TSH  Result Value Ref Range   TSH 0.02 (L) 0.40 - 4.50 mIU/L  CBC with Differential/Platelet  Result Value Ref Range   WBC 10.2 3.8 - 10.8 Thousand/uL   RBC 4.20 3.80 - 5.10 Million/uL   Hemoglobin 11.8 11.7 - 15.5 g/dL   HCT 36.5 35.0 - 45.0 %   MCV 86.9 80.0 - 100.0 fL   MCH 28.1 27.0 - 33.0 pg   MCHC 32.3 32.0 - 36.0 g/dL   RDW 13.4 11.0 - 15.0 %   Platelets 274 140  - 400 Thousand/uL   MPV 10.0 7.5 - 12.5 fL   Neutro Abs 5,365 1,500 - 7,800 cells/uL   Lymphs Abs 3,886 850 - 3,900 cells/uL   Absolute Monocytes 745 200 - 950 cells/uL   Eosinophils Absolute 173 15 - 500 cells/uL   Basophils Absolute 31 0 - 200 cells/uL   Neutrophils Relative % 52.6 %   Total Lymphocyte 38.1 %   Monocytes Relative 7.3 %   Eosinophils Relative 1.7 %   Basophils Relative 0.3 %  Urine Microalbumin w/creat. ratio  Result Value Ref Range   Creatinine, Urine 57 20 - 275 mg/dL   Microalb, Ur 0.5 mg/dL   Microalb Creat Ratio 9 <30 mcg/mg creat  Lipid panel  Result Value Ref Range   Cholesterol 84 <200 mg/dL   HDL 48 (L) > OR = 50 mg/dL   Triglycerides 69 <150 mg/dL   LDL Cholesterol (Calc) 21 mg/dL (calc)   Total CHOL/HDL Ratio 1.8 <5.0 (calc)   Non-HDL Cholesterol (Calc) 36 <130 mg/dL (calc)       Assessment & Plan:   Problem List Items Addressed This Visit       Cardiovascular and Mediastinum   Mixed Alzheimer's and vascular dementia (HCC)    On donepezil 10 mg, increased by neurology over 2 years ago and she got lost to f/up, reestablish with neuro, pt wishes to maintain as much independence as possible.      Relevant Medications   buPROPion (WELLBUTRIN) 75 MG tablet   donepezil (ARICEPT) 10 MG tablet (Start on 02/22/2022)   Hypertension, benign    Stable and well controlled on losartan 50 mg daily        Endocrine   Type 2 diabetes mellitus without complication, without long-term current use of insulin ([place])    Has been well controlled on lower dose metformin She has f/up due in Jan for 6 month f/up Lab Results  Component Value Date   HGBA1C 6.6 (H) 10/08/2021        Hypothyroidism - Primary    Lab Results  Component Value Date   TSH 0.01 (L) 11/19/2021  thyroid suppressed, pt was taking too high of a dose and not what was last prescribed Currently on 88 mcg daily, recheck tsh Mood/energy down Dose adjustment pending lab results Discussed  with her working with pharmacist to get pill packs monthly sent to her so that it would eliminate confusion with dose changes.  She is open to the idea but doesn't want to waste  the meds she just refilled       Relevant Orders   TSH     Other   Class 2 severe obesity with serious comorbidity and body mass index (BMI) of 37.0 to 37.9 in adult [facility])    With multiple associated comorbidities, severe bilateral knee OA, HTN, HLD, DM, OSA, hypothyroid       Seasonal allergies   Relevant Medications   levocetirizine (XYZAL) 5 MG tablet   Tobacco dependence    Smoking cessation instruction/counseling given:  counseled patient on the dangers of tobacco use, advised patient to stop smoking, and reviewed strategies to maximize success  Refilled wellbutrin - she was out and noticed she smoked less when taking        Relevant Medications   buPROPion (WELLBUTRIN) 75 MG tablet   Current mild episode of major depressive disorder (HCC)    phq score mildly elevated and worse than last OV Levothyroxine med dose was decreased - this may be some hypothyroid sx? She also has not been taking wellbutrin but wants to - it was d/c several months ago during a visit though this sounds like it may have been a miscommunication  Refill wellbutrin - start with am dose for a week then can add back the lunchtime dose We are checking tsh Keep celexa the same for now       Relevant Medications   buPROPion (WELLBUTRIN) 75 MG tablet   Other Relevant Orders   TSH   Insomnia due to other mental disorder    Sleeping well, she rarely uses seroquel and uses 1/4 to 1/2 tab of 25 mg dose      Abnormality of gait and mobility    Secondary to bilateral knee OA, handicap application done today       Other Visit Diagnoses     Encounter for smoking cessation counseling       see above   Relevant Medications   buPROPion (WELLBUTRIN) 75 MG tablet   Encounter for medication monitoring       Relevant Orders   TSH            Delsa Grana, PA-C 01/06/22 3:08 PM

## 2022-01-06 NOTE — Assessment & Plan Note (Signed)
Has been well controlled on lower dose metformin She has f/up due in Jan for 6 month f/up Lab Results  Component Value Date   HGBA1C 6.6 (H) 10/08/2021

## 2022-01-06 NOTE — Patient Instructions (Addendum)
We will figure out your next appointment when I see the lab results  Please call and get a follow up appointment with Dr. Manuella Ghazi at [facility] Neurology:  Katherine Crofts, MD Consulting Physician (Neurology) Phone: [phone]  [phone] [facility] [place] [address]

## 2022-01-06 NOTE — Assessment & Plan Note (Signed)
Sleeping well, she rarely uses seroquel and uses 1/4 to 1/2 tab of 25 mg dose

## 2022-01-06 NOTE — Assessment & Plan Note (Signed)
With multiple associated comorbidities, severe bilateral knee OA, HTN, HLD, DM, OSA, hypothyroid

## 2022-01-06 NOTE — Assessment & Plan Note (Signed)
Stable and well controlled on losartan 50 mg daily

## 2022-01-06 NOTE — Telephone Encounter (Signed)
Called patient no answer left vm per DPR ok- to inform her prescription is not on current med list. If she is interested she will need an appointment.

## 2022-01-06 NOTE — Assessment & Plan Note (Signed)
On donepezil 10 mg, increased by neurology over 2 years ago and she got lost to f/up, reestablish with neuro, pt wishes to maintain as much independence as possible.

## 2022-01-06 NOTE — Assessment & Plan Note (Signed)
phq score mildly elevated and worse than last OV Levothyroxine med dose was decreased - this may be some hypothyroid sx? She also has not been taking wellbutrin but wants to - it was d/c several months ago during a visit though this sounds like it may have been a miscommunication  Refill wellbutrin - start with am dose for a week then can add back the lunchtime dose We are checking tsh Keep celexa the same for now

## 2022-01-06 NOTE — Assessment & Plan Note (Signed)
Lab Results  Component Value Date   TSH 0.01 (L) 11/19/2021  thyroid suppressed, pt was taking too high of a dose and not what was last prescribed Currently on 88 mcg daily, recheck tsh Mood/energy down Dose adjustment pending lab results Discussed with her working with pharmacist to get pill packs monthly sent to her so that it would eliminate confusion with dose changes.  She is open to the idea but doesn't want to waste the meds she just refilled

## 2022-01-06 NOTE — Assessment & Plan Note (Signed)
Smoking cessation instruction/counseling given:  counseled patient on the dangers of tobacco use, advised patient to stop smoking, and reviewed strategies to maximize success  Refilled wellbutrin - she was out and noticed she smoked less when taking

## 2022-01-07 LAB — TSH: TSH: 0.05 mIU/L — ABNORMAL LOW (ref 0.40–4.50)

## 2022-02-11 ENCOUNTER — Ambulatory Visit (LOCAL_COMMUNITY_HEALTH_CENTER): Payer: Medicare Other

## 2022-02-11 DIAGNOSIS — Z23 Encounter for immunization: Secondary | ICD-10-CM | POA: Diagnosis not present

## 2022-02-11 DIAGNOSIS — Z719 Counseling, unspecified: Secondary | ICD-10-CM

## 2022-02-11 NOTE — Progress Notes (Signed)
  Are you feeling sick today? No   Have you ever received a dose of COVID-19 Vaccine? [employer], [place], [place], [place], Other) Yes  If yes, which vaccine and how many doses?   Moderna, 4   Did you bring the vaccination record card or other documentation?  Yes   Do you have a health condition or are undergoing treatment that makes you moderately or severely immunocompromised? This would include, but not be limited to: cancer, HIV, organ transplant, immunosuppressive therapy/high-dose corticosteroids, or moderate/severe primary immunodeficiency.  No  Have you received COVID-19 vaccine before or during hematopoietic cell transplant (HCT) or CAR-T-cell therapies? No  Have you ever had an allergic reaction to: (This would include a severe allergic reaction or a reaction that caused hives, swelling, or respiratory distress, including wheezing.) A component of a COVID-19 vaccine or a previous dose of COVID-19 vaccine? Yes, Lisinopril-anaphylaxis   Have you ever had an allergic reaction to another vaccine (other thanCOVID-19 vaccine) or an injectable medication? (This would include a severe allergic reaction or a reaction that caused hives, swelling, or respiratory distress, including wheezing.)   No    Do you have a history of any of the following:  Myocarditis or Pericarditis No  Dermal fillers:  No  Multisystem Inflammatory Syndrome (MIS-C or MIS-A)? No  COVID-19 disease within the past 3 months? No  Vaccinated with monkeypox vaccine in the last 4 weeks? No   Eligible, administered Moderna SpikeVax, monitored, tolerated well. M.Elo Marmolejos, LPN.

## 2022-02-22 ENCOUNTER — Other Ambulatory Visit: Payer: Self-pay | Admitting: Family Medicine

## 2022-02-22 DIAGNOSIS — E039 Hypothyroidism, unspecified: Secondary | ICD-10-CM

## 2022-04-15 ENCOUNTER — Ambulatory Visit: Payer: Medicare Other | Admitting: Family Medicine

## 2022-04-16 ENCOUNTER — Other Ambulatory Visit: Payer: Self-pay | Admitting: Physician Assistant

## 2022-04-16 DIAGNOSIS — I10 Essential (primary) hypertension: Secondary | ICD-10-CM

## 2022-04-16 DIAGNOSIS — E119 Type 2 diabetes mellitus without complications: Secondary | ICD-10-CM

## 2022-04-16 NOTE — Telephone Encounter (Signed)
Requested medication (s) are due for refill today: yes  Requested medication (s) are on the active medication list: yes  Last refill:  losartan 04/20/21 #90/3, metformin 05/01/21 #90/3   Future visit scheduled: yes  Notes to clinic:  Unable to refill per protocol due to failed labs, no updated results.    Requested Prescriptions  Pending Prescriptions Disp Refills   losartan (COZAAR) 50 MG tablet [Pharmacy Med Name: LOSARTAN 50MG  TABLETS] 90 tablet 3    Sig: TAKE 1 TABLET(50 MG) BY MOUTH EVERY MORNING FOR HIGH BLOOD PRESSURE     Cardiovascular:  Angiotensin Receptor Blockers Failed - 04/16/2022 10:06 AM      Failed - Cr in normal range and within 180 days    Creat  Date Value Ref Range Status  10/08/2021 0.94 0.60 - 1.00 mg/dL Final   Creatinine, Urine  Date Value Ref Range Status  11/19/2021 57 20 - 275 mg/dL Final         Failed - K in normal range and within 180 days    Potassium  Date Value Ref Range Status  10/08/2021 4.1 3.5 - 5.3 mmol/L Final         Passed - Patient is not pregnant      Passed - Last BP in normal range    BP Readings from Last 1 Encounters:  01/06/22 132/76         Passed - Valid encounter within last 6 months    Recent Outpatient Visits           3 months ago Hypothyroidism, unspecified type   [facility] Delsa Grana, PA-C   6 months ago Hypothyroidism, unspecified type   [facility] Delsa Grana, PA-C   9 months ago Hypothyroidism, unspecified type   [facility] Delsa Grana, PA-C   12 months ago Type 2 diabetes mellitus without complication, without long-term current use of insulin ([place])   [facility] Mecum, [place], PA-C   1 year ago Hypothyroidism, unspecified type   [facility] Delsa Grana, PA-C       Future Appointments             In 1 month Delsa Grana, PA-C [facility]             metFORMIN  (GLUCOPHAGE) 500 MG tablet [Pharmacy Med Name: METFORMIN 500MG  TABLETS] 90 tablet 3    Sig: TAKE 1 TABLET BY MOUTH DAILY WITH BREAKFAST FOR WELL CONTROLLED DIABETES     Endocrinology:  Diabetes - Biguanides Failed - 04/16/2022 10:06 AM      Failed - HBA1C is between 0 and 7.9 and within 180 days    Hgb A1c MFr Bld  Date Value Ref Range Status  10/08/2021 6.6 (H) <5.7 % of total Hgb Final    Comment:    For someone without known diabetes, a hemoglobin A1c value of 6.5% or greater indicates that they may have  diabetes and this should be confirmed with a follow-up  test. . For someone with known diabetes, a value <7% indicates  that their diabetes is well controlled and a value  greater than or equal to 7% indicates suboptimal  control. A1c targets should be individualized based on  duration of diabetes, age, comorbid conditions, and  other considerations. . Currently, no consensus exists regarding use of hemoglobin A1c for diagnosis of diabetes for children. Marland Kitchen  Passed - Cr in normal range and within 360 days    Creat  Date Value Ref Range Status  10/08/2021 0.94 0.60 - 1.00 mg/dL Final   Creatinine, Urine  Date Value Ref Range Status  11/19/2021 57 20 - 275 mg/dL Final         Passed - eGFR in normal range and within 360 days    GFR, Est African American  Date Value Ref Range Status  10/09/2020 68 > OR = 60 mL/min/1.79m2 Final   GFR, Est Non African American  Date Value Ref Range Status  10/09/2020 59 (L) > OR = 60 mL/min/1.62m2 Final   eGFR  Date Value Ref Range Status  10/08/2021 63 > OR = 60 mL/min/1.79m2 Final    Comment:    The eGFR is based on the CKD-EPI 2021 equation. To calculate  the new eGFR from a previous Creatinine or Cystatin C result, go to https://www.kidney.org/professionals/ kdoqi/gfr%5Fcalculator          Passed - B12 Level in normal range and within 720 days    Vitamin B-12  Date Value Ref Range Status  10/09/2020 507 200 - 1,100  pg/mL Final         Passed - Valid encounter within last 6 months    Recent Outpatient Visits           3 months ago Hypothyroidism, unspecified type   [facility] Delsa Grana, PA-C   6 months ago Hypothyroidism, unspecified type   [facility] Delsa Grana, PA-C   9 months ago Hypothyroidism, unspecified type   [facility] Delsa Grana, PA-C   12 months ago Type 2 diabetes mellitus without complication, without long-term current use of insulin ([place])   [facility] Mecum, [place], PA-C   1 year ago Hypothyroidism, unspecified type   [facility] Delsa Grana, PA-C       Future Appointments             In 1 month Delsa Grana, PA-C [facility] [place] within normal limits and completed in the last 12 months    WBC  Date Value Ref Range Status  10/08/2021 10.2 3.8 - 10.8 Thousand/uL Final   RBC  Date Value Ref Range Status  10/08/2021 4.20 3.80 - 5.10 Million/uL Final   Hemoglobin  Date Value Ref Range Status  10/08/2021 11.8 11.7 - 15.5 g/dL Final   HCT  Date Value Ref Range Status  10/08/2021 36.5 35.0 - 45.0 % Final   MCHC  Date Value Ref Range Status  10/08/2021 32.3 32.0 - 36.0 g/dL Final   [facility]  Date Value Ref Range Status  10/08/2021 28.1 27.0 - 33.0 pg Final   MCV  Date Value Ref Range Status  10/08/2021 86.9 80.0 - 100.0 fL Final   No results found for: "PLTCOUNTKUC", "LABPLAT", "POCPLA" RDW  Date Value Ref Range Status  10/08/2021 13.4 11.0 - 15.0 % Final

## 2022-04-19 ENCOUNTER — Telehealth: Payer: Self-pay | Admitting: Physician Assistant

## 2022-04-19 DIAGNOSIS — I10 Essential (primary) hypertension: Secondary | ICD-10-CM

## 2022-04-19 DIAGNOSIS — E119 Type 2 diabetes mellitus without complications: Secondary | ICD-10-CM

## 2022-04-20 NOTE — Telephone Encounter (Signed)
Unable to refill per protocol, Rx request is too soon. Last refill 04/19/22 for 30 days. Will refuse duplicate request.  Requested Prescriptions  Pending Prescriptions Disp Refills   metFORMIN (GLUCOPHAGE) 500 MG tablet [Pharmacy Med Name: METFORMIN 500MG  TABLETS] 90 tablet     Sig: TAKE 1 TABLET(500 MG) BY MOUTH DAILY WITH BREAKFAST     Endocrinology:  Diabetes - Biguanides Failed - 04/19/2022 12:50 PM      Failed - HBA1C is between 0 and 7.9 and within 180 days    Hgb A1c MFr Bld  Date Value Ref Range Status  10/08/2021 6.6 (H) <5.7 % of total Hgb Final    Comment:    For someone without known diabetes, a hemoglobin A1c value of 6.5% or greater indicates that they may have  diabetes and this should be confirmed with a follow-up  test. . For someone with known diabetes, a value <7% indicates  that their diabetes is well controlled and a value  greater than or equal to 7% indicates suboptimal  control. A1c targets should be individualized based on  duration of diabetes, age, comorbid conditions, and  other considerations. . Currently, no consensus exists regarding use of hemoglobin A1c for diagnosis of diabetes for children. .          Passed - Cr in normal range and within 360 days    Creat  Date Value Ref Range Status  10/08/2021 0.94 0.60 - 1.00 mg/dL Final   Creatinine, Urine  Date Value Ref Range Status  11/19/2021 57 20 - 275 mg/dL Final         Passed - eGFR in normal range and within 360 days    GFR, Est African American  Date Value Ref Range Status  10/09/2020 68 > OR = 60 mL/min/1.59m2 Final   GFR, Est Non African American  Date Value Ref Range Status  10/09/2020 59 (L) > OR = 60 mL/min/1.92m2 Final   eGFR  Date Value Ref Range Status  10/08/2021 63 > OR = 60 mL/min/1.77m2 Final    Comment:    The eGFR is based on the CKD-EPI 2021 equation. To calculate  the new eGFR from a previous Creatinine or Cystatin C result, go to  https://www.kidney.org/professionals/ kdoqi/gfr%5Fcalculator          Passed - B12 Level in normal range and within 720 days    Vitamin B-12  Date Value Ref Range Status  10/09/2020 507 200 - 1,100 pg/mL Final         Passed - Valid encounter within last 6 months    Recent Outpatient Visits           3 months ago Hypothyroidism, unspecified type   [facility] Delsa Grana, PA-C   6 months ago Hypothyroidism, unspecified type   [facility] [place], Kristeen Miss, PA-C   9 months ago Hypothyroidism, unspecified type   [facility] Delsa Grana, PA-C   1 year ago Type 2 diabetes mellitus without complication, without long-term current use of insulin [facility])   [place], PA-C   1 year ago Hypothyroidism, unspecified type   [facility] Delsa Grana, PA-C       Future Appointments             In 3 weeks Delsa Grana, PA-C [place] within normal limits and completed  in the last 12 months    WBC  Date Value Ref Range Status  10/08/2021 10.2 3.8 - 10.8 Thousand/uL Final   RBC  Date Value Ref Range Status  10/08/2021 4.20 3.80 - 5.10 Million/uL Final   Hemoglobin  Date Value Ref Range Status  10/08/2021 11.8 11.7 - 15.5 g/dL Final   HCT  Date Value Ref Range Status  10/08/2021 36.5 35.0 - 45.0 % Final   MCHC  Date Value Ref Range Status  10/08/2021 32.3 32.0 - 36.0 g/dL Final   [facility]  Date Value Ref Range Status  10/08/2021 28.1 27.0 - 33.0 pg Final   MCV  Date Value Ref Range Status  10/08/2021 86.9 80.0 - 100.0 fL Final   No results found for: "PLTCOUNTKUC", "LABPLAT", "POCPLA" RDW  Date Value Ref Range Status  10/08/2021 13.4 11.0 - 15.0 % Final          losartan (COZAAR) 50 MG tablet [Pharmacy Med Name: LOSARTAN 50MG  TABLETS] 90 tablet     Sig: TAKE 1 TABLET(50 MG) BY MOUTH DAILY      Cardiovascular:  Angiotensin Receptor Blockers Failed - 04/19/2022 12:50 PM      Failed - Cr in normal range and within 180 days    Creat  Date Value Ref Range Status  10/08/2021 0.94 0.60 - 1.00 mg/dL Final   Creatinine, Urine  Date Value Ref Range Status  11/19/2021 57 20 - 275 mg/dL Final         Failed - K in normal range and within 180 days    Potassium  Date Value Ref Range Status  10/08/2021 4.1 3.5 - 5.3 mmol/L Final         Passed - Patient is not pregnant      Passed - Last BP in normal range    BP Readings from Last 1 Encounters:  01/06/22 132/76         Passed - Valid encounter within last 6 months    Recent Outpatient Visits           3 months ago Hypothyroidism, unspecified type   [facility] Delsa Grana, PA-C   6 months ago Hypothyroidism, unspecified type   [facility] [place], Kristeen Miss, PA-C   9 months ago Hypothyroidism, unspecified type   [facility] Delsa Grana, PA-C   1 year ago Type 2 diabetes mellitus without complication, without long-term current use of insulin [facility])   [facility] [place], Dani Gobble, PA-C   1 year ago Hypothyroidism, unspecified type   [facility] Delsa Grana, PA-C       Future Appointments             In 3 weeks Delsa Grana, PA-C [facility], [facility]

## 2022-05-03 ENCOUNTER — Other Ambulatory Visit: Payer: Self-pay | Admitting: Physician Assistant

## 2022-05-03 DIAGNOSIS — E119 Type 2 diabetes mellitus without complications: Secondary | ICD-10-CM

## 2022-05-03 DIAGNOSIS — E785 Hyperlipidemia, unspecified: Secondary | ICD-10-CM

## 2022-05-03 DIAGNOSIS — K219 Gastro-esophageal reflux disease without esophagitis: Secondary | ICD-10-CM

## 2022-05-03 NOTE — Telephone Encounter (Signed)
Requested Prescriptions  Pending Prescriptions Disp Refills   pantoprazole (PROTONIX) 20 MG tablet [Pharmacy Med Name: PANTOPRAZOLE 20MG  TABLETS] 90 tablet 1    Sig: TAKE 1 TABLET(20 MG) BY MOUTH AT BEDTIME FOR GERD OR ABDOMINAL SYMPTOMS     Gastroenterology: Proton Pump Inhibitors Passed - 05/03/2022  3:20 AM      Passed - Valid encounter within last 12 months    Recent Outpatient Visits           3 months ago Hypothyroidism, unspecified type   [facility] Delsa Grana, PA-C   6 months ago Hypothyroidism, unspecified type   [facility] Delsa Grana, PA-C   9 months ago Hypothyroidism, unspecified type   [facility] Delsa Grana, PA-C   1 year ago Type 2 diabetes mellitus without complication, without long-term current use of insulin ([place])   [place], PA-C   1 year ago Hypothyroidism, unspecified type   [facility] Delsa Grana, PA-C       Future Appointments             In 2 weeks Delsa Grana, PA-C [facility]             atorvastatin (LIPITOR) 40 MG tablet [Pharmacy Med Name: ATORVASTATIN 40MG  TABLETS] 90 tablet 2    Sig: TAKE 1 TABLET(40 MG) BY MOUTH AT BEDTIME FOR CHOLESTEROL     Cardiovascular:  Antilipid - Statins Failed - 05/03/2022  3:20 AM      Failed - Lipid Panel in normal range within the last 12 months    Cholesterol  Date Value Ref Range Status  10/08/2021 84 <200 mg/dL Final   LDL Cholesterol (Calc)  Date Value Ref Range Status  10/08/2021 21 mg/dL (calc) Final    Comment:    Reference range: <100 . Desirable range <100 mg/dL for primary prevention;   <70 mg/dL for patients with CHD or diabetic patients  with > or = 2 CHD risk factors. Marland Kitchen LDL-C is now calculated using the Martin-Hopkins  calculation, which is a validated novel method providing  better accuracy than  the Friedewald equation in the  estimation of LDL-C.  Cresenciano Genre et al. Annamaria Helling. 1610;960(45): 2061-2068  (http://education.[employer].com/faq/FAQ164)    HDL  Date Value Ref Range Status  10/08/2021 48 (L) > OR = 50 mg/dL Final   Triglycerides  Date Value Ref Range Status  10/08/2021 69 <150 mg/dL Final         Passed - Patient is not pregnant      Passed - Valid encounter within last 12 months    Recent Outpatient Visits           3 months ago Hypothyroidism, unspecified type   [facility] Delsa Grana, PA-C   6 months ago Hypothyroidism, unspecified type   [facility] Delsa Grana, PA-C   9 months ago Hypothyroidism, unspecified type   [facility] Delsa Grana, PA-C   1 year ago Type 2 diabetes mellitus without complication, without long-term current use of insulin [facility])   [place], PA-C   1 year ago Hypothyroidism, unspecified type   [facility] Delsa Grana, PA-C       Future Appointments             In 2 weeks Delsa Grana,  PA-C [facility], [facility]

## 2022-05-17 ENCOUNTER — Encounter: Payer: Self-pay | Admitting: Family Medicine

## 2022-05-17 ENCOUNTER — Ambulatory Visit (INDEPENDENT_AMBULATORY_CARE_PROVIDER_SITE_OTHER): Payer: Medicare Other | Admitting: Family Medicine

## 2022-05-17 VITALS — BP 132/80 | HR 82 | Temp 98.0°F | Resp 16 | Ht 66.5 in | Wt 231.1 lb

## 2022-05-17 DIAGNOSIS — N3281 Overactive bladder: Secondary | ICD-10-CM

## 2022-05-17 DIAGNOSIS — G309 Alzheimer's disease, unspecified: Secondary | ICD-10-CM | POA: Diagnosis not present

## 2022-05-17 DIAGNOSIS — E039 Hypothyroidism, unspecified: Secondary | ICD-10-CM | POA: Diagnosis not present

## 2022-05-17 DIAGNOSIS — F17200 Nicotine dependence, unspecified, uncomplicated: Secondary | ICD-10-CM

## 2022-05-17 DIAGNOSIS — J302 Other seasonal allergic rhinitis: Secondary | ICD-10-CM

## 2022-05-17 DIAGNOSIS — E785 Hyperlipidemia, unspecified: Secondary | ICD-10-CM | POA: Diagnosis not present

## 2022-05-17 DIAGNOSIS — E119 Type 2 diabetes mellitus without complications: Secondary | ICD-10-CM

## 2022-05-17 DIAGNOSIS — Z5181 Encounter for therapeutic drug level monitoring: Secondary | ICD-10-CM

## 2022-05-17 DIAGNOSIS — I10 Essential (primary) hypertension: Secondary | ICD-10-CM | POA: Diagnosis not present

## 2022-05-17 DIAGNOSIS — K219 Gastro-esophageal reflux disease without esophagitis: Secondary | ICD-10-CM | POA: Diagnosis not present

## 2022-05-17 DIAGNOSIS — Z6837 Body mass index (BMI) 37.0-37.9, adult: Secondary | ICD-10-CM

## 2022-05-17 DIAGNOSIS — N1831 Chronic kidney disease, stage 3a: Secondary | ICD-10-CM | POA: Insufficient documentation

## 2022-05-17 DIAGNOSIS — F320 Major depressive disorder, single episode, mild: Secondary | ICD-10-CM | POA: Diagnosis not present

## 2022-05-17 DIAGNOSIS — E538 Deficiency of other specified B group vitamins: Secondary | ICD-10-CM | POA: Diagnosis not present

## 2022-05-17 DIAGNOSIS — F0280 Dementia in other diseases classified elsewhere without behavioral disturbance: Secondary | ICD-10-CM

## 2022-05-17 DIAGNOSIS — F0150 Vascular dementia without behavioral disturbance: Secondary | ICD-10-CM

## 2022-05-17 DIAGNOSIS — Z716 Tobacco abuse counseling: Secondary | ICD-10-CM

## 2022-05-17 DIAGNOSIS — E66812 Obesity, class 2: Secondary | ICD-10-CM

## 2022-05-17 MED ORDER — OXYBUTYNIN CHLORIDE ER 5 MG PO TB24: ORAL_TABLET | ORAL | 3 refills | Status: DC

## 2022-05-17 MED ORDER — LEVOTHYROXINE SODIUM 88 MCG PO TABS: ORAL_TABLET | ORAL | 0 refills | Status: DC

## 2022-05-17 MED ORDER — METFORMIN HCL 500 MG PO TABS: 500.0000 mg | ORAL_TABLET | Freq: Every day | ORAL | 1 refills | Status: DC

## 2022-05-17 MED ORDER — LOSARTAN POTASSIUM 50 MG PO TABS: 50.0000 mg | ORAL_TABLET | Freq: Every day | ORAL | 1 refills | Status: DC

## 2022-05-17 NOTE — Assessment & Plan Note (Signed)
Recent dose increased by neurology, but patient unfortunately missed her appointment Encouraged her to reach out to Dr. Greggory Keen clinic to reschedule

## 2022-05-17 NOTE — Assessment & Plan Note (Signed)
Symptoms well controlled with current medications.

## 2022-05-17 NOTE — Assessment & Plan Note (Signed)
Has been well-controlled unfortunately ran out of medications, refills ordered

## 2022-05-17 NOTE — Assessment & Plan Note (Signed)
PHQ-9 negative, mood good today It is unclear if she is taking the Celexa but she is continuing to take Wellbutrin

## 2022-05-17 NOTE — Assessment & Plan Note (Signed)
Blood pressure today fairly well-controlled on losartan 50 mg once daily BP Readings from Last 3 Encounters:  05/17/22 132/80  01/06/22 132/76  10/08/21 126/70

## 2022-05-17 NOTE — Patient Instructions (Addendum)
[facility] [address], [place], [place] [address] Scheduling phone #: [phone]  Call for mammogram and Bone Density test    You missed a follow up appointment with Neurology - please call them to reschedule  Team Member Role and Specialty Contact Info Address Start End Comments  Vladimir Crofts, MD Consulting Physician (Neurology) Phone: [phone] Fax: [phone] [facility] [place] [id number] 01/06/2022 - -

## 2022-05-17 NOTE — Assessment & Plan Note (Signed)
Recheck today. 

## 2022-05-17 NOTE — Assessment & Plan Note (Signed)
Smoking cessation instruction/counseling given:  counseled patient on the dangers of tobacco use, advised patient to stop smoking, and reviewed strategies to maximize success More than 5 minutes spent today discussing smoking

## 2022-05-17 NOTE — Assessment & Plan Note (Signed)
Good statin compliance, no SE or concerns

## 2022-05-17 NOTE — Assessment & Plan Note (Signed)
Well controlled with diet efforts and metformin 500 mg once daily  Recent pertinent labs: Lab Results  Component Value Date   HGBA1C 6.6 (H) 10/08/2021   HGBA1C 6.3 (A) 04/20/2021   HGBA1C 6.1 (H) 10/09/2020   Lab Results  Component Value Date   MICROALBUR 0.5 11/19/2021   [place] 21 10/08/2021   CREATININE 0.94 10/08/2021   Standard of care and health maintenance: Urine Microalbumin:  done Foot exam:  due in July DM eye exam:  UTD ACEI/ARB:  yes- losartan Statin:  yes- lipitor

## 2022-05-17 NOTE — Assessment & Plan Note (Signed)
She did not bring her levothyroxine today - still unclear which dose she has been taking Last Rx was for 88 mcg Recheck TSH today Close f/up with med bottles and to recheck labs Symptomatically euthyroid - moods good, weight stable Lab Results  Component Value Date   TSH 0.05 (L) 01/06/2022   TSH 0.01 (L) 11/19/2021   TSH 0.02 (L) 10/08/2021   TSH 0.01 (L) 07/20/2021   TSH 0.18 (L) 11/20/2020   TSH 0.02 (L) 10/09/2020   TSH 0.03 (L) 05/01/2020   TSH 0.01 (L) 01/31/2020   TSH 5.13 (H) 11/29/2019   TSH 0.01 (L) 10/18/2019

## 2022-05-17 NOTE — Assessment & Plan Note (Signed)
She has lost a little bit of weight with diet and lifestyle efforts BMI now 36 Counseled on diet lifestyle efforts she does have limitations with osteoarthritis

## 2022-05-17 NOTE — Progress Notes (Signed)
Name: Katherine Stevens   MRN: [id number]    DOB: [birth year]   Date:05/17/2022       Progress Note  Chief Complaint  Patient presents with   Follow-up   Diabetes   Hypertension   Hypothyroidism     Subjective:   Katherine Stevens is a 77 y.o. female, presents to clinic for f/up on thyroid Difficulty with med dosing and pt compliance - possibly from memory/cognitive issues   She did attempt to bring all her meds today, but many are duplicate or missing  HTN - has been well controlled on losartan 50 mg once daily  T2DM - well controlled with metformin 500 mg once daily  GERD - using PPI prn and pepcid prn - recently no GI sx  OAB - out of oxybutynin - 5 mg 24 hr pill  Hypothyroid - has been difficult to get TSH normal -  She did not f/up in amount of time previously requested to recheck abnormal TSH and levothyroxine dosing  Dementia/Alzheimer's she has been seeing neurology but recently missed a follow-up appointment they recently increased her Aricept medication  Anxiety/depression on celexa and wellbutrin - mood has been up and down sometimes associated with hypothyroid, othertimes med SE (wellbutrin BID dosing causing anxiety/insomnia)    05/17/2022    1:38 PM 01/06/2022    3:01 PM 10/08/2021    2:53 PM  Depression screen PHQ 2/9  Decreased Interest 0 0 0  Down, Depressed, Hopeless 0 0 0  PHQ - 2 Score 0 0 0  Altered sleeping 0 3 0  Tired, decreased energy 0 3 0  Change in appetite 0 0 0  Feeling bad or failure about yourself  0 0 0  Trouble concentrating 0 0 0  Moving slowly or fidgety/restless 0 0 0  Suicidal thoughts 0 0 0  PHQ-9 Score 0 6 0  Difficult doing work/chores Not difficult at all Somewhat difficult Not difficult at all  She states mood and anxiety are good - she's not sure if she's taking celexa but it taking wellbutrin    01/06/2022    3:06 PM 10/08/2021    2:53 PM 07/20/2021    1:36 PM 04/20/2021   11:27 AM  GAD 7 : Generalized Anxiety Score   Nervous, Anxious, on Edge 0 0 0 0  Control/stop worrying 0 0 0 0  Worry too much - different things 0 0 0 0  Trouble relaxing 0 0 0 0  Restless 0 0 0 0  Easily annoyed or irritable 0 0 0 0  Afraid - awful might happen 0 0 0 0  Total GAD 7 Score 0 0 0 0  Anxiety Difficulty Not difficult at all Not difficult at all Not difficult at all Not difficult at all   Smoking cessation - still smoking only 4-5 cig a day        Current Outpatient Medications:    atorvastatin (LIPITOR) 40 MG tablet, TAKE 1 TABLET(40 MG) BY MOUTH AT BEDTIME FOR CHOLESTEROL, Disp: 90 tablet, Rfl: 2   buPROPion (WELLBUTRIN) 75 MG tablet, Take 1 (75 mg) tab po with breakfast and 1 tab po with lunch -  for moods and smoking cessation, Disp: 180 tablet, Rfl: 1   Cholecalciferol (VITAMIN D3) 50 MCG (2000 UT) capsule, Take 1 capsule (2,000 Units total) by mouth daily., Disp: 90 capsule, Rfl: 3   citalopram (CELEXA) 20 MG tablet, Take 1 tablet (20 mg total) by mouth in the morning., Disp: 90 tablet, Rfl: 3  donepezil (ARICEPT) 10 MG tablet, TAKE 1 TABLET(10 MG) BY MOUTH AT BEDTIME, Disp: 90 tablet, Rfl: 0   levocetirizine (XYZAL) 5 MG tablet, Take 1 tablet (5 mg total) by mouth every evening., Disp: 90 tablet, Rfl: 1   levothyroxine (SYNTHROID) 88 MCG tablet, TAKE 1 TABLET(88 MCG) BY MOUTH DAILY BEFORE BREAKFAST, Disp: 90 tablet, Rfl: 0   losartan (COZAAR) 50 MG tablet, Take 1 tablet (50 mg total) by mouth daily., Disp: 30 tablet, Rfl: 0   metFORMIN (GLUCOPHAGE) 500 MG tablet, Take 1 tablet (500 mg total) by mouth daily with breakfast., Disp: 30 tablet, Rfl: 0   oxybutynin (DITROPAN-XL) 5 MG 24 hr tablet, TAKE 1 TABLET(5 MG) BY MOUTH DAILY FOR OVERACTIVE BLADDER, Disp: 90 tablet, Rfl: 3   pantoprazole (PROTONIX) 20 MG tablet, TAKE 1 TABLET(20 MG) BY MOUTH AT BEDTIME FOR GERD OR ABDOMINAL SYMPTOMS, Disp: 90 tablet, Rfl: 1   QUEtiapine (SEROQUEL) 25 MG tablet, TAKE 1 TABLET(25 MG) BY MOUTH AT BEDTIME AS NEEDED FOR INSOMNIA,  Disp: 90 tablet, Rfl: 1   Travoprost, BAK Free, (TRAVATAN) 0.004 % SOLN ophthalmic solution, 1 drop at bedtime., Disp: , Rfl:   Patient Active Problem List   Diagnosis Date Noted   Vitamin D deficiency 11/20/2020   Insomnia due to other mental disorder 05/01/2020   OAB (overactive bladder) 05/01/2020   Osteopenia 05/01/2020   History of hepatitis C 05/01/2020   Abnormality of gait and mobility 05/01/2020   Current mild episode of major depressive disorder ([place]) 04/16/2019   Type 2 diabetes mellitus without complication, without long-term current use of insulin ([place]) 04/16/2019   Mixed Alzheimer's and vascular dementia ([place]) 04/16/2019   Folate deficiency 04/16/2019   Vitamin B1 deficiency 04/16/2019   GERD (gastroesophageal reflux disease) 11/22/2018   Tobacco dependence 12/12/2015   Hyperlipidemia 12/10/2014   Osteoarthritis of both knees 08/01/2012   Seasonal allergies 07/28/2012   Class 2 severe obesity with serious comorbidity and body mass index (BMI) of 37.0 to 37.9 in adult ([place]) 07/29/2011   Hypothyroidism 12/20/2008   Hypertension, benign 12/20/2008    Past Surgical History:  Procedure Laterality Date   ABDOMINAL HYSTERECTOMY      Family History  Problem Relation Age of Onset   Heart failure Mother    Diabetes Mother    Diabetes Sister    Hyperlipidemia Sister    Thyroid disease Sister    Stroke Brother    Heart attack Brother    Cancer Maternal Grandmother    Heart attack Brother     Social History   Tobacco Use   Smoking status: Every Day    Packs/day: 0.25    Types: Cigarettes   Smokeless tobacco: Never   Tobacco comments:    5 cig a day (2 packs lasts her 8 days) not taking wellbutrin any more  Vaping Use   Vaping Use: Never used  Substance Use Topics   Alcohol use: Not Currently   Drug use: Not Currently     Allergies  Allergen Reactions   Lisinopril Anaphylaxis    MOUTH & JAW SWELLING    Health Maintenance  Topic Date Due    DTaP/Tdap/Td (1 - Tdap) Never done   DEXA SCAN  12/18/2017   MAMMOGRAM  12/09/2021   OPHTHALMOLOGY EXAM  03/18/2022   HEMOGLOBIN A1C  04/10/2022   Medicare Annual Wellness (AWV)  07/10/2022   COVID-19 Vaccine (6 - 2023-24 season) 06/02/2022 (Originally 04/08/2022)   INFLUENZA VACCINE  07/04/2022 (Originally 11/03/2021)   Diabetic kidney evaluation -  eGFR measurement  10/09/2022   FOOT EXAM  10/09/2022   Diabetic kidney evaluation - Urine ACR  11/20/2022   Pneumonia Vaccine 38+ Years old  Completed   HPV VACCINES  Aged Out   Zoster Vaccines- Shingrix  Discontinued    Chart Review Today: I personally reviewed active problem list, medication list, allergies, family history, social history, health maintenance, notes from last encounter, lab results, imaging with the patient/caregiver today.   Review of Systems  Constitutional: Negative.   HENT: Negative.    Eyes: Negative.   Respiratory: Negative.    Cardiovascular: Negative.   Gastrointestinal: Negative.   Endocrine: Negative.   Genitourinary: Negative.   Musculoskeletal: Negative.   Skin: Negative.   Allergic/Immunologic: Negative.   Neurological: Negative.   Hematological: Negative.   Psychiatric/Behavioral: Negative.    All other systems reviewed and are negative.    Objective:   Vitals:   05/17/22 1338  BP: 132/80  Pulse: 82  Resp: 16  Temp: 98 F (36.7 C)  TempSrc: Oral  SpO2: 97%  Weight: 231 lb 1.6 oz (104.8 kg)  Height: 5' 6.5" (1.689 m)    Body mass index is 36.74 kg/m.  Physical Exam Vitals and nursing note reviewed.  Constitutional:      General: She is not in acute distress.    Appearance: Normal appearance. She is obese. She is not ill-appearing, toxic-appearing or diaphoretic.  HENT:     Head: Normocephalic and atraumatic.     Right Ear: External ear normal.     Left Ear: External ear normal.  Eyes:     General: No scleral icterus.       Right eye: No discharge.        Left eye: No discharge.      Conjunctiva/sclera: Conjunctivae normal.  Cardiovascular:     Rate and Rhythm: Normal rate and regular rhythm.     Pulses: Normal pulses.     Heart sounds: Normal heart sounds.  Pulmonary:     Effort: Pulmonary effort is normal.     Breath sounds: Normal breath sounds.  Skin:    General: Skin is warm and dry.     Coloration: Skin is not jaundiced or pale.     Findings: No lesion.  Neurological:     Mental Status: She is alert.     Gait: Gait abnormal.  Psychiatric:        Attention and Perception: Attention normal.        Mood and Affect: Mood and affect normal.        Speech: Speech normal.        Behavior: Behavior normal. Behavior is cooperative.        Cognition and Memory: Memory is impaired.        Assessment & Plan:   Problem List Items Addressed This Visit       Cardiovascular and Mediastinum   Mixed Alzheimer's and vascular dementia ([place])    Recent dose increased by neurology, but patient unfortunately missed her appointment Encouraged her to reach out to Dr. Greggory Keen clinic to reschedule      F/up info for Dr. Manuella Ghazi given      Hypertension, benign    Blood pressure today fairly well-controlled on losartan 50 mg once daily BP Readings from Last 3 Encounters:  05/17/22 132/80  01/06/22 132/76  10/08/21 126/70        Relevant Medications   losartan (COZAAR) 50 MG tablet   Other Relevant Orders   COMPLETE METABOLIC  PANEL WITH GFR     Digestive   GERD (gastroesophageal reflux disease)    Symptoms currently well-controlled, using Pepcid as needed and not using Protonix very often Continue diet/lifestyle efforts and meds prn        Endocrine   Type 2 diabetes mellitus without complication, without long-term current use of insulin ([place]) - Primary    Well controlled with diet efforts and metformin 500 mg once daily  Recent pertinent labs: Lab Results  Component Value Date   HGBA1C 6.6 (H) 10/08/2021   HGBA1C 6.3 (A) 04/20/2021   HGBA1C 6.1  (H) 10/09/2020   Lab Results  Component Value Date   MICROALBUR 0.5 11/19/2021   [place] 21 10/08/2021   CREATININE 0.94 10/08/2021   Standard of care and health maintenance: Urine Microalbumin:  done Foot exam:  due in July DM eye exam:  UTD ACEI/ARB:  yes- losartan Statin:  yes- lipitor      Relevant Medications   losartan (COZAAR) 50 MG tablet   Other Relevant Orders   COMPLETE METABOLIC PANEL WITH GFR, lipid   Hemoglobin A1c   Hypothyroidism    She did not bring her levothyroxine today - still unclear which dose she has been taking Last Rx was for 88 mcg Recheck TSH today Close f/up with med bottles and to recheck labs Symptomatically euthyroid - moods good, weight stable Lab Results  Component Value Date   TSH 0.05 (L) 01/06/2022   TSH 0.01 (L) 11/19/2021   TSH 0.02 (L) 10/08/2021   TSH 0.01 (L) 07/20/2021   TSH 0.18 (L) 11/20/2020   TSH 0.02 (L) 10/09/2020   TSH 0.03 (L) 05/01/2020   TSH 0.01 (L) 01/31/2020   TSH 5.13 (H) 11/29/2019   TSH 0.01 (L) 10/18/2019        Relevant Medications   levothyroxine (SYNTHROID) 88 MCG tablet   Other Relevant Orders   TSH     Genitourinary   OAB (overactive bladder)    Has been well-controlled unfortunately ran out of medications, refills ordered      Relevant Medications   oxybutynin (DITROPAN-XL) 5 MG 24 hr tablet   Stage 3a chronic kidney disease ([place])    Monitoring renal function      Relevant Orders   COMPLETE METABOLIC PANEL WITH GFR     Other   Class 2 severe obesity with serious comorbidity and body mass index (BMI) of 37.0 to 37.9 in adult [facility])    She has lost a little bit of weight with diet and lifestyle efforts BMI now 28 Counseled on diet lifestyle efforts she does have limitations with osteoarthritis      Relevant Orders   COMPLETE METABOLIC PANEL WITH GFR   Seasonal allergies    Symptoms well-controlled with current medications      Tobacco dependence    Smoking cessation  instruction/counseling given:  counseled patient on the dangers of tobacco use, advised patient to stop smoking, and reviewed strategies to maximize success More than 5 minutes spent today discussing smoking       Relevant Orders   CBC with Differential/Platelet   Current mild episode of major depressive disorder ([facility])    PHQ-9 negative, mood good today It is unclear if she is taking the Celexa but she is continuing to take Wellbutrin      Relevant Orders   TSH   Hyperlipidemia    Good statin compliance, no SE or concerns      Relevant Medications   losartan (COZAAR) 50  MG tablet   Other Relevant Orders   COMPLETE METABOLIC PANEL WITH GFR   Lipid panel   B12 deficiency    Recheck today - last done by neuro- reviewed in care everywhere - ~300 With worsening memory/cognition recheck - other lab per neuro- they were previously in normal range      Relevant Orders   CBC with Differential/Platelet   Vitamin B12   Other Visit Diagnoses     Encounter for medication monitoring       Relevant Orders   TSH   COMPLETE METABOLIC PANEL WITH GFR   Lipid panel   Hemoglobin A1c   CBC with Differential/Platelet   Vitamin B12   Encounter for smoking cessation counseling       see above        Return for 2 M thyroid/med follow up in office .   Delsa Grana, PA-C 05/17/22 1:48 PM

## 2022-05-17 NOTE — Assessment & Plan Note (Signed)
Monitoring renal function

## 2022-05-17 NOTE — Assessment & Plan Note (Signed)
Symptoms currently well-controlled, using Pepcid as needed and not using Protonix very often

## 2022-05-18 ENCOUNTER — Other Ambulatory Visit: Payer: Self-pay | Admitting: Family Medicine

## 2022-05-18 DIAGNOSIS — E538 Deficiency of other specified B group vitamins: Secondary | ICD-10-CM

## 2022-05-18 LAB — CBC WITH DIFFERENTIAL/PLATELET
Absolute Monocytes: 756 cells/uL (ref 200–950)
Basophils Absolute: 65 cells/uL (ref 0–200)
Basophils Relative: 0.6 %
Eosinophils Absolute: 151 cells/uL (ref 15–500)
Eosinophils Relative: 1.4 %
HCT: 37.2 % (ref 35.0–45.0)
Hemoglobin: 12.4 g/dL (ref 11.7–15.5)
Lymphs Abs: 4482 cells/uL — ABNORMAL HIGH (ref 850–3900)
MCH: 29 pg (ref 27.0–33.0)
MCHC: 33.3 g/dL (ref 32.0–36.0)
MCV: 86.9 fL (ref 80.0–100.0)
MPV: 10.4 fL (ref 7.5–12.5)
Monocytes Relative: 7 %
Neutro Abs: 5346 cells/uL (ref 1500–7800)
Neutrophils Relative %: 49.5 %
Platelets: 251 10*3/uL (ref 140–400)
RBC: 4.28 10*6/uL (ref 3.80–5.10)
RDW: 14.1 % (ref 11.0–15.0)
Total Lymphocyte: 41.5 %
WBC: 10.8 10*3/uL (ref 3.8–10.8)

## 2022-05-18 LAB — VITAMIN B12: Vitamin B-12: 383 pg/mL (ref 200–1100)

## 2022-05-18 LAB — COMPLETE METABOLIC PANEL WITH GFR
AG Ratio: 1.1 (calc) (ref 1.0–2.5)
ALT: 10 U/L (ref 6–29)
AST: 17 U/L (ref 10–35)
Albumin: 4.1 g/dL (ref 3.6–5.1)
Alkaline phosphatase (APISO): 148 U/L (ref 37–153)
BUN: 12 mg/dL (ref 7–25)
CO2: 25 mmol/L (ref 20–32)
Calcium: 8.9 mg/dL (ref 8.6–10.4)
Chloride: 106 mmol/L (ref 98–110)
Creat: 0.96 mg/dL (ref 0.60–1.00)
Globulin: 3.8 g/dL (calc) — ABNORMAL HIGH (ref 1.9–3.7)
Glucose, Bld: 74 mg/dL (ref 65–99)
Potassium: 4.2 mmol/L (ref 3.5–5.3)
Sodium: 140 mmol/L (ref 135–146)
Total Bilirubin: 0.3 mg/dL (ref 0.2–1.2)
Total Protein: 7.9 g/dL (ref 6.1–8.1)
eGFR: 61 mL/min/{1.73_m2} (ref >=60)

## 2022-05-18 LAB — TSH: TSH: 0.67 mIU/L (ref 0.40–4.50)

## 2022-05-18 LAB — LIPID PANEL
Cholesterol: 101 mg/dL (ref <200)
HDL: 53 mg/dL (ref >=50)
LDL Cholesterol (Calc): 35 mg/dL (calc)
Non-HDL Cholesterol (Calc): 48 mg/dL (calc) (ref <130)
Total CHOL/HDL Ratio: 1.9 (calc) (ref <5.0)
Triglycerides: 54 mg/dL (ref <150)

## 2022-05-18 LAB — HEMOGLOBIN A1C
Hgb A1c MFr Bld: 6.6 % of total Hgb — ABNORMAL HIGH (ref <5.7)
Mean Plasma Glucose: 143 mg/dL
eAG (mmol/L): 7.9 mmol/L

## 2022-05-18 MED ORDER — CYANOCOBALAMIN 500 MCG PO TABS: 500.0000 ug | ORAL_TABLET | Freq: Every day | ORAL | 1 refills | Status: DC

## 2022-05-22 ENCOUNTER — Other Ambulatory Visit: Payer: Self-pay | Admitting: Physician Assistant

## 2022-05-22 ENCOUNTER — Other Ambulatory Visit: Payer: Self-pay | Admitting: Family Medicine

## 2022-05-22 DIAGNOSIS — E039 Hypothyroidism, unspecified: Secondary | ICD-10-CM

## 2022-05-22 DIAGNOSIS — I10 Essential (primary) hypertension: Secondary | ICD-10-CM

## 2022-05-24 NOTE — Telephone Encounter (Signed)
Requested Prescriptions  Refused Prescriptions Disp Refills   losartan (COZAAR) 50 MG tablet [Pharmacy Med Name: LOSARTAN 50MG TABLETS] 90 tablet 1    Sig: TAKE 1 TABLET(50 MG) BY MOUTH DAILY     Cardiovascular:  Angiotensin Receptor Blockers Passed - 05/22/2022  1:32 PM      Passed - Cr in normal range and within 180 days    Creat  Date Value Ref Range Status  05/17/2022 0.96 0.60 - 1.00 mg/dL Final   Creatinine, Urine  Date Value Ref Range Status  11/19/2021 57 20 - 275 mg/dL Final         Passed - K in normal range and within 180 days    Potassium  Date Value Ref Range Status  05/17/2022 4.2 3.5 - 5.3 mmol/L Final         Passed - Patient is not pregnant      Passed - Last BP in normal range    BP Readings from Last 1 Encounters:  05/17/22 132/80         Passed - Valid encounter within last 6 months    Recent Outpatient Visits           1 week ago Type 2 diabetes mellitus without complication, without long-term current use of insulin [facility])   [facility] Delsa Grana, PA-C   4 months ago Hypothyroidism, unspecified type   [facility] Delsa Grana, PA-C   7 months ago Hypothyroidism, unspecified type   [facility] Delsa Grana, PA-C   10 months ago Hypothyroidism, unspecified type   [facility] Delsa Grana, PA-C   1 year ago Type 2 diabetes mellitus without complication, without long-term current use of insulin ([place])   [place], PA-C       Future Appointments             In 1 month Delsa Grana, [facility], [facility]

## 2022-06-14 ENCOUNTER — Other Ambulatory Visit: Payer: Self-pay | Admitting: Physician Assistant

## 2022-06-14 DIAGNOSIS — J302 Other seasonal allergic rhinitis: Secondary | ICD-10-CM

## 2022-06-15 NOTE — Telephone Encounter (Signed)
Called pt no answer, PCP gave her a different Allergy medicine called Levocetirizine.

## 2022-06-15 NOTE — Telephone Encounter (Signed)
Patient called in to f/u on refill request for Loratadine 10 MG but it was denied due to discontinued 07/20/21. Please f/u with patient.

## 2022-06-15 NOTE — Telephone Encounter (Signed)
Refused refill request for Claritin 10 mg because it was discontinued 07/20/2021.

## 2022-06-28 ENCOUNTER — Telehealth: Payer: Self-pay | Admitting: Family Medicine

## 2022-06-28 DIAGNOSIS — R413 Other amnesia: Secondary | ICD-10-CM

## 2022-06-28 DIAGNOSIS — I10 Essential (primary) hypertension: Secondary | ICD-10-CM

## 2022-06-28 DIAGNOSIS — E119 Type 2 diabetes mellitus without complications: Secondary | ICD-10-CM

## 2022-06-28 DIAGNOSIS — E039 Hypothyroidism, unspecified: Secondary | ICD-10-CM

## 2022-06-28 DIAGNOSIS — F0150 Vascular dementia without behavioral disturbance: Secondary | ICD-10-CM

## 2022-06-28 NOTE — Telephone Encounter (Signed)
An appointment for this?

## 2022-06-28 NOTE — Telephone Encounter (Signed)
FYI  I'll speak to him and see what he wants from you but would you be willing to?

## 2022-06-28 NOTE — Telephone Encounter (Signed)
Pt son has came in and talked with the cost center and they have sent a message saying that the attorney has said that it would be best for him to come to the patients provider and get a letter stating that he is the one that is her care giver and brings her to all her appointments and is her only care giver for her. She is a stoke patient and that she also has some dementia. That needs to be in the letter also. His name is Cassiopeia Llanez and his number is [phone].

## 2022-06-28 NOTE — Telephone Encounter (Signed)
I can try - but he has to be on DPR for me to tell the son anything about pt he is able to tell me anything he wants about the pt w/o DPR

## 2022-06-28 NOTE — Telephone Encounter (Signed)
Copied from [place] [id number]. Topic: General - Other >> Jun 28, 2022 11:41 AM Oley Balm A wrote: Reason for CRM: Pt states that he son Sevanna Trisdale is wanting to stop by today to speak with Delsa Grana regarding pt. Pt states that it will not take long if she could please take the time to speak with her son.  Please advise.

## 2022-06-28 NOTE — Telephone Encounter (Signed)
Katherine Stevens is on pt DPR from 2021

## 2022-06-30 ENCOUNTER — Telehealth: Payer: Self-pay

## 2022-06-30 NOTE — Progress Notes (Signed)
  Chronic Care Management   Note  06/30/2022 Name: [name] MRN: [id number] DOB: 21-Nov-1945  Katherine Stevens is a 77 y.o. year old female who is a primary care patient of Delsa Grana, [place]. I reached out to Burna Cash by phone today in response to a referral sent by Ms. Enid Derry Berrian's PCP.  Ms. Mar was given information about Chronic Care Management services today including:  CCM service includes personalized support from designated clinical staff supervised by the physician, including individualized plan of care and coordination with other care providers 24/7 contact phone numbers for assistance for urgent and routine care needs. Service will only be billed when office clinical staff spend 20 minutes or more in a month to coordinate care. Only one practitioner may furnish and bill the service in a calendar month. The patient may stop CCM services at amy time (effective at the end of the month) by phone call to the office staff. The patient will be responsible for cost sharing (co-pay) or up to 20% of the service fee (after annual deductible is met)  Ms. Burna Cash  agreedto scheduling an appointment with the CCM RN Case Manager   Follow up plan: Patient agreed to scheduled appointment with RN Case Manager on 07/12/2022(date/time).   Noreene Larsson, [place], [place] [address] Direct Dial: [phone] [email or web address]

## 2022-06-30 NOTE — Progress Notes (Signed)
  Chronic Care Management   Note  06/30/2022 Name: Tianah Vanhouten MRN: [id number] DOB: [birth year]  Jenita Girven is a 77 y.o. year old female who is a primary care patient of Delsa Grana, [place]. I reached out to Burna Cash by phone today in response to a referral sent by Ms. Enid Derry Delis's PCP.  The first contact attempt was unsuccessful.   Follow up plan: Additional outreach attempts will be made.  Noreene Larsson, [place], [place] [address] Direct Dial: [phone] [email or web address]

## 2022-06-30 NOTE — Telephone Encounter (Signed)
Jeneen Rinks (son) stopped by this morning to check status on this letter. Stated that he need the letter before next week (he has court). Please advise and/or give him a call to inform him the status of the request.

## 2022-06-30 NOTE — Telephone Encounter (Signed)
FYI

## 2022-07-01 ENCOUNTER — Telehealth: Payer: Self-pay

## 2022-07-01 NOTE — Telephone Encounter (Signed)
Per previous telephone encounter Chrystal states you should be able to write this letter per son and pt requesting. Pt was calling back today since pt son needs this letter soon for his court. Please advice   Copied from [place] [id number]. Topic: General - Inquiry >> Jul 01, 2022  1:46 PM Rosanne Ashing P wrote: Reason for CRM: pt would like to have Leisa's nurse to call her back.  She said she needs a letter written but would not go into detail.  CB#  [phone]

## 2022-07-01 NOTE — Telephone Encounter (Signed)
Copied from [place] [id number]. Topic: General - Inquiry >> Jul 01, 2022  1:46 PM Rosanne Ashing P wrote: Reason for CRM: pt would like to have Leisa's nurse to call her back.  She said she needs a letter written but would not go into detail.  CB#  [phone]

## 2022-07-02 ENCOUNTER — Telehealth: Payer: Self-pay | Admitting: *Deleted

## 2022-07-02 NOTE — Patient Outreach (Signed)
  Care Coordination   Initial Visit Note   07/02/2022 Name: Jermya Siefring MRN: [id number] DOB: [birth year]  Kanda Elrick is a 77 y.o. year old female who sees Delsa Grana, [place] for primary care. I spoke with  Burna Cash  and her son Jeneen Rinks by phone today.  What matters to the patients health and wellness today?  Patient's son would like a letter for court confirming that he is  patient's primary caregiver.    Goals Addressed             This Visit's Progress    per son "I need a letter for court stating that my mother needs me for her care"       Care Coordination Interventions: Reviewed request for provider letter confirming that he is patient's primary caregiver Discussed provider limitations regarding this request Collaborated with provider office regarding fulfilling request Patient's son to discuss additional alternatives with is attorney        SDOH assessments and interventions completed:  Yes  SDOH Interventions Today    Flowsheet Row Most Recent Value  SDOH Interventions   Food Insecurity Interventions Intervention Not Indicated  Housing Interventions Intervention Not Indicated  Transportation Interventions Intervention Not Indicated        Care Coordination Interventions:  Yes, provided  Interventions Today    Flowsheet Row Most Recent Value  Chronic Disease   Chronic disease during today's visit Other  [dementia]  General Interventions   General Interventions Discussed/Reviewed Doctor Visits  [Provider appointments reviewed, son to provide transporation, adherance encouraged]  Communication with --  [communicaton with patient's son regarding documentation needed for court]       Follow up plan: Follow up call scheduled for 07/14/22 to assess for any additional community resource needs    Encounter Outcome:  Pt. Visit Completed

## 2022-07-02 NOTE — Telephone Encounter (Signed)
Appt sch'd with Junie Panning for 4.1.2024

## 2022-07-02 NOTE — Patient Instructions (Signed)
Visit Information  Thank you for taking time to visit with me today. Please don't hesitate to contact me if I can be of assistance to you.   Following are the goals we discussed today:   Goals Addressed             This Visit's Progress    per son "I need a letter for court stating that my mother needs me for her care"       Care Coordination Interventions: Reviewed request for provider letter confirming that he is patient's primary caregiver Discussed provider limitations regarding this request Collaborated with provider office regarding fulfilling request Patient's son to discuss additional alternatives with is attorney        Our next appointment is by telephone on 07/14/22 at 10am   Please call the care guide team at [phone] if you need to cancel or reschedule your appointment.   If you are experiencing a Mental Health or [place] or need someone to talk to, please call 911   Patient verbalizes understanding of instructions and care plan provided today and agrees to view in [place]. Active MyChart status and patient understanding of how to access instructions and care plan via MyChart confirmed with patient.     Telephone follow up appointment with care management team member scheduled for: 07/14/22   Elliot Gurney, [place] Worker  [facility] Care Management [phone]

## 2022-07-02 NOTE — Telephone Encounter (Signed)
They need to be seen

## 2022-07-05 ENCOUNTER — Ambulatory Visit
Admission: RE | Admit: 2022-07-05 | Discharge: 2022-07-05 | Disposition: A | Discharge status: Home / Self Care | Payer: Medicare Other | Source: Ambulatory Visit | Attending: Family Medicine | Admitting: Family Medicine

## 2022-07-05 DIAGNOSIS — Z1231 Encounter for screening mammogram for malignant neoplasm of breast: Secondary | ICD-10-CM

## 2022-07-05 DIAGNOSIS — Z780 Asymptomatic menopausal state: Secondary | ICD-10-CM | POA: Diagnosis not present

## 2022-07-05 DIAGNOSIS — M85851 Other specified disorders of bone density and structure, right thigh: Secondary | ICD-10-CM | POA: Diagnosis not present

## 2022-07-06 ENCOUNTER — Encounter: Payer: Self-pay | Admitting: Physician Assistant

## 2022-07-06 ENCOUNTER — Ambulatory Visit (INDEPENDENT_AMBULATORY_CARE_PROVIDER_SITE_OTHER): Payer: Medicare Other | Admitting: Physician Assistant

## 2022-07-06 VITALS — BP 130/66 | HR 96 | Temp 97.4°F | Resp 16 | Ht 66.5 in | Wt 232.2 lb

## 2022-07-06 DIAGNOSIS — Z7189 Other specified counseling: Secondary | ICD-10-CM

## 2022-07-06 NOTE — Progress Notes (Signed)
Acute Office Visit   Patient: Katherine Stevens   DOB: Nov 18, 1945   77 y.o. Female  MRN: [id number] Visit Date: 07/06/2022  Today's healthcare provider: Dani Gobble Miryah Ralls, PA-C  Introduced myself to the patient as a Journalist, newspaper and provided education on APPs in clinical practice.    Chief Complaint  Patient presents with   Form Completion    Pt son requesting a letter but pt not here today. I told pt son needs to be here present   Subjective    HPI HPI     Form Completion    Additional comments: Pt son requesting a letter but pt not here today. I told pt son needs to be here present      Last edited by Salomon Fick, CMA on 07/06/2022 11:15 AM.       Patient arrived at office and informed staff that she drove herself and her son was not with her She stated "I was careful and took my time, I didn't go on the highway"   We informed her that her son is requesting a letter regarding his involvement in her life and care- this was main reason for scheduling today's apt   Chart review demonstrates several attempts to have such a letter completed but son has been made aware that this office does not typically provide this without POA documentation and family members being documented as primary caregivers/guardians  I spoke with Environmental education officer regarding this requests and we can provide a letter stating he helps with transportation as he has dropped patient off at office in the past   Patient has called son several times to relay the importance of him being present today to discuss the letter and explain our limitations regarding this.    Medications: Outpatient Medications Prior to Visit  Medication Sig   atorvastatin (LIPITOR) 40 MG tablet TAKE 1 TABLET(40 MG) BY MOUTH AT BEDTIME FOR CHOLESTEROL   buPROPion (WELLBUTRIN) 75 MG tablet Take 1 (75 mg) tab po with breakfast and 1 tab po with lunch -  for moods and smoking cessation   Cholecalciferol (VITAMIN D3) 50 MCG  (2000 UT) capsule Take 1 capsule (2,000 Units total) by mouth daily.   citalopram (CELEXA) 20 MG tablet Take 1 tablet (20 mg total) by mouth in the morning.   cyanocobalamin (VITAMIN B12) 500 MCG tablet Take 1 tablet (500 mcg total) by mouth daily.   donepezil (ARICEPT) 10 MG tablet TAKE 1 TABLET(10 MG) BY MOUTH AT BEDTIME   levocetirizine (XYZAL) 5 MG tablet Take 1 tablet (5 mg total) by mouth every evening.   levothyroxine (SYNTHROID) 88 MCG tablet TAKE 1 TABLET(88 MCG) BY MOUTH DAILY BEFORE BREAKFAST - for thyroid   losartan (COZAAR) 50 MG tablet Take 1 tablet (50 mg total) by mouth daily. For blood pressure   metFORMIN (GLUCOPHAGE) 500 MG tablet Take 1 tablet (500 mg total) by mouth daily with breakfast.   oxybutynin (DITROPAN-XL) 5 MG 24 hr tablet TAKE 1 TABLET(5 MG) BY MOUTH DAILY FOR OVERACTIVE BLADDER   pantoprazole (PROTONIX) 20 MG tablet TAKE 1 TABLET(20 MG) BY MOUTH AT BEDTIME FOR GERD OR ABDOMINAL SYMPTOMS   QUEtiapine (SEROQUEL) 25 MG tablet TAKE 1 TABLET(25 MG) BY MOUTH AT BEDTIME AS NEEDED FOR INSOMNIA   Travoprost, BAK Free, (TRAVATAN) 0.004 % SOLN ophthalmic solution 1 drop at bedtime.   No facility-administered medications prior to visit.    Review of Systems     Objective  BP 130/66   Pulse 96   Temp (!) 97.4 F (36.3 C) (Oral)   Resp 16   Ht 5' 6.5" (1.689 m)   Wt 232 lb 3.2 oz (105.3 kg)   SpO2 99%   BMI 36.92 kg/m    Physical Exam Vitals reviewed.  Constitutional:      General: She is awake.     Appearance: Normal appearance. She is well-developed and well-groomed.  HENT:     Head: Normocephalic and atraumatic.  Neurological:     General: No focal deficit present.     Mental Status: She is alert. Mental status is at baseline.  Psychiatric:        Mood and Affect: Mood normal.        Behavior: Behavior normal. Behavior is cooperative.       No results found for any visits on 07/06/22.  Assessment & Plan      No follow-ups on file.        Problem List Items Addressed This Visit   None    No follow-ups on file.   I, Rogelio Waynick E Madaleine Simmon, PA-C, have reviewed all documentation for this visit. The documentation on 07/06/22 for the exam, diagnosis, procedures, and orders are all accurate and complete.   Talitha Givens, MHS, PA-C [facility]

## 2022-07-07 ENCOUNTER — Telehealth: Payer: Self-pay | Admitting: Family Medicine

## 2022-07-07 NOTE — Telephone Encounter (Signed)
Contacted Katherine Stevens to schedule their annual wellness visit. Appointment made for 07/23/2022.  [place] Direct Dial: [phone]

## 2022-07-12 ENCOUNTER — Ambulatory Visit (INDEPENDENT_AMBULATORY_CARE_PROVIDER_SITE_OTHER): Payer: Medicare Other

## 2022-07-12 DIAGNOSIS — E119 Type 2 diabetes mellitus without complications: Secondary | ICD-10-CM

## 2022-07-12 DIAGNOSIS — I10 Essential (primary) hypertension: Secondary | ICD-10-CM

## 2022-07-12 NOTE — Chronic Care Management (AMB) (Signed)
Chronic Care Management   CCM RN Visit Note   Name: Katherine Stevens MRN: [id number] DOB: [birth year]  Subjective: Katherine Stevens is a 77 y.o. year old female who is a primary care patient of Danelle Berry, [place]. The patient was referred to the Chronic Care Management team for assistance with care management needs subsequent to provider initiation of CCM services and plan of care.    Today's Visit:  Engaged with patient by telephone for initial visit.     SDOH Interventions Today    Flowsheet Row Most Recent Value  SDOH Interventions   Food Insecurity Interventions Intervention Not Indicated  Housing Interventions Intervention Not Indicated  Transportation Interventions Intervention Not Indicated  Utilities Interventions Intervention Not Indicated  Alcohol Usage Interventions Intervention Not Indicated (Score <7)  Financial Strain Interventions Intervention Not Indicated  Physical Activity Interventions Other (Comments)  [Currently not engaged in physical activites. Plans to reengage in low impact walks when weather is warmer]  Stress Interventions Other (Comment)  [Declines current need for additional counseling]  Social Connections Interventions Other (Comment)  [Interested in [employer] activities]         Goals Addressed             This Visit's Progress    Goal: CCM (Diabetes) Expected Outcome:  Monitor, Self-Manage And Reduce Symptoms of Diabetes       Current Barriers:  Chronic Disease Management support and education needs related to Diabetes  Planned Interventions: Reviewed provider's plan for diabetes management.  Reviewed medication. Reports taking metformin as prescribed. Denies current concerns regarding medication management.  Discussed importance of blood glucose readings. Reports not monitoring daily. Only monitoring fasting levels once a week. Advised to monitor levels daily and record readings.  Provided information regarding hypoglycemia and  hyperglycemia along with appropriate interventions. Denies recent hypoglycemic or hyperglycemic episodes. Discussed nutritional intake and importance of complying with a diabetic diet. Attempting to adhere to recommended diet and monitoring intake of added sugars. Reports increasing intake of fruits, vegetables and lean meats. Reports eating dumplings regularly which tend to have more carbs. Thorough discussion regarding low carb options. Encouraged to notify the team if additional referral for nutrition/dietician is needed. Discussed importance of completing recommended DM preventive care. Reports completing daily foot care as advised. Reports eye is exam is up to date. Reports pending an update eye exam within the next few months.   Discussed importance of completing ordered labs as prescribed.  Assessed social determinant of health barriers.    Lab Results  Component Value Date   HGBA1C 6.6 (H) 05/17/2022      Symptom Management: Attend all scheduled provider appointments Continue taking metformin as prescribed Attend eye exam as scheduled Monitor blood glucose and maintain a log Check feet daily for cuts, sores or redness Wash and dry feet carefully every day Wear comfortable, cotton socks Wear comfortable, well-fitting shoes Read food labels for fat, fiber, carbohydrates and portion size Call provider office for new concerns or questions     Follow Up Plan:  Will follow up for update in three months.     Goal: CCM (Hypertension) Expected Outcome:  Monitor, Self-Manage And Reduce Symptoms of Hypertension       Current Barriers:  Chronic Disease Management support and education needs related to Hypertension  Planned Interventions: Reviewed current treatment plan related to Hypertension, self-management, and adherence to plan as established by provider.  Reviewed medications and indications for use. Reports taking medications as prescribed. Denies concerns r/t medication  management or  prescription cost. Provided information regarding established blood pressure parameters along with indications for notifying a provider. Reports not currently monitoring daily. Advised to monitor BP a few times a week if unable to monitor daily and record readings.  Reviewed symptoms. Denies chest pain or palpitations. Denies headaches, dizziness, or visual changes.  Discussed compliance with recommended cardiac prudent diet. Encouraged to read nutrition labels, continue monitoring sodium intake, and avoid highly processed foods when possible.  Reviewed s/sx of heart attack, stroke and worsening symptoms that require immediate medical attention.    BP Readings from Last 3 Encounters:  07/06/22 130/66  05/17/22 132/80  01/06/22 132/76     Symptom Management: Take all medications as prescribed Attend all scheduled provider appointments Call pharmacy for medication refills 3-7 days in advance of running out of medications Check blood pressure and keep a log Call doctor for signs and symptoms of high blood pressure Continue engaging in exercises and mild activity as tolerated Read nutrition labels. Eat whole grains, fruits and vegetables, lean meats and healthy fats Limit salt intake  Call provider office for new concerns or questions    Follow Up Plan:  Will follow up for update in three months.        PLAN: Will follow up for update in three months.    [facility]/Chronic Care Management [phone]

## 2022-07-12 NOTE — Plan of Care (Signed)
Chronic Care Management Provider Comprehensive Care Plan    07/12/2022 Name: Katherine Stevens MRN: [id number] DOB: 1945/10/31  Referral to Chronic Care Management (CCM) services was placed by Provider:Tapia, Sheliah Mends, PA-C   on Date: 06/28/22,  Chronic Condition 1: Diabetes Provider Assessment and Plan  Type 2 diabetes mellitus without complication, without long-term current use of insulin (HCC) - Primary       Well controlled with diet efforts and metformin 500 mg once daily      Expected Outcome/Goals Addressed This Visit (Provider CCM goals/Provider Assessment and plan  Goal: CCM (Diabetes) Expected Outcome:  Monitor, Self-Manage And Reduce Symptoms of Diabetes  Symptom Management Condition 1: Attend all scheduled provider appointments Continue taking metformin as prescribed Attend eye exam as scheduled Monitor blood glucose and maintain a log Check feet daily for cuts, sores or redness Wash and dry feet carefully every day Wear comfortable, cotton socks Wear comfortable, well-fitting shoes Read food labels for fat, fiber, carbohydrates and portion size Call provider office for new concerns or questions     Chronic Condition 2: Hypertension Provider Assessment and Plan   Hypertension, benign       Blood pressure today fairly well-controlled on losartan 50 mg once daily     Expected Outcome/Goals Addressed This Visit (Provider CCM goals/Provider Assessment and plan  Goal: CCM (Hypertension) Expected Outcome:  Monitor, Self-Manage And Reduce Symptoms of Hypertension  Symptom Management Condition 2: Take all medications as prescribed Attend all scheduled provider appointments Call pharmacy for medication refills 3-7 days in advance of running out of medications Check blood pressure and keep a log Call doctor for signs and symptoms of high blood pressure Continue engaging in exercises and mild activity as tolerated Read nutrition labels. Eat whole grains, fruits and  vegetables, lean meats and healthy fats Limit salt intake  Call provider office for new concerns or questions   Problem List Patient Active Problem List   Diagnosis Date Noted   Stage 3a chronic kidney disease 05/17/2022   B12 deficiency 05/17/2022   Vitamin D deficiency 11/20/2020   Insomnia due to other mental disorder 05/01/2020   OAB (overactive bladder) 05/01/2020   Osteopenia 05/01/2020   History of hepatitis C 05/01/2020   Abnormality of gait and mobility 05/01/2020   Current mild episode of major depressive disorder 04/16/2019   Type 2 diabetes mellitus without complication, without long-term current use of insulin 04/16/2019   Mixed Alzheimer's and vascular dementia 04/16/2019   Folate deficiency 04/16/2019   Vitamin B1 deficiency 04/16/2019   GERD (gastroesophageal reflux disease) 11/22/2018   Tobacco dependence 12/12/2015   Hyperlipidemia 12/10/2014   Osteoarthritis of both knees 08/01/2012   Seasonal allergies 07/28/2012   Class 2 severe obesity with serious comorbidity and body mass index (BMI) of 37.0 to 37.9 in adult 07/29/2011   Hypothyroidism 12/20/2008   Hypertension, benign 12/20/2008    Medication Management  Current Outpatient Medications:    atorvastatin (LIPITOR) 40 MG tablet, TAKE 1 TABLET(40 MG) BY MOUTH AT BEDTIME FOR CHOLESTEROL, Disp: 90 tablet, Rfl: 2   buPROPion (WELLBUTRIN) 75 MG tablet, Take 1 (75 mg) tab po with breakfast and 1 tab po with lunch -  for moods and smoking cessation, Disp: 180 tablet, Rfl: 1   citalopram (CELEXA) 20 MG tablet, Take 1 tablet (20 mg total) by mouth in the morning., Disp: 90 tablet, Rfl: 3   donepezil (ARICEPT) 10 MG tablet, TAKE 1 TABLET(10 MG) BY MOUTH AT BEDTIME, Disp: 90 tablet, Rfl: 0  levocetirizine (XYZAL) 5 MG tablet, Take 1 tablet (5 mg total) by mouth every evening., Disp: 90 tablet, Rfl: 1   levothyroxine (SYNTHROID) 88 MCG tablet, TAKE 1 TABLET(88 MCG) BY MOUTH DAILY BEFORE BREAKFAST - for thyroid, Disp:  90 tablet, Rfl: 0   losartan (COZAAR) 50 MG tablet, Take 1 tablet (50 mg total) by mouth daily. For blood pressure, Disp: 90 tablet, Rfl: 1   metFORMIN (GLUCOPHAGE) 500 MG tablet, Take 1 tablet (500 mg total) by mouth daily with breakfast., Disp: 90 tablet, Rfl: 1   oxybutynin (DITROPAN-XL) 5 MG 24 hr tablet, TAKE 1 TABLET(5 MG) BY MOUTH DAILY FOR OVERACTIVE BLADDER, Disp: 90 tablet, Rfl: 3   pantoprazole (PROTONIX) 20 MG tablet, TAKE 1 TABLET(20 MG) BY MOUTH AT BEDTIME FOR GERD OR ABDOMINAL SYMPTOMS, Disp: 90 tablet, Rfl: 1   QUEtiapine (SEROQUEL) 25 MG tablet, TAKE 1 TABLET(25 MG) BY MOUTH AT BEDTIME AS NEEDED FOR INSOMNIA, Disp: 90 tablet, Rfl: 1   Travoprost, BAK Free, (TRAVATAN) 0.004 % SOLN ophthalmic solution, 1 drop at bedtime., Disp: , Rfl:    Cholecalciferol (VITAMIN D3) 50 MCG (2000 UT) capsule, Take 1 capsule (2,000 Units total) by mouth daily., Disp: 90 capsule, Rfl: 3   cyanocobalamin (VITAMIN B12) 500 MCG tablet, Take 1 tablet (500 mcg total) by mouth daily., Disp: 90 tablet, Rfl: 1  Cognitive Assessment Identity Confirmed: : Name; DOB Cognitive Status: Normal (Reports some forgetfulness)   Functional Assessment Hearing Difficulty or Deaf: no Wear Glasses or Blind: yes Vision Management: Wears prescription glasses Concentrating, Remembering or Making Decisions Difficulty (CP): yes Concentration Management: Reports forgetfulness Difficulty Communicating: no Difficulty Eating/Swallowing: no Walking or Climbing Stairs Difficulty: yes Walking or Climbing Stairs: ambulation difficulty, requires equipment Mobility Management: Uses Cane Dressing/Bathing Difficulty: no Doing Errands Independently Difficulty (such as shopping) (CP): yes Errands Management: Reports sometimes sister will assist   Caregiver Assessment  Primary Source of Support/Comfort: child(ren); sibling(s); community Name of Support/Comfort Primary Source: Ambulance person (sister) Loraine Leriche (son) Fayrene Fearing (son) People in Home:  alone Family Caregiver if Needed: child(ren), adult; sibling(s) Family Caregiver Names: Reports sister Tamela Oddi) and sons Loraine Leriche and Fayrene Fearing) are available to assist if needed   Planned Interventions  Hypertension Reviewed current treatment plan related to Hypertension, self-management, and adherence to plan as established by provider.  Reviewed medications and indications for use. Reports taking medications as prescribed. Denies concerns r/t medication management or prescription cost. Provided information regarding established blood pressure parameters along with indications for notifying a provider. Reports not currently monitoring daily. Advised to monitor BP a few times a week if unable to monitor daily and record readings.  Reviewed symptoms. Denies chest pain or palpitations. Denies headaches, dizziness, or visual changes.  Discussed compliance with recommended cardiac prudent diet. Encouraged to read nutrition labels, continue monitoring sodium intake, and avoid highly processed foods when possible.  Reviewed s/sx of heart attack, stroke and worsening symptoms that require immediate medical attention.  Diabetes Reviewed provider's plan for diabetes management.  Reviewed medication. Reports taking metformin as prescribed. Denies current concerns regarding medication management.  Discussed importance of blood glucose readings. Reports not monitoring daily. Only monitoring fasting levels once a week. Advised to monitor levels daily and record readings.  Provided information regarding hypoglycemia and hyperglycemia along with appropriate interventions. Denies recent hypoglycemic or hyperglycemic episodes. Discussed nutritional intake and importance of complying with a diabetic diet. Attempting to adhere to recommended diet and monitoring intake of added sugars. Reports increasing intake of fruits, vegetables and lean meats. Reports eating  dumplings regularly which tend to have more carbs. Thorough  discussion regarding low carb options. Encouraged to notify the team if additional referral for nutrition/dietician is needed. Discussed importance of completing recommended DM preventive care. Reports completing daily foot care as advised. Reports eye is exam is up to date. Reports pending an update eye exam within the next few months.   Discussed importance of completing ordered labs as prescribed.  Assessed social determinant of health barriers.   Interaction and coordination with outside resources, practitioners, and providers See CCM Referral  Care Plan: Available in MyChart

## 2022-07-13 ENCOUNTER — Encounter: Payer: Medicare Other | Admitting: Licensed Clinical Social Worker

## 2022-07-14 ENCOUNTER — Other Ambulatory Visit: Payer: Self-pay | Admitting: Family Medicine

## 2022-07-14 ENCOUNTER — Ambulatory Visit: Payer: Self-pay | Admitting: *Deleted

## 2022-07-14 DIAGNOSIS — Z716 Tobacco abuse counseling: Secondary | ICD-10-CM

## 2022-07-14 DIAGNOSIS — F17200 Nicotine dependence, unspecified, uncomplicated: Secondary | ICD-10-CM

## 2022-07-14 DIAGNOSIS — F320 Major depressive disorder, single episode, mild: Secondary | ICD-10-CM

## 2022-07-14 NOTE — Patient Instructions (Signed)
Visit Information  Thank you for taking time to visit with me today. Please don't hesitate to contact me if I can be of assistance to you.   Following are the goals we discussed today:   Goals Addressed             This Visit's Progress    Patient would like her advanced directives completed       Care Coordination Interventions: Advanced Directives/Living Will discussed, patient would like to name her 2 sons as her Health Care Power of Attorney Advanced Directive Packet to be mailed to patient's home for review and completion         Our next appointment is by telephone on 07/28/22 at 2:30pm  Please call the care guide team at [phone] if you need to cancel or reschedule your appointment.   If you are experiencing a Mental Health or Behavioral Health Crisis or need someone to talk to, please call 911   Patient verbalizes understanding of instructions and care plan provided today and agrees to view in MyChart. Active MyChart status and patient understanding of how to access instructions and care plan via MyChart confirmed with patient.     Telephone follow up appointment with care management team member scheduled for: 07/28/22  Verna Czech, LCSW Clinical Social Worker  [employer] Care Management [phone]

## 2022-07-14 NOTE — Patient Outreach (Signed)
  Care Coordination   Follow Up Visit Note   07/14/2022 Name: Katherine Stevens MRN: [id number] DOB: 07-Mar-1946  Katherine Stevens is a 77 y.o. year old female who sees Katherine Stevens, [place] for primary care. I spoke with  Katherine Stevens by phone today.  What matters to the patients health and wellness today?  Advanced Care Planning    Goals Addressed             This Visit's Progress    Patient would like her advanced directives completed       Care Coordination Interventions: Advanced Directives/Living Will discussed, patient would like to name her 2 sons as her Health Care Power of Attorney Advanced Directive Packet to be mailed to patient's home for review and completion         SDOH assessments and interventions completed:  Yes  SDOH Interventions Today    Flowsheet Row Most Recent Value  SDOH Interventions   Food Insecurity Interventions Intervention Not Indicated  Housing Interventions Intervention Not Indicated  Transportation Interventions Intervention Not Indicated        Care Coordination Interventions:  Yes, provided  Interventions Today    Flowsheet Row Most Recent Value  Chronic Disease   Chronic disease during today's visit Other  [dementia]  General Interventions   General Interventions Discussed/Reviewed General Interventions Reviewed  Advanced Directive Interventions   Advanced Directives Discussed/Reviewed Advanced Directives Discussed, Provided resource for acquiring and filling out documents       Follow up plan: Follow up call scheduled for 07/28/22    Encounter Outcome:  Pt. Visit Completed

## 2022-07-16 ENCOUNTER — Ambulatory Visit (INDEPENDENT_AMBULATORY_CARE_PROVIDER_SITE_OTHER): Payer: Medicare Other | Admitting: Family Medicine

## 2022-07-16 ENCOUNTER — Encounter: Payer: Self-pay | Admitting: Family Medicine

## 2022-07-16 VITALS — BP 136/76 | HR 78 | Temp 98.4°F | Resp 16 | Ht 66.5 in | Wt 231.9 lb

## 2022-07-16 DIAGNOSIS — G309 Alzheimer's disease, unspecified: Secondary | ICD-10-CM

## 2022-07-16 DIAGNOSIS — E039 Hypothyroidism, unspecified: Secondary | ICD-10-CM

## 2022-07-16 DIAGNOSIS — F0150 Vascular dementia without behavioral disturbance: Secondary | ICD-10-CM

## 2022-07-16 DIAGNOSIS — F0280 Dementia in other diseases classified elsewhere without behavioral disturbance: Secondary | ICD-10-CM

## 2022-07-16 DIAGNOSIS — N3281 Overactive bladder: Secondary | ICD-10-CM

## 2022-07-16 DIAGNOSIS — R413 Other amnesia: Secondary | ICD-10-CM

## 2022-07-16 MED ORDER — OXYBUTYNIN CHLORIDE ER 10 MG PO TB24: ORAL_TABLET | ORAL | 1 refills | Status: DC

## 2022-07-16 NOTE — Progress Notes (Signed)
Patient ID: Katherine Stevens, female    DOB: November 15, 1945, 77 y.o.   MRN: [id number]  PCP: Danelle Berry, PA-C  Chief Complaint  Patient presents with   Follow-up   Hypothyroidism    Subjective:   Katherine Stevens is a 77 y.o. female, presents to clinic with CC of the following:  HPI  Here for f/up on thyroid labs/med changes Reviewed labs with pt, she is on levothyroxine 88 mcg    Wellbutrin and celexa - mood is good, no anxiety or insomnia sx, not really using insomnia meds    07/16/2022    2:07 PM 07/14/2022   10:17 AM 07/12/2022   11:23 AM  Depression screen PHQ 2/9  Decreased Interest 0 0 0  Down, Depressed, Hopeless 0 0 1  PHQ - 2 Score 0 0 1  Altered sleeping 0    Tired, decreased energy 0    Change in appetite 0    Feeling bad or failure about yourself  0    Trouble concentrating 0    Moving slowly or fidgety/restless 0    Suicidal thoughts 0    PHQ-9 Score 0    Difficult doing work/chores Not difficult at all        Patient Active Problem List   Diagnosis Date Noted   Stage 3a chronic kidney disease 05/17/2022   B12 deficiency 05/17/2022   Vitamin D deficiency 11/20/2020   Insomnia due to other mental disorder 05/01/2020   OAB (overactive bladder) 05/01/2020   Osteopenia 05/01/2020   History of hepatitis C 05/01/2020   Abnormality of gait and mobility 05/01/2020   Current mild episode of major depressive disorder 04/16/2019   Type 2 diabetes mellitus without complication, without long-term current use of insulin 04/16/2019   Mixed Alzheimer's and vascular dementia 04/16/2019   Folate deficiency 04/16/2019   Vitamin B1 deficiency 04/16/2019   GERD (gastroesophageal reflux disease) 11/22/2018   Tobacco dependence 12/12/2015   Hyperlipidemia 12/10/2014   Osteoarthritis of both knees 08/01/2012   Seasonal allergies 07/28/2012   Class 2 severe obesity with serious comorbidity and body mass index (BMI) of 37.0 to 37.9 in adult 07/29/2011    Hypothyroidism 12/20/2008   Hypertension, benign 12/20/2008      Current Outpatient Medications:    atorvastatin (LIPITOR) 40 MG tablet, TAKE 1 TABLET(40 MG) BY MOUTH AT BEDTIME FOR CHOLESTEROL, Disp: 90 tablet, Rfl: 2   buPROPion (WELLBUTRIN) 75 MG tablet, TAKE 1 TABLET(75 MG) BY MOUTH WITH BREAKFAST AND WITH LUNCH FOR MOOD OR SMOKING CESSATION, Disp: 180 tablet, Rfl: 1   Cholecalciferol (VITAMIN D3) 50 MCG (2000 UT) capsule, Take 1 capsule (2,000 Units total) by mouth daily., Disp: 90 capsule, Rfl: 3   citalopram (CELEXA) 20 MG tablet, Take 1 tablet (20 mg total) by mouth in the morning., Disp: 90 tablet, Rfl: 3   cyanocobalamin (VITAMIN B12) 500 MCG tablet, Take 1 tablet (500 mcg total) by mouth daily., Disp: 90 tablet, Rfl: 1   donepezil (ARICEPT) 10 MG tablet, TAKE 1 TABLET(10 MG) BY MOUTH AT BEDTIME, Disp: 90 tablet, Rfl: 0   levocetirizine (XYZAL) 5 MG tablet, Take 1 tablet (5 mg total) by mouth every evening., Disp: 90 tablet, Rfl: 1   levothyroxine (SYNTHROID) 88 MCG tablet, TAKE 1 TABLET(88 MCG) BY MOUTH DAILY BEFORE BREAKFAST - for thyroid, Disp: 90 tablet, Rfl: 0   losartan (COZAAR) 50 MG tablet, Take 1 tablet (50 mg total) by mouth daily. For blood pressure, Disp: 90 tablet, Rfl: 1   metFORMIN (  GLUCOPHAGE) 500 MG tablet, Take 1 tablet (500 mg total) by mouth daily with breakfast., Disp: 90 tablet, Rfl: 1   oxybutynin (DITROPAN-XL) 5 MG 24 hr tablet, TAKE 1 TABLET(5 MG) BY MOUTH DAILY FOR OVERACTIVE BLADDER, Disp: 90 tablet, Rfl: 3   pantoprazole (PROTONIX) 20 MG tablet, TAKE 1 TABLET(20 MG) BY MOUTH AT BEDTIME FOR GERD OR ABDOMINAL SYMPTOMS, Disp: 90 tablet, Rfl: 1   QUEtiapine (SEROQUEL) 25 MG tablet, TAKE 1 TABLET(25 MG) BY MOUTH AT BEDTIME AS NEEDED FOR INSOMNIA, Disp: 90 tablet, Rfl: 1   Travoprost, BAK Free, (TRAVATAN) 0.004 % SOLN ophthalmic solution, 1 drop at bedtime., Disp: , Rfl:    Allergies  Allergen Reactions   Lisinopril Anaphylaxis    MOUTH & JAW SWELLING      Social History   Tobacco Use   Smoking status: Every Day    Packs/day: .25    Types: Cigarettes   Smokeless tobacco: Never   Tobacco comments:    Reports smoking approximately 5-6 cigarettes a day  Vaping Use   Vaping Use: Never used  Substance Use Topics   Alcohol use: Not Currently   Drug use: Not Currently      Chart Review Today: I personally reviewed active problem list, medication list, allergies, family history, social history, health maintenance, notes from last encounter, lab results, imaging with the patient/caregiver today.   Review of Systems  Constitutional: Negative.   HENT: Negative.    Eyes: Negative.   Respiratory: Negative.    Cardiovascular: Negative.   Gastrointestinal: Negative.   Endocrine: Negative.   Genitourinary: Negative.   Musculoskeletal: Negative.   Skin: Negative.   Allergic/Immunologic: Negative.   Neurological: Negative.   Hematological: Negative.   Psychiatric/Behavioral: Negative.    All other systems reviewed and are negative.      Objective:   Vitals:   07/16/22 1408  BP: 136/76  Pulse: 78  Resp: 16  Temp: 98.4 F (36.9 C)  TempSrc: Oral  SpO2: 97%  Weight: 231 lb 14.4 oz (105.2 kg)  Height: 5' 6.5" (1.689 m)    Body mass index is 36.87 kg/m.  Physical Exam Vitals and nursing note reviewed.  Constitutional:      General: She is not in acute distress.    Appearance: Normal appearance. She is well-developed. She is obese. She is not ill-appearing, toxic-appearing or diaphoretic.  HENT:     Head: Normocephalic and atraumatic.     Nose: Nose normal.  Eyes:     General:        Right eye: No discharge.        Left eye: No discharge.     Conjunctiva/sclera: Conjunctivae normal.  Neck:     Trachea: No tracheal deviation.  Cardiovascular:     Rate and Rhythm: Normal rate and regular rhythm.  Pulmonary:     Effort: Pulmonary effort is normal. No respiratory distress.     Breath sounds: No stridor.   Musculoskeletal:        General: Normal range of motion.  Skin:    General: Skin is warm and dry.     Findings: No rash.  Neurological:     Mental Status: She is alert.     Motor: No abnormal muscle tone.     Coordination: Coordination normal.     Gait: Gait abnormal (walks with cane).  Psychiatric:        Behavior: Behavior normal.      Results for orders placed or performed in visit on 05/17/22  TSH  Result Value Ref Range   TSH 0.67 0.40 - 4.50 mIU/L  COMPLETE METABOLIC PANEL WITH GFR  Result Value Ref Range   Glucose, Bld 74 65 - 99 mg/dL   BUN 12 7 - 25 mg/dL   Creat 1.61 0.96 - 0.45 mg/dL   eGFR 61 > OR = 60 WU/JWJ/1.91Y7   BUN/Creatinine Ratio SEE NOTE: 6 - 22 (calc)   Sodium 140 135 - 146 mmol/L   Potassium 4.2 3.5 - 5.3 mmol/L   Chloride 106 98 - 110 mmol/L   CO2 25 20 - 32 mmol/L   Calcium 8.9 8.6 - 10.4 mg/dL   Total Protein 7.9 6.1 - 8.1 g/dL   Albumin 4.1 3.6 - 5.1 g/dL   Globulin 3.8 (H) 1.9 - 3.7 g/dL (calc)   AG Ratio 1.1 1.0 - 2.5 (calc)   Total Bilirubin 0.3 0.2 - 1.2 mg/dL   Alkaline phosphatase (APISO) 148 37 - 153 U/L   AST 17 10 - 35 U/L   ALT 10 6 - 29 U/L  Lipid panel  Result Value Ref Range   Cholesterol 101 <200 mg/dL   HDL 53 > OR = 50 mg/dL   Triglycerides 54 <829 mg/dL   LDL Cholesterol (Calc) 35 mg/dL (calc)   Total CHOL/HDL Ratio 1.9 <5.0 (calc)   Non-HDL Cholesterol (Calc) 48 <562 mg/dL (calc)  Hemoglobin Z3Y  Result Value Ref Range   Hgb A1c MFr Bld 6.6 (H) <5.7 % of total Hgb   Mean Plasma Glucose 143 mg/dL   eAG (mmol/L) 7.9 mmol/L  CBC with Differential/Platelet  Result Value Ref Range   WBC 10.8 3.8 - 10.8 Thousand/uL   RBC 4.28 3.80 - 5.10 Million/uL   Hemoglobin 12.4 11.7 - 15.5 g/dL   HCT 86.5 78.4 - 69.6 %   MCV 86.9 80.0 - 100.0 fL   MCH 29.0 27.0 - 33.0 pg   MCHC 33.3 32.0 - 36.0 g/dL   RDW 29.5 28.4 - 13.2 %   Platelets 251 140 - 400 Thousand/uL   MPV 10.4 7.5 - 12.5 fL   Neutro Abs 5,346 1,500 - 7,800  cells/uL   Lymphs Abs 4,482 (H) 850 - 3,900 cells/uL   Absolute Monocytes 756 200 - 950 cells/uL   Eosinophils Absolute 151 15 - 500 cells/uL   Basophils Absolute 65 0 - 200 cells/uL   Neutrophils Relative % 49.5 %   Total Lymphocyte 41.5 %   Monocytes Relative 7.0 %   Eosinophils Relative 1.4 %   Basophils Relative 0.6 %  Vitamin B12  Result Value Ref Range   Vitamin B-12 383 200 - 1,100 pg/mL       Assessment & Plan:     ICD-10-CM   1. Hypothyroidism, unspecified type  E03.9    first time in months and years that TSH was in normal range, continue synthroid 88 mcg daily, no med changes, pt euthyroid    2. Mixed Alzheimer's and vascular dementia  G30.9 Ambulatory referral to Neurology   F01.50    F02.80    recommend she f/up with neurology    3. Memory problem  R41.3 Ambulatory referral to Neurology   f/up neurology    4. OAB (overactive bladder)  N32.81 oxybutynin (DITROPAN-XL) 10 MG 24 hr tablet   slight worsening of sx, she asked for med dose increase, discussed diet/lifestyle efforts and med SE          Danelle Berry, PA-C 07/16/22 2:25 PM

## 2022-07-16 NOTE — Patient Instructions (Signed)
MyChart Username: [email or web address]  Password:   [email or web address]

## 2022-07-22 ENCOUNTER — Encounter: Payer: Self-pay | Admitting: Family Medicine

## 2022-07-23 ENCOUNTER — Ambulatory Visit (INDEPENDENT_AMBULATORY_CARE_PROVIDER_SITE_OTHER): Payer: Medicare Other

## 2022-07-23 VITALS — Ht 66.0 in | Wt 231.0 lb

## 2022-07-23 DIAGNOSIS — Z0000 Encounter for general adult medical examination without abnormal findings: Secondary | ICD-10-CM

## 2022-07-23 NOTE — Patient Instructions (Signed)
Ms. Katherine Stevens , Thank you for taking time to come for your Medicare Wellness Visit. I appreciate your ongoing commitment to your health goals. Please review the following plan we discussed and let me know if I can assist you in the future.   These are the goals we discussed:  Goals      CCM Expected Outcome:  Monitor, Self-Manage and Reduce Symptoms of:      FAMILY SUPPORT HOME ALONE HAS PENDING EYE EXAM Mouth watering--drooling No chest pain, no palpitations,  judit awad [phone] son Kyra Laffey [phone] son Send info regarding dpr and advanced directive     BP Readings from Last 3 Encounters:  07/06/22 130/66  05/17/22 132/80  01/06/22 132/76        Increase physical activity     Recommend increasing physical activity as tolerated      Patient Stated     Current Barriers:  Chronic Disease Management support and education needs related to Diabetes  Planned Interventions:     Lab Results  Component Value Date   HGBA1C 6.6 (H) 05/17/2022     Paying attention to added More information regarding fruits and vegetables Lean meat/boiled chicken Enjoys dumplings Reports not checking daily/now only once a week Upcoming eye exam Checking feet/  Symptom Management:     Follow Up Plan:        Patient would like her advanced directives completed     Care Coordination Interventions: Advanced Directives/Living Will discussed, patient would like to name her 2 sons as her Health Care Power of Attorney Advanced Directive Packet to be mailed to patient's home for review and completion      per son "I need a letter for court stating that my mother needs me for her care"     Care Coordination Interventions: Reviewed request for provider letter confirming that he is patient's primary caregiver Discussed provider limitations regarding this request Collaborated with provider office regarding fulfilling request Patient's son to discuss additional  alternatives with is attorney     Quit Smoking     If you wish to quit smoking, help is available. For free tobacco cessation program offerings call the [facility] at (310)797-8057 or Live Well Line at 657-679-9351. You may also visit [email or web address] or email [email or web address] for more information on other programs.          This is a list of the screening recommended for you and due dates:  Health Maintenance  Topic Date Due   DTaP/Tdap/Td vaccine (1 - Tdap) Never done   Eye exam for diabetics  03/18/2022   COVID-19 Vaccine (6 - 2023-24 season) 04/08/2022   Complete foot exam   10/09/2022   Flu Shot  11/04/2022   Hemoglobin A1C  11/15/2022   Yearly kidney health urinalysis for diabetes  11/20/2022   Yearly kidney function blood test for diabetes  05/18/2023   Mammogram  07/05/2023   Medicare Annual Wellness Visit  07/23/2023   DEXA scan (bone density measurement)  07/04/2024   Pneumonia Vaccine  Completed   HPV Vaccine  Aged Out   Zoster (Shingles) Vaccine  Discontinued    Advanced directives: no  Conditions/risks identified: low falls risk  Next appointment: Follow up in one year for your annual wellness visit 07/29/2023 @2 :30pm telephone   Preventive Care 65 Years and Older, Female Preventive care refers to lifestyle choices and visits with your health care provider that can promote health and wellness. What does preventive care include? A  yearly physical exam. This is also called an annual well check. Dental exams once or twice a year. Routine eye exams. Ask your health care provider how often you should have your eyes checked. Personal lifestyle choices, including: Daily care of your teeth and gums. Regular physical activity. Eating a healthy diet. Avoiding tobacco and drug use. Limiting alcohol use. Practicing safe sex. Taking low-dose aspirin every day. Taking vitamin and mineral supplements as recommended by your health care  provider. What happens during an annual well check? The services and screenings done by your health care provider during your annual well check will depend on your age, overall health, lifestyle risk factors, and family history of disease. Counseling  Your health care provider may ask you questions about your: Alcohol use. Tobacco use. Drug use. Emotional well-being. Home and relationship well-being. Sexual activity. Eating habits. History of falls. Memory and ability to understand (cognition). Work and work Astronomer. Reproductive health. Screening  You may have the following tests or measurements: Height, weight, and BMI. Blood pressure. Lipid and cholesterol levels. These may be checked every 5 years, or more frequently if you are over 70 years old. Skin check. Lung cancer screening. You may have this screening every year starting at age 32 if you have a 30-pack-year history of smoking and currently smoke or have quit within the past 15 years. Fecal occult blood test (FOBT) of the stool. You may have this test every year starting at age 53. Flexible sigmoidoscopy or colonoscopy. You may have a sigmoidoscopy every 5 years or a colonoscopy every 10 years starting at age 24. Hepatitis C blood test. Hepatitis B blood test. Sexually transmitted disease (STD) testing. Diabetes screening. This is done by checking your blood sugar (glucose) after you have not eaten for a while (fasting). You may have this done every 1-3 years. Bone density scan. This is done to screen for osteoporosis. You may have this done starting at age 60. Mammogram. This may be done every 1-2 years. Talk to your health care provider about how often you should have regular mammograms. Talk with your health care provider about your test results, treatment options, and if necessary, the need for more tests. Vaccines  Your health care provider may recommend certain vaccines, such as: Influenza vaccine. This is  recommended every year. Tetanus, diphtheria, and acellular pertussis (Tdap, Td) vaccine. You may need a Td booster every 10 years. Zoster vaccine. You may need this after age 73. Pneumococcal 13-valent conjugate (PCV13) vaccine. One dose is recommended after age 21. Pneumococcal polysaccharide (PPSV23) vaccine. One dose is recommended after age 16. Talk to your health care provider about which screenings and vaccines you need and how often you need them. This information is not intended to replace advice given to you by your health care provider. Make sure you discuss any questions you have with your health care provider. Document Released: 04/18/2015 Document Revised: 12/10/2015 Document Reviewed: 01/21/2015 Elsevier Interactive Patient Education  2017 [employer].  Fall Prevention in the Home Falls can cause injuries. They can happen to people of all ages. There are many things you can do to make your home safe and to help prevent falls. What can I do on the outside of my home? Regularly fix the edges of walkways and driveways and fix any cracks. Remove anything that might make you trip as you walk through a door, such as a raised step or threshold. Trim any bushes or trees on the path to your home. Use bright  outdoor lighting. Clear any walking paths of anything that might make someone trip, such as rocks or tools. Regularly check to see if handrails are loose or broken. Make sure that both sides of any steps have handrails. Any raised decks and porches should have guardrails on the edges. Have any leaves, snow, or ice cleared regularly. Use sand or salt on walking paths during winter. Clean up any spills in your garage right away. This includes oil or grease spills. What can I do in the bathroom? Use night lights. Install grab bars by the toilet and in the tub and shower. Do not use towel bars as grab bars. Use non-skid mats or decals in the tub or shower. If you need to sit down in  the shower, use a plastic, non-slip stool. Keep the floor dry. Clean up any water that spills on the floor as soon as it happens. Remove soap buildup in the tub or shower regularly. Attach bath mats securely with double-sided non-slip rug tape. Do not have throw rugs and other things on the floor that can make you trip. What can I do in the bedroom? Use night lights. Make sure that you have a light by your bed that is easy to reach. Do not use any sheets or blankets that are too big for your bed. They should not hang down onto the floor. Have a firm chair that has side arms. You can use this for support while you get dressed. Do not have throw rugs and other things on the floor that can make you trip. What can I do in the kitchen? Clean up any spills right away. Avoid walking on wet floors. Keep items that you use a lot in easy-to-reach places. If you need to reach something above you, use a strong step stool that has a grab bar. Keep electrical cords out of the way. Do not use floor polish or wax that makes floors slippery. If you must use wax, use non-skid floor wax. Do not have throw rugs and other things on the floor that can make you trip. What can I do with my stairs? Do not leave any items on the stairs. Make sure that there are handrails on both sides of the stairs and use them. Fix handrails that are broken or loose. Make sure that handrails are as long as the stairways. Check any carpeting to make sure that it is firmly attached to the stairs. Fix any carpet that is loose or worn. Avoid having throw rugs at the top or bottom of the stairs. If you do have throw rugs, attach them to the floor with carpet tape. Make sure that you have a light switch at the top of the stairs and the bottom of the stairs. If you do not have them, ask someone to add them for you. What else can I do to help prevent falls? Wear shoes that: Do not have high heels. Have rubber bottoms. Are comfortable  and fit you well. Are closed at the toe. Do not wear sandals. If you use a stepladder: Make sure that it is fully opened. Do not climb a closed stepladder. Make sure that both sides of the stepladder are locked into place. Ask someone to hold it for you, if possible. Clearly mark and make sure that you can see: Any grab bars or handrails. First and last steps. Where the edge of each step is. Use tools that help you move around (mobility aids) if they are needed. These  include: Canes. Walkers. Scooters. Crutches. Turn on the lights when you go into a dark area. Replace any light bulbs as soon as they burn out. Set up your furniture so you have a clear path. Avoid moving your furniture around. If any of your floors are uneven, fix them. If there are any pets around you, be aware of where they are. Review your medicines with your doctor. Some medicines can make you feel dizzy. This can increase your chance of falling. Ask your doctor what other things that you can do to help prevent falls. This information is not intended to replace advice given to you by your health care provider. Make sure you discuss any questions you have with your health care provider. Document Released: 01/16/2009 Document Revised: 08/28/2015 Document Reviewed: 04/26/2014 Elsevier Interactive Patient Education  2017 [employer].

## 2022-07-23 NOTE — Progress Notes (Signed)
I connected with  Katherine Stevens on 07/23/22 by a audio enabled telemedicine application and verified that I am speaking with the correct person using two identifiers.  Patient Location: Home  Provider Location: Office/Clinic  I discussed the limitations of evaluation and management by telemedicine. The patient expressed understanding and agreed to proceed.  Subjective:   Katherine Stevens is a 77 y.o. female who presents for Medicare Annual (Subsequent) preventive examination.  Review of Systems    Cardiac Risk Factors include: advanced age (>68men, >56 women);diabetes mellitus;dyslipidemia;hypertension;obesity (BMI >30kg/m2);smoking/ tobacco exposure;sedentary lifestyle    Objective:    Today's Vitals   07/23/22 1431  Weight: 231 lb (104.8 kg)  Height: 5\' 6"  (1.676 m)   Body mass index is 37.28 kg/m.     07/23/2022    2:39 PM 07/09/2021    9:50 AM 07/08/2020    9:31 AM 07/03/2019    3:18 PM 03/31/2019   11:54 AM  Advanced Directives  Does Patient Have a Medical Advance Directive? No No No No No  Would patient like information on creating a medical advance directive?  Yes (MAU/Ambulatory/Procedural Areas - Information given) No - Patient declined No - Patient declined     Current Medications (verified) Outpatient Encounter Medications as of 07/23/2022  Medication Sig   atorvastatin (LIPITOR) 40 MG tablet TAKE 1 TABLET(40 MG) BY MOUTH AT BEDTIME FOR CHOLESTEROL   buPROPion (WELLBUTRIN) 75 MG tablet TAKE 1 TABLET(75 MG) BY MOUTH WITH BREAKFAST AND WITH LUNCH FOR MOOD OR SMOKING CESSATION   Cholecalciferol (VITAMIN D3) 50 MCG (2000 UT) capsule Take 1 capsule (2,000 Units total) by mouth daily.   citalopram (CELEXA) 20 MG tablet Take 1 tablet (20 mg total) by mouth in the morning.   cyanocobalamin (VITAMIN B12) 500 MCG tablet Take 1 tablet (500 mcg total) by mouth daily.   donepezil (ARICEPT) 10 MG tablet TAKE 1 TABLET(10 MG) BY MOUTH AT BEDTIME   levocetirizine (XYZAL) 5 MG  tablet Take 1 tablet (5 mg total) by mouth every evening.   levothyroxine (SYNTHROID) 88 MCG tablet TAKE 1 TABLET(88 MCG) BY MOUTH DAILY BEFORE BREAKFAST - for thyroid   losartan (COZAAR) 50 MG tablet Take 1 tablet (50 mg total) by mouth daily. For blood pressure   metFORMIN (GLUCOPHAGE) 500 MG tablet Take 1 tablet (500 mg total) by mouth daily with breakfast.   oxybutynin (DITROPAN-XL) 10 MG 24 hr tablet TAKE 1 TABLET(5 MG) BY MOUTH DAILY FOR OVERACTIVE BLADDER   pantoprazole (PROTONIX) 20 MG tablet TAKE 1 TABLET(20 MG) BY MOUTH AT BEDTIME FOR GERD OR ABDOMINAL SYMPTOMS   QUEtiapine (SEROQUEL) 25 MG tablet TAKE 1 TABLET(25 MG) BY MOUTH AT BEDTIME AS NEEDED FOR INSOMNIA   Travoprost, BAK Free, (TRAVATAN) 0.004 % SOLN ophthalmic solution 1 drop at bedtime.   No facility-administered encounter medications on file as of 07/23/2022.    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   Depression    Diabetes mellitus without complication    Hyperlipidemia    Hypertension    Memory loss    Overactive bladder    Thyroid disease    Vertigo 04/16/2019   Past Surgical History:  Procedure Laterality Date   ABDOMINAL HYSTERECTOMY     Family History  Problem Relation Age of Onset   Heart failure Mother    Diabetes Mother    Diabetes Sister  Hyperlipidemia Sister    Thyroid disease Sister    Stroke Brother    Heart attack Brother    Cancer Maternal Grandmother    Heart attack Brother    Social History   Socioeconomic History   Marital status: Divorced    Spouse name: Not on file   Number of children: 2   Years of education: Not on file   Highest education level: GED or equivalent  Occupational History   Not on file  Tobacco Use   Smoking status: Every Day    Packs/day: .25    Types: Cigarettes   Smokeless tobacco: Never   Tobacco comments:    Reports  smoking approximately 5-6 cigarettes a day  Vaping Use   Vaping Use: Never used  Substance and Sexual Activity   Alcohol use: Not Currently   Drug use: Not Currently   Sexual activity: Not Currently  Other Topics Concern   Not on file  Social History Narrative   Pt lives alone   Social Determinants of Health   Financial Resource Strain: Low Risk  (07/23/2022)   Overall Financial Resource Strain (CARDIA)    Difficulty of Paying Living Expenses: Not very hard  Food Insecurity: No Food Insecurity (07/23/2022)   Hunger Vital Sign    Worried About Running Out of Food in the Last Year: Never true    Ran Out of Food in the Last Year: Never true  Transportation Needs: No Transportation Needs (07/23/2022)   PRAPARE - Administrator, Civil Service (Medical): No    Lack of Transportation (Non-Medical): No  Physical Activity: Inactive (07/23/2022)   Exercise Vital Sign    Days of Exercise per Week: 0 days    Minutes of Exercise per Session: 0 min  Stress: No Stress Concern Present (07/23/2022)   [employer] of Occupational Health - Occupational Stress Questionnaire    Feeling of Stress : Only a little  Social Connections: Moderately Integrated (07/23/2022)   Social Connection and Isolation Panel [NHANES]    Frequency of Communication with Friends and Family: More than three times a week    Frequency of Social Gatherings with Friends and Family: More than three times a week    Attends Religious Services: 1 to 4 times per year    Active Member of [employer] or Organizations: Yes    Attends Banker Meetings: 1 to 4 times per year    Marital Status: Divorced    Tobacco Counseling Ready to quit: Not Answered Counseling given: Not Answered Tobacco comments: Reports smoking approximately 5-6 cigarettes a day  Clinical Intake:  Pre-visit preparation completed: Yes  Pain : No/denies pain   BMI - recorded: 37.28 Nutritional Status: BMI > 30  Obese Nutritional  Risks: None Diabetes: Yes CBG done?: No Did pt. bring in CBG monitor from home?: No  How often do you need to have someone help you when you read instructions, pamphlets, or other written materials from your doctor or pharmacy?: 1 - Never  Diabetic?yes  Interpreter Needed?: No  Comments: live alone Information entered by :: B.Yanelli Zapanta,LPN  Activities of Daily Living    07/23/2022    2:39 PM 07/16/2022    2:07 PM  In your present state of health, do you have any difficulty performing the following activities:  Hearing? 0 0  Vision? 0 0  Difficulty concentrating or making decisions? 1 1  Walking or climbing stairs? 1 1  Dressing or bathing? 0 0  Doing errands, shopping? 0  0  Preparing Food and eating ? N   Using the Toilet? N   In the past six months, have you accidently leaked urine? N   Do you have problems with loss of bowel control? N   Managing your Medications? N   Managing your Finances? N   Housekeeping or managing your Housekeeping? N     Patient Care Team: Danelle Berry, PA-C as PCP - General (Family Medicine) Duanne Limerick (Neurology) Midge Minium, MD as Consulting Physician (Gastroenterology) Lonell Face, MD as Consulting Physician (Neurology)  Indicate any recent Medical Services you may have received from other than [facility] providers in the past year (date may be approximate).     Assessment:   This is a routine wellness examination for [place].  Hearing/Vision screen Hearing Screening - Comments:: Adequate hearing Vision Screening - Comments:: Adequate vision w/glasses Dr Marti Sleigh  Dietary issues and exercise activities discussed: Current Exercise Habits: The patient does not participate in regular exercise at present, Exercise limited by: orthopedic condition(s)   Goals Addressed             This Visit's Progress    Increase physical activity   Not on track    Recommend increasing physical activity as tolerated      Quit Smoking    Not on track    If you wish to quit smoking, help is available. For free tobacco cessation program offerings call the [facility] at (828) 803-7907 or Live Well Line at 623-222-8928. You may also visit [email or web address] or email [email or web address] for more information on other programs.         Depression Screen    07/23/2022    2:36 PM 07/16/2022    2:07 PM 07/14/2022   10:17 AM 07/12/2022   11:23 AM 07/06/2022   11:05 AM 05/17/2022    1:38 PM 01/06/2022    3:01 PM  PHQ 2/9 Scores  PHQ - 2 Score 0 0 0 1 0 0 0  PHQ- 9 Score 0 0   0 0 6    Fall Risk    07/23/2022    2:34 PM 07/16/2022    2:07 PM 07/06/2022   11:05 AM 05/17/2022    1:37 PM 01/06/2022    3:01 PM  Fall Risk   Falls in the past year? 0 0 0 0 0  Number falls in past yr: 0 0 0 0 0  Injury with Fall? 0 0 0 0 0  Risk for fall due to : No Fall Risks No Fall Risks No Fall Risks No Fall Risks No Fall Risks  Follow up Education provided;Falls prevention discussed Falls prevention discussed;Education provided;Falls evaluation completed Falls prevention discussed;Education provided;Falls evaluation completed Falls prevention discussed;Education provided;Falls evaluation completed Falls prevention discussed;Education provided    FALL RISK PREVENTION PERTAINING TO THE HOME:  Any stairs in or around the home? No  If so, are there any without handrails? No  Home free of loose throw rugs in walkways, pet beds, electrical cords, etc? Yes  Adequate lighting in your home to reduce risk of falls? Yes   ASSISTIVE DEVICES UTILIZED TO PREVENT FALLS:  Life alert? No  Use of a cane, walker or w/c? Yes cane Grab bars in the bathroom? Yes  Shower chair or bench in shower? Yes  Elevated toilet seat or a handicapped toilet? Yes   Cognitive Function:        07/23/2022    2:44 PM 07/03/2019    3:24  PM  6CIT Screen  What Year? 0 points 0 points  What month? 0 points 0 points  What time? 0 points 0 points  Count  back from 20 0 points 0 points  Months in reverse 0 points 0 points  Repeat phrase 0 points 2 points  Total Score 0 points 2 points    Immunizations Immunization History  Administered Date(s) Administered   Influenza,inj,Quad PF,6+ Mos 03/20/2015, 12/12/2015, 01/20/2017, 02/15/2018, 01/04/2019   Moderna Sars-Covid-2 Vaccination 03/25/2020, 05/01/2020, 10/24/2020, 12/26/2020, 02/11/2022   Pneumococcal Conjugate-13 09/13/2013   Pneumococcal Polysaccharide-23 10/30/2014    TDAP status: Due, Education has been provided regarding the importance of this vaccine. Advised may receive this vaccine at local pharmacy or Health Dept. Aware to provide a copy of the vaccination record if obtained from local pharmacy or Health Dept. Verbalized acceptance and understanding.  Flu Vaccine status: Up to date  Pneumococcal vaccine status: Up to date  Covid-19 vaccine status: Completed vaccines  Qualifies for Shingles Vaccine? Yes   Zostavax completed No   Shingrix Completed?: No.    Education has been provided regarding the importance of this vaccine. Patient has been advised to call insurance company to determine out of pocket expense if they have not yet received this vaccine. Advised may also receive vaccine at local pharmacy or Health Dept. Verbalized acceptance and understanding.  Screening Tests Health Maintenance  Topic Date Due   DTaP/Tdap/Td (1 - Tdap) Never done   OPHTHALMOLOGY EXAM  03/18/2022   COVID-19 Vaccine (6 - 2023-24 season) 04/08/2022   FOOT EXAM  10/09/2022   INFLUENZA VACCINE  11/04/2022   HEMOGLOBIN A1C  11/15/2022   Diabetic kidney evaluation - Urine ACR  11/20/2022   Diabetic kidney evaluation - eGFR measurement  05/18/2023   MAMMOGRAM  07/05/2023   Medicare Annual Wellness (AWV)  07/23/2023   DEXA SCAN  07/04/2024   Pneumonia Vaccine 64+ Years old  Completed   HPV VACCINES  Aged Out   Zoster Vaccines- Shingrix  Discontinued    Health Maintenance  Health  Maintenance Due  Topic Date Due   DTaP/Tdap/Td (1 - Tdap) Never done   OPHTHALMOLOGY EXAM  03/18/2022   COVID-19 Vaccine (6 - 2023-24 season) 04/08/2022    Colorectal cancer screening: No longer required.   Mammogram status: No longer required due to age.  Bone Density status: Completed yes. Results reflect: Bone density results: OSTEOPENIA. Repeat every 5 years.  Lung Cancer Screening: (Low Dose CT Chest recommended if Age 28-80 years, 30 pack-year currently smoking OR have quit w/in 15years.) does qualify.   Lung Cancer Screening Referral: no pt declines  Additional Screening:  Hepatitis C Screening: does not qualify; Completed yes  Vision Screening: Recommended annual ophthalmology exams for early detection of glaucoma and other disorders of the eye. Is the patient up to date with their annual eye exam?  Yes  Who is the provider or what is the name of the office in which the patient attends annual eye exams? Dr Marti Sleigh If pt is not established with a provider, would they like to be referred to a provider to establish care? No .   Dental Screening: Recommended annual dental exams for proper oral hygiene  Community Resource Referral / Chronic Care Management: CRR required this visit?  No   CCM required this visit?  No    Plan:     I have personally reviewed and noted the following in the patient's chart:   Medical and social history Use of alcohol, tobacco  or illicit drugs  Current medications and supplements including opioid prescriptions. Patient is not currently taking opioid prescriptions. Functional ability and status Nutritional status Physical activity Advanced directives List of other physicians Hospitalizations, surgeries, and ER visits in previous 12 months Vitals Screenings to include cognitive, depression, and falls Referrals and appointments  In addition, I have reviewed and discussed with patient certain preventive protocols, quality metrics, and best  practice recommendations. A written personalized care plan for preventive services as well as general preventive health recommendations were provided to patient.     Sue Lush, LPN   1/61/0960   Nurse Notes: pt says she is doing alright. She does complain of her mouth watering when she takes her dentures out. Otherwise, she is doing good.

## 2022-07-28 ENCOUNTER — Ambulatory Visit: Payer: Self-pay | Admitting: *Deleted

## 2022-07-28 NOTE — Patient Instructions (Signed)
Visit Information  Thank you for taking time to visit with me today. Please don't hesitate to contact me if I can be of assistance to you.   Following are the goals we discussed today:   Goals Addressed             This Visit's Progress    Patient would like her advanced directives completed       Care Coordination Interventions: Advanced Directives/Living Will continues to be discussed, patient would like to name her 2 sons as her Health Care Power of Attorney Advanced Directive Packet received by mail to patient's home for review and completion Packet reviewed, however more time is needed to review-follow up phone call scheduled for 07/30/22 1pm         Our next appointment is by telephone on 07/30/22 at 1pm  Please call the care guide team at [phone] if you need to cancel or reschedule your appointment.   If you are experiencing a Mental Health or Behavioral Health Crisis or need someone to talk to, please call 911   Patient verbalizes understanding of instructions and care plan provided today and agrees to view in MyChart. Active MyChart status and patient understanding of how to access instructions and care plan via MyChart confirmed with patient.     Telephone follow up appointment with care management team member scheduled for: 07/30/22  Verna Czech, LCSW Clinical Social Worker  [facility] Care Management [phone]

## 2022-07-28 NOTE — Patient Outreach (Signed)
  Care Coordination   Follow Up Visit Note   07/28/2022 Name: Katherine Stevens MRN: [id number] DOB: 1945-08-26  Katherine Stevens is a 77 y.o. year old female who sees Danelle Berry, [place] for primary care. I spoke with  [name] by phone today.  What matters to the patients health and wellness today?  Advanced Directive/Living Will completion    Goals Addressed             This Visit's Progress    Patient would like her advanced directives completed       Care Coordination Interventions: Advanced Directives/Living Will continues to be discussed, patient would like to name her 2 sons as her Health Care Power of Attorney Advanced Directive Packet received by mail to patient's home for review and completion Packet reviewed, however more time is needed to review-follow up phone call scheduled for 07/30/22 1pm         SDOH assessments and interventions completed:  Yes  SDOH Interventions Today    Flowsheet Row Most Recent Value  SDOH Interventions   Food Insecurity Interventions Intervention Not Indicated  Housing Interventions Intervention Not Indicated  Transportation Interventions Intervention Not Indicated        Care Coordination Interventions:  Yes, provided  Interventions Today    Flowsheet Row Most Recent Value  Chronic Disease   Chronic disease during today's visit Other  [dementia]  General Interventions   General Interventions Discussed/Reviewed General Interventions Reviewed  Advanced Directive Interventions   Advanced Directives Discussed/Reviewed Advanced Directives Reviewed, Provided resource for acquiring and filling out documents       Follow up plan: Follow up call scheduled for 07/30/22     Encounter Outcome:  Pt. Visit Completed

## 2022-07-30 ENCOUNTER — Ambulatory Visit: Payer: Self-pay | Admitting: *Deleted

## 2022-07-30 NOTE — Patient Outreach (Signed)
  Care Coordination   Follow Up Visit Note   07/30/2022 Name: Katherine Stevens MRN: [id number] DOB: 27-Apr-1945  Katherine Stevens is a 77 y.o. year old female who sees Danelle Berry, [place] for primary care. I spoke with  [name] by phone today.  What matters to the patients health and wellness today?  Advanced Care Planning    Goals Addressed             This Visit's Progress    Patient would like her advanced directives completed       Care Coordination Interventions: Advanced Directives/Living Will continues to be discussed, patient would like to name her 2 sons as her Health Care Power of Journalist, newspaper received by mail to patient's home, document reviewed, instructions provided on completion Patient will have document notarized         SDOH assessments and interventions completed:  No     Care Coordination Interventions:  Yes, provided  Interventions Today    Flowsheet Row Most Recent Value  Chronic Disease   Chronic disease during today's visit Other  [dementia]  General Interventions   General Interventions Discussed/Reviewed General Interventions Reviewed  Advanced Directive Interventions   Advanced Directives Discussed/Reviewed Advanced Directives Reviewed  [Reviewed document for completion-patient agreeable to getting document notarized]       Follow up plan: Follow up call scheduled for 08/13/22    Encounter Outcome:  Pt. Visit Completed

## 2022-07-30 NOTE — Patient Instructions (Signed)
Visit Information  Thank you for taking time to visit with me today. Please don't hesitate to contact me if I can be of assistance to you.   Following are the goals we discussed today:   Goals Addressed             This Visit's Progress    Patient would like her advanced directives completed       Care Coordination Interventions: Advanced Directives/Living Will continues to be discussed, patient would like to name her 2 sons as her Health Care Power of Attorney Advanced Directive Packet received by mail to patient's home, document reviewed, instructions provided on completion Patient will have document notarized         Our next appointment is by telephone on 08/13/22 at 10am  Please call the care guide team at [phone] if you need to cancel or reschedule your appointment.   If you are experiencing a Mental Health or Behavioral Health Crisis or need someone to talk to, please call 911   Patient verbalizes understanding of instructions and care plan provided today and agrees to view in MyChart. Active MyChart status and patient understanding of how to access instructions and care plan via MyChart confirmed with patient.     Telephone follow up appointment with care management team member scheduled for: 08/13/22  Verna Czech, LCSW Clinical Social Worker  [facility] Care Management [phone]

## 2022-08-03 DIAGNOSIS — F17210 Nicotine dependence, cigarettes, uncomplicated: Secondary | ICD-10-CM

## 2022-08-03 DIAGNOSIS — I10 Essential (primary) hypertension: Secondary | ICD-10-CM

## 2022-08-03 DIAGNOSIS — E1159 Type 2 diabetes mellitus with other circulatory complications: Secondary | ICD-10-CM | POA: Diagnosis not present

## 2022-08-13 ENCOUNTER — Ambulatory Visit: Payer: Self-pay | Admitting: *Deleted

## 2022-08-13 NOTE — Patient Outreach (Signed)
  Care Coordination   Follow Up Visit Note   08/13/2022 Name: Dorathy Bodenhamer MRN: [id number] DOB: Sep 02, 1945  Penda Coblentz is a 77 y.o. year old female who sees Danelle Berry, [place] for primary care. I spoke with  [name] by phone today.  What matters to the patients health and wellness today?  Completion of Advanced Directives . Patient discussed having no other community resource needs at this time   Goals Addressed             This Visit's Progress    Patient would like her advanced directives completed       Care Coordination Interventions: Advanced Directives/Living Will continues to be discussed, completed by phone but will need document notarized Patient will have document notarized before her PCP next PCP appointment          SDOH assessments and interventions completed:  No     Care Coordination Interventions:  Yes, provided  Interventions Today    Flowsheet Row Most Recent Value  Chronic Disease   Chronic disease during today's visit Other  [dementia]  General Interventions   General Interventions Discussed/Reviewed General Interventions Reviewed  Advanced Directive Interventions   Advanced Directives Discussed/Reviewed Advanced Directives Reviewed  [patient confirms that she has the document, has not had it notarized yet but plans to before her next appointment with the PCP-assessed for questions/concerns]       Follow up plan: Follow up call scheduled for 10/11/22    Encounter Outcome:  Pt. Visit Completed

## 2022-08-13 NOTE — Patient Instructions (Signed)
Visit Information  Thank you for taking time to visit with me today. Please don't hesitate to contact me if I can be of assistance to you.   Following are the goals we discussed today:   Goals Addressed             This Visit's Progress    Patient would like her advanced directives completed       Care Coordination Interventions: Advanced Directives/Living Will continues to be discussed, completed by phone but will need document notarized Patient will have document notarized before her PCP next PCP appointment          Our next appointment is by telephone on 10/11/22 at 10am  Please call the care guide team at [phone] if you need to cancel or reschedule your appointment.   If you are experiencing a Mental Health or Behavioral Health Crisis or need someone to talk to, please call 911   Patient verbalizes understanding of instructions and care plan provided today and agrees to view in MyChart. Active MyChart status and patient understanding of how to access instructions and care plan via MyChart confirmed with patient.     Telephone follow up appointment with care management team member scheduled for: 10/11/22  Verna Czech, LCSW Clinical Social Worker  [facility] Care Management [phone]

## 2022-08-31 ENCOUNTER — Other Ambulatory Visit: Payer: Self-pay | Admitting: Family Medicine

## 2022-08-31 DIAGNOSIS — E039 Hypothyroidism, unspecified: Secondary | ICD-10-CM

## 2022-09-14 DIAGNOSIS — G309 Alzheimer's disease, unspecified: Secondary | ICD-10-CM | POA: Diagnosis not present

## 2022-09-14 DIAGNOSIS — E119 Type 2 diabetes mellitus without complications: Secondary | ICD-10-CM | POA: Diagnosis not present

## 2022-09-14 DIAGNOSIS — R2689 Other abnormalities of gait and mobility: Secondary | ICD-10-CM | POA: Diagnosis not present

## 2022-09-29 DIAGNOSIS — H4010X1 Unspecified open-angle glaucoma, mild stage: Secondary | ICD-10-CM | POA: Diagnosis not present

## 2022-09-29 DIAGNOSIS — H401133 Primary open-angle glaucoma, bilateral, severe stage: Secondary | ICD-10-CM | POA: Diagnosis not present

## 2022-09-29 DIAGNOSIS — H2510 Age-related nuclear cataract, unspecified eye: Secondary | ICD-10-CM | POA: Diagnosis not present

## 2022-09-29 DIAGNOSIS — H524 Presbyopia: Secondary | ICD-10-CM | POA: Diagnosis not present

## 2022-09-29 LAB — HM DIABETES EYE EXAM

## 2022-10-01 LAB — HM DIABETES EYE EXAM

## 2022-10-11 ENCOUNTER — Ambulatory Visit: Payer: Self-pay | Admitting: *Deleted

## 2022-10-11 NOTE — Patient Outreach (Signed)
  Care Coordination   10/11/2022 Name: Katherine Stevens MRN: [id number] DOB: 05-01-1945   Care Coordination Outreach Attempts:  An unsuccessful telephone outreach was attempted for a scheduled appointment today.  Follow Up Plan:  Additional outreach attempts will be made to offer the patient care coordination information and services.   Encounter Outcome:  No Answer   Care Coordination Interventions:  No, not indicated    Page Pucciarelli, LCSW Clinical Social Ecologist [facility] Care Management [phone]

## 2022-10-13 ENCOUNTER — Telehealth: Payer: Self-pay | Admitting: *Deleted

## 2022-10-14 NOTE — Patient Instructions (Signed)
Visit Information  Thank you for taking time to visit with me today. Please don't hesitate to contact me if I can be of assistance to you.   Following are the goals we discussed today:  Please bring in your completed Advanced Directive to be reviewed while at provider's office   Our next appointment is by telephone on 10/15/22 at 2pm  Please call the care guide team at [phone] if you need to cancel or reschedule your appointment.   If you are experiencing a Mental Health or Behavioral Health Crisis or need someone to talk to, please call [phone]   Patient verbalizes understanding of instructions and care plan provided today and agrees to view in MyChart. Active MyChart status and patient understanding of how to access instructions and care plan via MyChart confirmed with patient.     Face to Face appointment with care management team member scheduled for:  10/15/22  Verna Czech, LCSW Clinical Social Worker  [facility] Management [phone]

## 2022-10-14 NOTE — Patient Outreach (Signed)
  Care Coordination   Follow Up Visit Note   10/14/2022 Late Entry Name: Ailene Royal MRN: [id number] DOB: [birth year]  Katherine Stevens is a 77 y.o. year old female who sees Danelle Berry, [place] for primary care. I spoke with  [name] by phone on 10/13/22  What matters to the patients health and wellness today?  Completion of Advanced Directive    Goals Addressed             This Visit's Progress    Patient would like her advanced directives completed       Interventions Today    Flowsheet Row Most Recent Value  Chronic Disease   Chronic disease during today's visit Other  [vascular dementia]  General Interventions   General Interventions Discussed/Reviewed General Interventions Reviewed  Advanced Directive Interventions   Advanced Directives Discussed/Reviewed Advanced Directives Discussed, Provided resource for acquiring and filling out documents  [patient confirms that she has received the Advanced Directives and has completed the form, however would like additional assistance to ensure document is completed]              SDOH assessments and interventions completed:  No     Care Coordination Interventions:  Yes, provided   Follow up plan: Follow up call scheduled for 10/14/22    Encounter Outcome:  Pt. Visit Completed

## 2022-10-15 ENCOUNTER — Ambulatory Visit (INDEPENDENT_AMBULATORY_CARE_PROVIDER_SITE_OTHER): Payer: Medicare Other | Admitting: Family Medicine

## 2022-10-15 ENCOUNTER — Ambulatory Visit: Payer: Self-pay | Admitting: *Deleted

## 2022-10-15 ENCOUNTER — Encounter: Payer: Self-pay | Admitting: Family Medicine

## 2022-10-15 VITALS — BP 126/64 | HR 96 | Temp 99.0°F | Resp 16 | Ht 66.5 in | Wt 229.0 lb

## 2022-10-15 DIAGNOSIS — E559 Vitamin D deficiency, unspecified: Secondary | ICD-10-CM

## 2022-10-15 DIAGNOSIS — E119 Type 2 diabetes mellitus without complications: Secondary | ICD-10-CM

## 2022-10-15 DIAGNOSIS — N3281 Overactive bladder: Secondary | ICD-10-CM

## 2022-10-15 DIAGNOSIS — Z7189 Other specified counseling: Secondary | ICD-10-CM

## 2022-10-15 DIAGNOSIS — E538 Deficiency of other specified B group vitamins: Secondary | ICD-10-CM | POA: Diagnosis not present

## 2022-10-15 DIAGNOSIS — K219 Gastro-esophageal reflux disease without esophagitis: Secondary | ICD-10-CM

## 2022-10-15 DIAGNOSIS — N1831 Chronic kidney disease, stage 3a: Secondary | ICD-10-CM

## 2022-10-15 DIAGNOSIS — J302 Other seasonal allergic rhinitis: Secondary | ICD-10-CM

## 2022-10-15 DIAGNOSIS — F17200 Nicotine dependence, unspecified, uncomplicated: Secondary | ICD-10-CM

## 2022-10-15 DIAGNOSIS — F17210 Nicotine dependence, cigarettes, uncomplicated: Secondary | ICD-10-CM | POA: Diagnosis not present

## 2022-10-15 DIAGNOSIS — I10 Essential (primary) hypertension: Secondary | ICD-10-CM

## 2022-10-15 DIAGNOSIS — F0150 Vascular dementia without behavioral disturbance: Secondary | ICD-10-CM

## 2022-10-15 DIAGNOSIS — F320 Major depressive disorder, single episode, mild: Secondary | ICD-10-CM | POA: Diagnosis not present

## 2022-10-15 DIAGNOSIS — F419 Anxiety disorder, unspecified: Secondary | ICD-10-CM

## 2022-10-15 DIAGNOSIS — E039 Hypothyroidism, unspecified: Secondary | ICD-10-CM

## 2022-10-15 DIAGNOSIS — G309 Alzheimer's disease, unspecified: Secondary | ICD-10-CM

## 2022-10-15 DIAGNOSIS — E785 Hyperlipidemia, unspecified: Secondary | ICD-10-CM | POA: Diagnosis not present

## 2022-10-15 NOTE — Assessment & Plan Note (Addendum)
Well controlled with 2nd gen antihistamine

## 2022-10-15 NOTE — Patient Outreach (Signed)
  Care Coordination   Follow Up Visit Note   10/15/2022 Name: Katherine Stevens MRN: [id number] DOB: 10-02-1945  Katherine Stevens is a 77 y.o. year old female who sees Danelle Berry, [place] for primary care. I visited  Katherine Stevens in their home today.  What matters to the patients health and wellness today?  Advanced Directives completion. Discussed that forms now need to be notarized    Goals Addressed             This Visit's Progress    Patient would like her advanced directives completed       Interventions Today    Flowsheet Row Most Recent Value  Chronic Disease   Chronic disease during today's visit Other  [vascular dementia]  General Interventions   General Interventions Discussed/Reviewed General Interventions Reviewed  Advanced Directive Interventions   Advanced Directives Discussed/Reviewed Advanced Directives Reviewed  [patient requested assistance to complete Advanced Directive documents, previously reviewed by phone but wanted it reviewed in person]              SDOH assessments and interventions completed:  No     Care Coordination Interventions:  Yes, provided   Follow up plan: Follow up call scheduled for 11/19/22    Encounter Outcome:  Pt. Visit Completed

## 2022-10-15 NOTE — Assessment & Plan Note (Signed)
Has been well controlled on atorvastatin 40 mg, labs done recently Good med compliance Lab Results  Component Value Date   CHOL 101 05/17/2022   HDL 53 05/17/2022   LDLCALC 35 05/17/2022   TRIG 54 05/17/2022   CHOLHDL 1.9 05/17/2022

## 2022-10-15 NOTE — Assessment & Plan Note (Signed)
Has been stable and well controlled DM:   Pt managing DM with metformin 500 mg once daily only and diet/lifestyle Reports good med compliance Pt has no SE from meds. Blood sugars not checking Denies: Polyuria, polydipsia, vision changes, neuropathy, hypoglycemia Recent pertinent labs: Lab Results  Component Value Date   HGBA1C 6.6 (H) 05/17/2022   HGBA1C 6.6 (H) 10/08/2021   HGBA1C 6.3 (A) 04/20/2021   Lab Results  Component Value Date   MICROALBUR 0.5 11/19/2021   LDLCALC 35 05/17/2022   CREATININE 0.96 05/17/2022   Standard of care and health maintenance: Urine Microalbumin: utd Foot exam:  done today DM eye exam:  UTD getting records ACEI/ARB:  yes Statin:  yes

## 2022-10-15 NOTE — Assessment & Plan Note (Addendum)
Weight down a little from last documented bmi Multiple well controlled comorbidities - including HTN, HLD, T2DM, GERD, severe OA affecting mobility Wt Readings from Last 5 Encounters:  10/15/22 229 lb (103.9 kg)  07/23/22 231 lb (104.8 kg)  07/16/22 231 lb 14.4 oz (105.2 kg)  07/06/22 232 lb 3.2 oz (105.3 kg)  05/17/22 231 lb 1.6 oz (104.8 kg)   BMI Readings from Last 5 Encounters:  10/15/22 36.41 kg/m  07/23/22 37.28 kg/m  07/16/22 36.87 kg/m  07/06/22 36.92 kg/m  05/17/22 36.74 kg/m

## 2022-10-15 NOTE — Assessment & Plan Note (Signed)
On aricept Working with neurology at [facility]

## 2022-10-15 NOTE — Assessment & Plan Note (Signed)
Lab Results  Component Value Date   VITAMINB12 383 05/17/2022

## 2022-10-15 NOTE — Assessment & Plan Note (Signed)
Last vitamin D Lab Results  Component Value Date   VD25OH 21 (L) 10/09/2020

## 2022-10-15 NOTE — Progress Notes (Signed)
Name: Katherine Stevens   MRN: [id number]    DOB: Aug 07, 1945   Date:10/15/2022       Progress Note  Chief Complaint  Patient presents with   Follow-up   Hypertension   Hyperlipidemia   Diabetes    Subjective:   Katherine Stevens is a 77 y.o. female, presents to clinic for routine f/up HTN managed on losartan BP Readings from Last 3 Encounters:  10/15/22 126/64  07/16/22 136/76  07/06/22 130/66  Well controlled  HLD on atorvastatin 40 Lab Results  Component Value Date   CHOL 101 05/17/2022   HDL 53 05/17/2022   LDLCALC 35 05/17/2022   TRIG 54 05/17/2022   CHOLHDL 1.9 05/17/2022   DM well controlled on metformin 500 mg once a day Lab Results  Component Value Date   HGBA1C 6.6 (H) 05/17/2022  Dm foot exam done today, she got her eye exam done  Thyroid disease has been difficult to manage Last dose of levothyroxine was 88 mcg daily- last labs normal Lab Results  Component Value Date   TSH 0.67 05/17/2022   OAB well controlled with oxybutynin  Intermittent GERD sx she will use PPI prn for flares  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 Sherryll Burger  Allergies - season on zyrtec  Smoking, she is on wellbutrin for smoking, smokes 1/2 cig here and there throughout the day but has never been able to completely stop smoking  Sometimes she has been anxious or down and depressed, she seems to do well when her thyroid meds are optimized (euthyroid) on wellbutrin in the am and second dose no later than around noon, and on celexa    10/15/2022    1:47 PM 07/23/2022    2:36 PM 07/16/2022    2:07 PM  Depression screen PHQ 2/9  Decreased Interest 0 0 0  Down, Depressed, Hopeless 0 0 0  PHQ - 2 Score 0 0 0  Altered sleeping 0 0 0  Tired, decreased energy 0 0 0  Change in appetite 0 0 0  Feeling bad or failure about yourself  0 0 0   Trouble concentrating 0 0 0  Moving slowly or fidgety/restless 0 0 0  Suicidal thoughts 0 0 0  PHQ-9 Score 0 0 0  Difficult doing work/chores Not difficult at all Not difficult at all Not difficult at all      01/06/2022    3:06 PM 10/08/2021    2:53 PM 07/20/2021    1:36 PM 04/20/2021   11:27 AM  GAD 7 : Generalized Anxiety Score  Nervous, Anxious, on Edge 0 0 0 0  Control/stop worrying 0 0 0 0  Worry too much - different things 0 0 0 0  Trouble relaxing 0 0 0 0  Restless 0 0 0 0  Easily annoyed or irritable 0 0 0 0  Afraid - awful might happen 0 0 0 0  Total GAD 7 Score 0 0 0 0  Anxiety Difficulty Not difficult at all Not difficult at all Not difficult at all Not difficult at all        Current Outpatient Medications:    atorvastatin (LIPITOR) 40 MG tablet, TAKE 1 TABLET(40 MG) BY MOUTH AT BEDTIME FOR CHOLESTEROL, Disp: 90 tablet, Rfl: 2   buPROPion (WELLBUTRIN) 75 MG tablet, TAKE 1 TABLET(75 MG) BY MOUTH WITH BREAKFAST  AND WITH LUNCH FOR MOOD OR SMOKING CESSATION, Disp: 180 tablet, Rfl: 1   Cholecalciferol (VITAMIN D3) 50 MCG (2000 UT) capsule, Take 1 capsule (2,000 Units total) by mouth daily., Disp: 90 capsule, Rfl: 3   citalopram (CELEXA) 20 MG tablet, Take 1 tablet (20 mg total) by mouth in the morning., Disp: 90 tablet, Rfl: 3   cyanocobalamin (VITAMIN B12) 500 MCG tablet, Take 1 tablet (500 mcg total) by mouth daily., Disp: 90 tablet, Rfl: 1   donepezil (ARICEPT) 10 MG tablet, TAKE 1 TABLET(10 MG) BY MOUTH AT BEDTIME, Disp: 90 tablet, Rfl: 0   levocetirizine (XYZAL) 5 MG tablet, Take 1 tablet (5 mg total) by mouth every evening., Disp: 90 tablet, Rfl: 1   levothyroxine (SYNTHROID) 88 MCG tablet, TAKE 1 TABLET(88 MCG) BY MOUTH DAILY BEFORE BREAKFAST FOR THYROID, Disp: 90 tablet, Rfl: 0   losartan (COZAAR) 50 MG tablet, Take 1 tablet (50 mg total) by mouth daily. For blood pressure, Disp: 90 tablet, Rfl: 1   metFORMIN (GLUCOPHAGE) 500 MG tablet, Take 1 tablet (500 mg total)  by mouth daily with breakfast., Disp: 90 tablet, Rfl: 1   oxybutynin (DITROPAN-XL) 10 MG 24 hr tablet, TAKE 1 TABLET(5 MG) BY MOUTH DAILY FOR OVERACTIVE BLADDER, Disp: 90 tablet, Rfl: 1   pantoprazole (PROTONIX) 20 MG tablet, TAKE 1 TABLET(20 MG) BY MOUTH AT BEDTIME FOR GERD OR ABDOMINAL SYMPTOMS, Disp: 90 tablet, Rfl: 1   QUEtiapine (SEROQUEL) 25 MG tablet, TAKE 1 TABLET(25 MG) BY MOUTH AT BEDTIME AS NEEDED FOR INSOMNIA, Disp: 90 tablet, Rfl: 1   Travoprost, BAK Free, (TRAVATAN) 0.004 % SOLN ophthalmic solution, 1 drop at bedtime., Disp: , Rfl:   Patient Active Problem List   Diagnosis Date Noted   Stage 3a chronic kidney disease (HCC) 05/17/2022   B12 deficiency 05/17/2022   Vitamin D deficiency 11/20/2020   Insomnia due to other mental disorder 05/01/2020   OAB (overactive bladder) 05/01/2020   Osteopenia 05/01/2020   History of hepatitis C 05/01/2020   Abnormality of gait and mobility 05/01/2020   Current mild episode of major depressive disorder (HCC) 04/16/2019   Type 2 diabetes mellitus without complication, without long-term current use of insulin (HCC) 04/16/2019   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/16/2019   Folate deficiency 04/16/2019   Vitamin B1 deficiency 04/16/2019   GERD (gastroesophageal reflux disease) 11/22/2018   Tobacco dependence 12/12/2015   Hyperlipidemia 12/10/2014   Osteoarthritis of both knees 08/01/2012   Seasonal allergies 07/28/2012   Class 2 severe obesity with serious comorbidity and body mass index (BMI) of 37.0 to 37.9 in adult (HCC) 07/29/2011   Hypothyroidism 12/20/2008   Hypertension, benign 12/20/2008    Past Surgical History:  Procedure Laterality Date   ABDOMINAL HYSTERECTOMY      Family History  Problem Relation Age of Onset   Heart failure Mother    Diabetes Mother    Diabetes Sister    Hyperlipidemia Sister    Thyroid disease Sister    Stroke Brother    Heart attack Brother    Cancer Maternal Grandmother    Heart  attack Brother     Social History   Tobacco Use   Smoking status: Every Day    Current packs/day: 0.25    Types: Cigarettes   Smokeless tobacco: Never   Tobacco comments:    Reports smoking approximately 5-6 cigarettes a day  Vaping Use   Vaping status: Never Used  Substance Use Topics   Alcohol use: Not Currently  Drug use: Not Currently     Allergies  Allergen Reactions   Lisinopril Anaphylaxis    MOUTH & JAW SWELLING    Health Maintenance  Topic Date Due   DTaP/Tdap/Td (1 - Tdap) Never done   FOOT EXAM  10/09/2022   OPHTHALMOLOGY EXAM  10/15/2022 (Originally 03/18/2022)   COVID-19 Vaccine (6 - 2023-24 season) 10/31/2022 (Originally 04/08/2022)   INFLUENZA VACCINE  11/04/2022   HEMOGLOBIN A1C  11/15/2022   Diabetic kidney evaluation - Urine ACR  11/20/2022   Diabetic kidney evaluation - eGFR measurement  05/18/2023   MAMMOGRAM  07/05/2023   Medicare Annual Wellness (AWV)  07/23/2023   DEXA SCAN  07/04/2024   Pneumonia Vaccine 66+ Years old  Completed   HPV VACCINES  Aged Out   Hepatitis C Screening  Discontinued   Zoster Vaccines- Shingrix  Discontinued    Chart Review Today: I personally reviewed active problem list, medication list, allergies, family history, social history, health maintenance, notes from last encounter, lab results, imaging with the patient/caregiver today.   Review of Systems  Constitutional: Negative.   HENT: Negative.    Eyes: Negative.   Respiratory: Negative.    Cardiovascular: Negative.   Gastrointestinal: Negative.   Endocrine: Negative.   Genitourinary: Negative.   Musculoskeletal: Negative.   Skin: Negative.   Allergic/Immunologic: Negative.   Neurological: Negative.   Hematological: Negative.   Psychiatric/Behavioral: Negative.    All other systems reviewed and are negative.    Objective:   Vitals:   10/15/22 1347  BP: 126/64  Pulse: 96  Resp: 16  Temp: 99 F (37.2 C)  TempSrc: Oral  SpO2: 99%  Weight: 229 lb  (103.9 kg)  Height: 5' 6.5" (1.689 m)    Body mass index is 36.41 kg/m.  Physical Exam Vitals and nursing note reviewed.  Constitutional:      General: She is not in acute distress.    Appearance: Normal appearance. She is obese. She is not ill-appearing, toxic-appearing or diaphoretic.  HENT:     Head: Normocephalic and atraumatic.     Right Ear: External ear normal.     Left Ear: External ear normal.  Eyes:     General: No scleral icterus.       Right eye: No discharge.        Left eye: No discharge.     Conjunctiva/sclera: Conjunctivae normal.  Cardiovascular:     Rate and Rhythm: Normal rate and regular rhythm.     Pulses: Normal pulses.     Heart sounds: Normal heart sounds. No murmur heard.    No friction rub. No gallop.  Pulmonary:     Effort: Pulmonary effort is normal. No respiratory distress.     Breath sounds: Normal breath sounds. No stridor. No wheezing, rhonchi or rales.  Musculoskeletal:     Right lower leg: No edema.     Left lower leg: No edema.  Skin:    General: Skin is warm and dry.     Coloration: Skin is not jaundiced or pale.     Findings: No lesion.  Neurological:     Mental Status: She is alert. Mental status is at baseline.     Gait: Gait abnormal.  Psychiatric:        Attention and Perception: Attention normal.        Mood and Affect: Mood and affect normal.        Speech: Speech normal.        Behavior: Behavior normal. Behavior  is cooperative.        Cognition and Memory: Memory is impaired.         Assessment & Plan:   Problem List Items Addressed This Visit       Cardiovascular and Mediastinum   Mixed Alzheimer's and vascular dementia (HCC)    On aricept Working with neurology at [facility]       Hypertension, benign - Primary    Stable and well controlled on losartan 50 mg once daily BP Readings from Last 3 Encounters:  10/15/22 126/64  07/16/22 136/76  07/06/22 130/66          Relevant Orders   COMPLETE  METABOLIC PANEL WITH GFR     Digestive   GERD (gastroesophageal reflux disease)    On PPI at bedtime when needed - have recommended she do 1-2 weeks when very flared up and wean off when able Can also use otc pepcid prn        Endocrine   Type 2 diabetes mellitus without complication, without long-term current use of insulin (HCC)    Has been stable and well controlled DM:   Pt managing DM with metformin 500 mg once daily only and diet/lifestyle Reports good med compliance Pt has no SE from meds. Blood sugars not checking Denies: Polyuria, polydipsia, vision changes, neuropathy, hypoglycemia Recent pertinent labs: Lab Results  Component Value Date   HGBA1C 6.6 (H) 05/17/2022   HGBA1C 6.6 (H) 10/08/2021   HGBA1C 6.3 (A) 04/20/2021   Lab Results  Component Value Date   MICROALBUR 0.5 11/19/2021   LDLCALC 35 05/17/2022   CREATININE 0.96 05/17/2022   Standard of care and health maintenance: Urine Microalbumin: utd Foot exam:  done today DM eye exam:  UTD getting records ACEI/ARB:  yes Statin:  yes        Relevant Orders   Hemoglobin A1c   COMPLETE METABOLIC PANEL WITH GFR   Hypothyroidism    Feels euthyroid, no mood, weight or energy concerns On 88 mcg levothyroxine daily - verified her dose today, will recheck labs to ensure TSH is still in normal range Lab Results  Component Value Date   TSH 0.67 05/17/2022   TSH 0.05 (L) 01/06/2022   TSH 0.01 (L) 11/19/2021   TSH 0.02 (L) 10/08/2021   TSH 0.01 (L) 07/20/2021   TSH 0.18 (L) 11/20/2020   TSH 0.02 (L) 10/09/2020   TSH 0.03 (L) 05/01/2020   TSH 0.01 (L) 01/31/2020   TSH 5.13 (H) 11/29/2019         Relevant Orders   TSH     Genitourinary   OAB (overactive bladder)    Sx stable and well controlled on oxybutinin      Stage 3a chronic kidney disease (HCC)    Renal function has been stable Lab Results  Component Value Date   EGFR 61 05/17/2022   EGFR 63 10/08/2021  Need to update to reflect CKD stage  2         Other   Class 2 severe obesity with serious comorbidity and body mass index (BMI) of 37.0 to 37.9 in adult (HCC)    Weight down a little from last documented bmi Multiple well controlled comorbidities - including HTN, HLD, T2DM, GERD, severe OA affecting mobility Wt Readings from Last 5 Encounters:  10/15/22 229 lb (103.9 kg)  07/23/22 231 lb (104.8 kg)  07/16/22 231 lb 14.4 oz (105.2 kg)  07/06/22 232 lb 3.2 oz (105.3 kg)  05/17/22 231 lb 1.6 oz (  104.8 kg)   BMI Readings from Last 5 Encounters:  10/15/22 36.41 kg/m  07/23/22 37.28 kg/m  07/16/22 36.87 kg/m  07/06/22 36.92 kg/m  05/17/22 36.74 kg/m         Relevant Orders   COMPLETE METABOLIC PANEL WITH GFR   CBC with Differential/Platelet   TSH   VITAMIN D 25 Hydroxy (Vit-D Deficiency, Fractures)   Seasonal allergies    Well controlled with 2nd gen antihistamine      Tobacco dependence    Smoking cessation discussed again today at length  Pt on wellbutrin to help decrease amount, but not interested in quitting completely Smoking cessation instruction/counseling given:  counseled patient on the dangers of tobacco use, advised patient to stop smoking, and reviewed strategies to maximize success More than 5 min spent discussing smoking cessation       Current mild episode of major depressive disorder (HCC)    Mood and anxiety stable and sx well controlled with celexa Phq 9 and gad 7 reviewed and neg - see in HPI      Hyperlipidemia    Has been well controlled on atorvastatin 40 mg, labs done recently Good med compliance Lab Results  Component Value Date   CHOL 101 05/17/2022   HDL 53 05/17/2022   LDLCALC 35 05/17/2022   TRIG 54 05/17/2022   CHOLHDL 1.9 05/17/2022         Relevant Orders   COMPLETE METABOLIC PANEL WITH GFR   Vitamin D deficiency    Last vitamin D Lab Results  Component Value Date   VD25OH 21 (L) 10/09/2020         Relevant Orders   COMPLETE METABOLIC PANEL WITH GFR    VITAMIN D 25 Hydroxy (Vit-D Deficiency, Fractures)   B12 deficiency    Lab Results  Component Value Date   VITAMINB12 383 05/17/2022         Relevant Orders   CBC with Differential/Platelet   Other Visit Diagnoses     Anxiety       see MDD above   Advance care planning       see below     A voluntary discussion about advance care planning including the explanation and discussion of advance directives was extensively discussed with the patient. Explanation about the health care proxy and Living will was reviewed and packet with forms with explanation of how to fill them out was given. During this discussion, the patient was able to identify a health care proxy as her sister and her son, and plans/does not plan to fill out the paperwork required. Patient was offered a separate Advance Care Planning visit for further assistance with forms Forms were given to the patient and reviewed today At this time she did verbally endorse wishing to stay full code and mentions she is working on other POA paperwork today - though she does not have this with her. More than 20 minutes was spent today with the patient in the exam room discussing and reviewing forms/ACP.   Return for 4 month routine f/up in office .   Danelle Berry, PA-C 10/15/22 2:00 PM

## 2022-10-15 NOTE — Assessment & Plan Note (Signed)
On PPI at bedtime when needed - have recommended she do 1-2 weeks when very flared up and wean off when able Can also use otc pepcid prn

## 2022-10-15 NOTE — Patient Instructions (Addendum)
Return to the office to do your labs in the next 3 week (Get done before August 12 th please) -   [facility] lab hours: Come into the office and tell the front desk that you are here for labs Come in anytime we are open, Mon-Fri 8a m-11:30 am or 1:30 pm-3:30 pm

## 2022-10-15 NOTE — Assessment & Plan Note (Signed)
Feels euthyroid, no mood, weight or energy concerns On 88 mcg levothyroxine daily - verified her dose today, will recheck labs to ensure TSH is still in normal range Lab Results  Component Value Date   TSH 0.67 05/17/2022   TSH 0.05 (L) 01/06/2022   TSH 0.01 (L) 11/19/2021   TSH 0.02 (L) 10/08/2021   TSH 0.01 (L) 07/20/2021   TSH 0.18 (L) 11/20/2020   TSH 0.02 (L) 10/09/2020   TSH 0.03 (L) 05/01/2020   TSH 0.01 (L) 01/31/2020   TSH 5.13 (H) 11/29/2019

## 2022-10-15 NOTE — Assessment & Plan Note (Signed)
Sx stable and well controlled on oxybutinin

## 2022-10-15 NOTE — Assessment & Plan Note (Signed)
Smoking cessation discussed again today at length  Pt on wellbutrin to help decrease amount, but not interested in quitting completely Smoking cessation instruction/counseling given:  counseled patient on the dangers of tobacco use, advised patient to stop smoking, and reviewed strategies to maximize success More than 5 min spent discussing smoking cessation

## 2022-10-15 NOTE — Patient Instructions (Signed)
Visit Information  Thank you for taking time to visit with me today. Please don't hesitate to contact me if I can be of assistance to you.   Following are the goals we discussed today:  Please review Advanced Directive and work on getting the forms notarized and scanned in to the chart   Our next appointment is by telephone on 11/15/22 at 11am  Please call the care guide team at [phone] if you need to cancel or reschedule your appointment.   If you are experiencing a Mental Health or Behavioral Health Crisis or need someone to talk to, please call 911   Patient verbalizes understanding of instructions and care plan provided today and agrees to view in MyChart. Active MyChart status and patient understanding of how to access instructions and care plan via MyChart confirmed with patient.     Telephone follow up appointment with care management team member scheduled for: 11/15/22  Verna Czech, LCSW Clinical Social Worker  [facility] Care Management [phone]

## 2022-10-15 NOTE — Assessment & Plan Note (Signed)
Stable and well controlled on losartan 50 mg once daily BP Readings from Last 3 Encounters:  10/15/22 126/64  07/16/22 136/76  07/06/22 130/66

## 2022-10-15 NOTE — Assessment & Plan Note (Signed)
Mood and anxiety stable and sx well controlled with celexa Phq 9 and gad 7 reviewed and neg - see in HPI

## 2022-10-15 NOTE — Assessment & Plan Note (Signed)
Renal function has been stable Lab Results  Component Value Date   EGFR 61 05/17/2022   EGFR 63 10/08/2021  Need to update to reflect CKD stage 2

## 2022-11-02 NOTE — Patient Instructions (Signed)
Thank you for allowing the Chronic Care Management team to participate in your care. It was great speaking with you!  Please call the care guide team at [phone] if you need to cancel or reschedule your appointment.   If you are experiencing a Mental Health or Behavioral Health Crisis or need someone to talk to, please  -Call the Suicide and Crisis Lifeline: 988 -Go to [facility] [address], [place] [phone]) -Call 911   Following is a copy of the CCM Program Consent:  CCM service includes personalized support from designated clinical staff supervised by the physician, including individualized plan of care and coordination with other care providers 24/7 contact phone numbers for assistance for urgent and routine care needs. Service will only be billed when office clinical staff spend 20 minutes or more in a month to coordinate care. Only one practitioner may furnish and bill the service in a calendar month. The patient may stop CCM services at amy time (effective at the end of the month) by phone call to the office staff. The patient will be responsible for cost sharing (co-pay) or up to 20% of the service fee (after annual deductible is met)  Following is a copy of your full provider care plan:   Goals Addressed             This Visit's Progress    Goal: CCM (Diabetes) Expected Outcome:  Monitor, Self-Manage And Reduce Symptoms of Diabetes       Current Barriers:  Chronic Disease Management support and education needs related to Diabetes  Planned Interventions: Reviewed provider's plan for diabetes management.  Reviewed medication. Reports taking metformin as prescribed. Denies current concerns regarding medication management.  Discussed importance of blood glucose readings. Reports not monitoring daily. Only monitoring fasting levels once a week. Advised to monitor levels daily and record readings.  Provided information regarding  hypoglycemia and hyperglycemia along with appropriate interventions. Denies recent hypoglycemic or hyperglycemic episodes. Discussed nutritional intake and importance of complying with a diabetic diet. Attempting to adhere to recommended diet and monitoring intake of added sugars. Reports increasing intake of fruits, vegetables and lean meats. Reports eating dumplings regularly which tend to have more carbs. Thorough discussion regarding low carb options. Encouraged to notify the team if additional referral for nutrition/dietician is needed. Discussed importance of completing recommended DM preventive care. Reports completing daily foot care as advised. Reports eye is exam is up to date. Reports pending an update eye exam within the next few months.   Discussed importance of completing ordered labs as prescribed.  Assessed social determinant of health barriers.    Lab Results  Component Value Date   HGBA1C 6.6 (H) 05/17/2022      Symptom Management: Attend all scheduled provider appointments Continue taking metformin as prescribed Attend eye exam as scheduled Monitor blood glucose and maintain a log Check feet daily for cuts, sores or redness Wash and dry feet carefully every day Wear comfortable, cotton socks Wear comfortable, well-fitting shoes Read food labels for fat, fiber, carbohydrates and portion size Call provider office for new concerns or questions     Follow Up Plan:  Will follow up for update in three months.     Goal: CCM (Hypertension) Expected Outcome:  Monitor, Self-Manage And Reduce Symptoms of Hypertension       Current Barriers:  Chronic Disease Management support and education needs related to Hypertension  Planned Interventions: Reviewed current treatment plan related to Hypertension, self-management, and adherence  to plan as established by provider.  Reviewed medications and indications for use. Reports taking medications as prescribed. Denies concerns r/t  medication management or prescription cost. Provided information regarding established blood pressure parameters along with indications for notifying a provider. Reports not currently monitoring daily. Advised to monitor BP a few times a week if unable to monitor daily and record readings.  Reviewed symptoms. Denies chest pain or palpitations. Denies headaches, dizziness, or visual changes.  Discussed compliance with recommended cardiac prudent diet. Encouraged to read nutrition labels, continue monitoring sodium intake, and avoid highly processed foods when possible.  Reviewed s/sx of heart attack, stroke and worsening symptoms that require immediate medical attention.    BP Readings from Last 3 Encounters:  07/06/22 130/66  05/17/22 132/80  01/06/22 132/76     Symptom Management: Take all medications as prescribed Attend all scheduled provider appointments Call pharmacy for medication refills 3-7 days in advance of running out of medications Check blood pressure and keep a log Call doctor for signs and symptoms of high blood pressure Continue engaging in exercises and mild activity as tolerated Read nutrition labels. Eat whole grains, fruits and vegetables, lean meats and healthy fats Limit salt intake  Call provider office for new concerns or questions    Follow Up Plan:  Will follow up for update in three months.        Patient verbalizes understanding of instructions and care plan provided today and agrees to view in MyChart.   A member of the care management team will follow up in three months.  [facility]/Chronic Care Management [phone]

## 2022-11-08 ENCOUNTER — Other Ambulatory Visit: Payer: Self-pay | Admitting: Family Medicine

## 2022-11-08 DIAGNOSIS — F419 Anxiety disorder, unspecified: Secondary | ICD-10-CM

## 2022-11-08 DIAGNOSIS — F320 Major depressive disorder, single episode, mild: Secondary | ICD-10-CM

## 2022-11-09 ENCOUNTER — Other Ambulatory Visit: Payer: Self-pay | Admitting: Family Medicine

## 2022-11-09 DIAGNOSIS — I10 Essential (primary) hypertension: Secondary | ICD-10-CM

## 2022-11-09 DIAGNOSIS — E119 Type 2 diabetes mellitus without complications: Secondary | ICD-10-CM

## 2022-11-15 ENCOUNTER — Ambulatory Visit: Payer: Self-pay | Admitting: *Deleted

## 2022-11-15 DIAGNOSIS — E559 Vitamin D deficiency, unspecified: Secondary | ICD-10-CM | POA: Diagnosis not present

## 2022-11-15 DIAGNOSIS — E119 Type 2 diabetes mellitus without complications: Secondary | ICD-10-CM | POA: Diagnosis not present

## 2022-11-15 DIAGNOSIS — I10 Essential (primary) hypertension: Secondary | ICD-10-CM | POA: Diagnosis not present

## 2022-11-15 DIAGNOSIS — E785 Hyperlipidemia, unspecified: Secondary | ICD-10-CM | POA: Diagnosis not present

## 2022-11-15 DIAGNOSIS — E039 Hypothyroidism, unspecified: Secondary | ICD-10-CM | POA: Diagnosis not present

## 2022-11-15 NOTE — Patient Instructions (Signed)
Visit Information  Thank you for taking time to visit with me today. Please don't hesitate to contact me if I can be of assistance to you.   Following are the goals we discussed today:  Please have Advanced Directive documents notarized when able and provide to your providers office to be scanned in to your chart.   If you are experiencing a Mental Health or Behavioral Health Crisis or need someone to talk to, please call 911   Patient verbalizes understanding of instructions and care plan provided today and agrees to view in MyChart. Active MyChart status and patient understanding of how to access instructions and care plan via MyChart confirmed with patient.     No further follow up required: please call this social worker with any additional community resource needs.   Verna Czech, LCSW Clinical Social Worker  [facility] Care Management [phone]

## 2022-11-15 NOTE — Patient Outreach (Signed)
  Care Coordination   Initial Visit Note   11/15/2022 Name: Katherine Stevens MRN: [id number] DOB: 12-22-1945  Katherine Stevens is a 77 y.o. year old female who sees Danelle Berry, [place] for primary care. I spoke with  [name] by phone today.  What matters to the patients health and wellness today?  Patient verbalized having no additional community resource needs at this time. Advance Directives completed, patient will need to get the document notarized-will plan to go to her bank and will bring them back to her providers office to be scanned in to her chart.    Goals Addressed             This Visit's Progress    Patient would like her advanced directives completed       Interventions Today    Flowsheet Row Most Recent Value  Chronic Disease   Chronic disease during today's visit Other  [dementia]  General Interventions   General Interventions Discussed/Reviewed General Interventions Discussed, Community Resources  Advanced Directive Interventions   Advanced Directives Discussed/Reviewed Advanced Directives Discussed  [AD completed, however documents will need to be notarized, there is no notary available at the patient's practice, she is agreeable to taking the documents to her back to get them notarized-will bring them back to provider's office to be scanned to chart]              SDOH assessments and interventions completed:  No     Care Coordination Interventions:  Yes, provided   Follow up plan: No further intervention required.   Encounter Outcome:  Pt. Visit Completed

## 2022-11-16 ENCOUNTER — Other Ambulatory Visit: Payer: Self-pay | Admitting: Family Medicine

## 2022-11-16 DIAGNOSIS — F419 Anxiety disorder, unspecified: Secondary | ICD-10-CM

## 2022-11-16 DIAGNOSIS — F320 Major depressive disorder, single episode, mild: Secondary | ICD-10-CM

## 2022-11-16 MED ORDER — CITALOPRAM HYDROBROMIDE 20 MG PO TABS
20.0000 mg | ORAL_TABLET | Freq: Every day | ORAL | 1 refills | Status: DC
Start: 2022-11-16 — End: 2023-05-24

## 2022-11-16 NOTE — Telephone Encounter (Signed)
Medication Refill - Medication: citalopram (CELEXA) 20 MG tablet   Pt is upset; she stated the pharmacy does not have her medication and she has been out of her medication. I called the pharmacy for pt and was advised they do not have it.  Please advise.  Pt requested a callback.  Has the patient contacted their pharmacy? Yes.    (Agent: If yes, when and what did the pharmacy advise?)  Preferred Pharmacy (with phone number or street name):  [facility][id number] Cheree Ditto, [place] - [address] AT [facility] [address]  [address] [place] [address]  Phone: [phone] Fax: [phone]  Hours: Not open 24 hours   Has the patient been seen for an appointment in the last year OR does the patient have an upcoming appointment? Yes.    Agent: Please be advised that RX refills may take up to 3 business days. We ask that you follow-up with your pharmacy.

## 2022-11-16 NOTE — Telephone Encounter (Signed)
Requested Prescriptions  Pending Prescriptions Disp Refills   citalopram (CELEXA) 20 MG tablet 90 tablet 1    Sig: Take 1 tablet (20 mg total) by mouth daily.     Psychiatry:  Antidepressants - SSRI Passed - 11/16/2022  3:07 PM      Passed - Completed PHQ-2 or PHQ-9 in the last 360 days      Passed - Valid encounter within last 6 months    Recent Outpatient Visits           1 month ago Hypertension, benign   [facility] Dan[facility] Berry, PA-C   4 months ago Hypothyroidism, unspecified type   [facility] Danelle Berry, PA-C   4 months ago Care plan discussed with patient   [facility] Mecum, Erin E, PA-C   6 months ago Type 2 diabetes mellitus without complication, without long-term current use of insulin [facility])   [place] [facility] Danelle Berry, PA-C   10 months ago Hypothyroidism, unspecified type   [facility] Danelle Berry, PA-C       Future Appointments             In 3 months Danelle Berry, PA-C [facility]

## 2022-11-22 ENCOUNTER — Other Ambulatory Visit: Payer: Self-pay | Admitting: Family Medicine

## 2022-11-22 DIAGNOSIS — E039 Hypothyroidism, unspecified: Secondary | ICD-10-CM

## 2022-11-22 MED ORDER — LEVOTHYROXINE SODIUM 75 MCG PO TABS
75.0000 ug | ORAL_TABLET | Freq: Every day | ORAL | 0 refills | Status: DC
Start: 2022-11-22 — End: 2023-03-02

## 2022-12-09 ENCOUNTER — Other Ambulatory Visit: Payer: Self-pay | Admitting: Family Medicine

## 2022-12-09 DIAGNOSIS — K219 Gastro-esophageal reflux disease without esophagitis: Secondary | ICD-10-CM

## 2022-12-10 ENCOUNTER — Other Ambulatory Visit: Payer: Self-pay

## 2022-12-10 DIAGNOSIS — J302 Other seasonal allergic rhinitis: Secondary | ICD-10-CM

## 2022-12-14 MED ORDER — LEVOCETIRIZINE DIHYDROCHLORIDE 5 MG PO TABS
5.0000 mg | ORAL_TABLET | Freq: Every evening | ORAL | 1 refills | Status: DC
Start: 2022-12-14 — End: 2023-05-24

## 2022-12-23 ENCOUNTER — Other Ambulatory Visit: Payer: Self-pay | Admitting: Family Medicine

## 2022-12-23 DIAGNOSIS — K219 Gastro-esophageal reflux disease without esophagitis: Secondary | ICD-10-CM

## 2022-12-23 NOTE — Telephone Encounter (Signed)
Resend to [facility], pharmacy didn't receive medication  on 12/09/22.

## 2022-12-23 NOTE — Telephone Encounter (Signed)
Medication Refill - Medication: pantoprazole (PROTONIX) 20 MG tablet   Has the patient contacted their pharmacy? Yes.   Pharmacy called with no reply, it was sent on 9/5 but pharmacy said they never recvd it  Preferred Pharmacy (with phone number or street name):  [facility] #[id number] Cheree Ditto, [place] - [address] AT [facility] OF [address] [facility] Phone: [phone]  Fax: [phone]     Has the patient been seen for an appointment in the last year OR does the patient have an upcoming appointment? Yes.    Agent: Please be advised that RX refills may take up to 3 business days. We ask that you follow-up with your pharmacy.

## 2022-12-24 MED ORDER — PANTOPRAZOLE SODIUM 20 MG PO TBEC
20.0000 mg | DELAYED_RELEASE_TABLET | Freq: Every day | ORAL | 0 refills | Status: DC
Start: 2022-12-24 — End: 2023-02-15

## 2022-12-24 NOTE — Telephone Encounter (Signed)
Requested Prescriptions  Pending Prescriptions Disp Refills   pantoprazole (PROTONIX) 20 MG tablet 90 tablet 0    Sig: Take 1 tablet (20 mg total) by mouth daily.     Gastroenterology: Proton Pump Inhibitors Passed - 12/23/2022 11:20 AM      Passed - Valid encounter within last 12 months    Recent Outpatient Visits           2 months ago Hypertension, benign   [facility] Dan[facility] Berry, PA-C   5 months ago Hypothyroidism, unspecified type   [facility] Dan[facility] Berry, PA-C   5 months ago Care plan discussed with patient   [facility] Mecum, Erin E, PA-C   7 months ago Type 2 diabetes mellitus without complication, without long-term current use of insulin [facility])   [place] [facility] Danelle Berry, PA-C   11 months ago Hypothyroidism, unspecified type   [facility] Danelle Berry, PA-C       Future Appointments             In 1 month Danelle Berry, PA-C [facility]

## 2023-01-07 ENCOUNTER — Telehealth: Payer: Self-pay

## 2023-01-07 NOTE — Patient Outreach (Signed)
  Care Management   Outreach Note  01/07/2023 Name: Katherine Stevens MRN: [id number] DOB: 1946/03/10  An unsuccessful telephone outreach was attempted today to contact the patient about Care Management needs.     Follow Up Plan:  A HIPAA compliant phone message was left for the patient providing contact information and requesting a return call.    [name]  [facility], [facility] RN Care Manager Direct Dial: [phone] Website: [email or web address]

## 2023-01-25 ENCOUNTER — Telehealth: Payer: Self-pay

## 2023-01-25 NOTE — Patient Outreach (Signed)
  Care Management   Outreach Note  01/25/2023 Name: Katherine Stevens MRN: [id number] DOB: 23-Feb-1946  An unsuccessful telephone outreach was attempted today to contact the patient about Care Management needs.     Follow Up Plan:  A HIPAA compliant phone message was left for the patient providing contact information and requesting a return call.    [name]  [facility], [facility] Health RN Care Manager Direct Dial: [phone] Website: [email or web address]

## 2023-01-29 ENCOUNTER — Other Ambulatory Visit: Payer: Self-pay | Admitting: Family Medicine

## 2023-01-29 DIAGNOSIS — F17200 Nicotine dependence, unspecified, uncomplicated: Secondary | ICD-10-CM

## 2023-01-29 DIAGNOSIS — F320 Major depressive disorder, single episode, mild: Secondary | ICD-10-CM

## 2023-01-29 DIAGNOSIS — Z716 Tobacco abuse counseling: Secondary | ICD-10-CM

## 2023-01-31 ENCOUNTER — Other Ambulatory Visit: Payer: Self-pay

## 2023-01-31 DIAGNOSIS — H401133 Primary open-angle glaucoma, bilateral, severe stage: Secondary | ICD-10-CM | POA: Diagnosis not present

## 2023-01-31 DIAGNOSIS — N3281 Overactive bladder: Secondary | ICD-10-CM

## 2023-01-31 MED ORDER — OXYBUTYNIN CHLORIDE ER 10 MG PO TB24
ORAL_TABLET | ORAL | 1 refills | Status: DC
Start: 2023-01-31 — End: 2023-07-29

## 2023-02-04 NOTE — Patient Outreach (Signed)
  Care Management   Outreach Note  02/04/2023 Name: Katherine Stevens MRN: [id number] DOB: Jul 05, 1945  Third unsuccessful telephone outreach was attempted today to contact the patient about Care Management needs. The care team will gladly assist if [name] requires future care management needs.   Follow Up Plan:  No further outreach required   [name]  [place], [facility] RN Care Manager Direct Dial: [phone] Website: [email or web address]

## 2023-02-15 ENCOUNTER — Encounter: Payer: Self-pay | Admitting: Physician Assistant

## 2023-02-15 ENCOUNTER — Ambulatory Visit (INDEPENDENT_AMBULATORY_CARE_PROVIDER_SITE_OTHER): Payer: Medicare Other | Admitting: Physician Assistant

## 2023-02-15 VITALS — BP 138/82 | HR 87 | Temp 97.1°F | Resp 16 | Ht 66.5 in | Wt 227.9 lb

## 2023-02-15 DIAGNOSIS — E1129 Type 2 diabetes mellitus with other diabetic kidney complication: Secondary | ICD-10-CM

## 2023-02-15 DIAGNOSIS — N1831 Chronic kidney disease, stage 3a: Secondary | ICD-10-CM

## 2023-02-15 DIAGNOSIS — G309 Alzheimer's disease, unspecified: Secondary | ICD-10-CM | POA: Diagnosis not present

## 2023-02-15 DIAGNOSIS — I10 Essential (primary) hypertension: Secondary | ICD-10-CM | POA: Diagnosis not present

## 2023-02-15 DIAGNOSIS — F0150 Vascular dementia without behavioral disturbance: Secondary | ICD-10-CM

## 2023-02-15 DIAGNOSIS — K219 Gastro-esophageal reflux disease without esophagitis: Secondary | ICD-10-CM | POA: Diagnosis not present

## 2023-02-15 DIAGNOSIS — E119 Type 2 diabetes mellitus without complications: Secondary | ICD-10-CM

## 2023-02-15 DIAGNOSIS — F99 Mental disorder, not otherwise specified: Secondary | ICD-10-CM

## 2023-02-15 DIAGNOSIS — F0280 Dementia in other diseases classified elsewhere without behavioral disturbance: Secondary | ICD-10-CM

## 2023-02-15 DIAGNOSIS — E039 Hypothyroidism, unspecified: Secondary | ICD-10-CM

## 2023-02-15 DIAGNOSIS — E785 Hyperlipidemia, unspecified: Secondary | ICD-10-CM | POA: Diagnosis not present

## 2023-02-15 DIAGNOSIS — F5105 Insomnia due to other mental disorder: Secondary | ICD-10-CM

## 2023-02-15 MED ORDER — METFORMIN HCL 500 MG PO TABS: 500.0000 mg | ORAL_TABLET | Freq: Every day | ORAL | 1 refills | Status: DC

## 2023-02-15 MED ORDER — PANTOPRAZOLE SODIUM 20 MG PO TBEC: 20.0000 mg | DELAYED_RELEASE_TABLET | Freq: Every day | ORAL | 1 refills | Status: DC

## 2023-02-15 MED ORDER — ATORVASTATIN CALCIUM 40 MG PO TABS: 40.0000 mg | ORAL_TABLET | Freq: Every day | ORAL | 1 refills | Status: DC

## 2023-02-15 MED ORDER — TRAZODONE HCL 50 MG PO TABS: 25.0000 mg | ORAL_TABLET | Freq: Every evening | ORAL | 0 refills | Status: DC | PRN

## 2023-02-15 NOTE — Patient Instructions (Signed)
Please stop taking the Quetiapine at night Instead I would like you to try a half tablet of Trazodone about 30 minutes before bed time to aid with falling asleep Please stop taking it if it makes you drowsy or confused the next day and let us know if you have concerns

## 2023-02-15 NOTE — Assessment & Plan Note (Signed)
Chronic, ongoing Currently taking Donepezil 10 mg PO every day Most recently seen by Neurology in June 2024 - recommend she live with someone else to assist with socialization, stay active and eat balanced diet with plenty of vegetables Would prefer to send scripts to a pharmacy that provides pill packing as she seems to have some confusion with medications and I am concerned for compliance. Offered to send scripts to [facility] pharmacy but she declined today Reviewed meds needing refills with her and those that have new scripts  Recommend she continues to see Neurology for monitoring - would like MMSE at next apt with PCP  Follow up in 3 months or sooner if concerns arise

## 2023-02-15 NOTE — Assessment & Plan Note (Signed)
Chronic, historic condition Appears well managed on current regimen comprised of Metformin 500 mg po every day  She reports checking her glucose levels a few times per week and denies readings <70 or >200  Continue current regimen. Most recent A1c was 6.6%- recheck today Results to dictate further management  Follow up in 3 months or sooner if concerns arise

## 2023-02-15 NOTE — Progress Notes (Signed)
Established Patient Office Visit  Name: Katherine Stevens   MRN: [id number]    DOB: 26-Jul-1945   Date:02/15/2023  Today's Provider: Jacquelin Hawking, MHS, PA-C Introduced myself to the patient as a PA-C and provided education on APPs in clinical practice.         Subjective  Chief Complaint  Chief Complaint  Patient presents with   Hypertension   Diabetes   Hyperlipidemia    HPI   Diabetes, Type 2 - Last A1c 6.6 % - Medications: Metformin 500 mg PO every day - Compliance: good  - Checking BG at home: checking about twice per week - she is not sure of avg  She denies any results lower than 70 and denies highs over 200  - Diet: She tries to reduce salt intake, she does use Splenda but is not trying to reduce sugar intake from other sources  - Exercise: She is not engaged in regular exercise  - Eye exam: UTD - Foot exam: UTD - Microalbumin: ordered today  - Statin: on statin  - PNA vaccine: Completed  - Denies symptoms of hypoglycemia, polyuria, polydipsia, numbness extremities, foot ulcers/trauma  HYPERTENSION / HYPERLIPIDEMIA Satisfied with current treatment? yes Duration of hypertension: years BP monitoring frequency: weekly BP range: 120s/unsure of diastolic  BP medication side effects: no Past BP meds: Losartan 50 mg PO every day Duration of hyperlipidemia: years Cholesterol medication side effects: no Cholesterol supplements: none Past cholesterol medications: atorvastain (lipitor) Medication compliance: good compliance Aspirin: no Recent stressors: no Recurrent headaches: no Visual changes: no Palpitations: no Dyspnea: no Chest pain: no Lower extremity edema: no Dizzy/lightheaded: no   HYPOTHYROIDISM Thyroid control status:stable Satisfied with current treatment? yes Medication side effects: no Medication compliance: good compliance Etiology of hypothyroidism:  Recent dose adjustment:yes- was decreased at last apt  Fatigue: yes Cold  intolerance: no Heat intolerance: yes Weight gain: no Weight loss: no Constipation: no Diarrhea/loose stools: no Palpitations: no Lower extremity edema: no Anxiety/depressed mood: no  Insomnia She only takes about a 0.25 tablet of her Quetiapine at night  She reports taking a whole tablet made her very sedated and she was afraid she wouldn't be able to wake up  She does want to discuss another option She reports concerns for falling asleep - she is not sure what keeps her awake  She states she will sometimes stay awake until 4 am if she does not take something for sleep  She reports getting 5-6 hours of sleep as of today    Patient Active Problem List   Diagnosis Date Noted   Stage 3a chronic kidney disease (HCC) 05/17/2022   B12 deficiency 05/17/2022   Vitamin D deficiency 11/20/2020   Insomnia due to other mental disorder 05/01/2020   OAB (overactive bladder) 05/01/2020   Osteopenia 05/01/2020   History of hepatitis C 05/01/2020   Abnormality of gait and mobility 05/01/2020   Current mild episode of major depressive disorder (HCC) 04/16/2019   Type 2 diabetes mellitus without complication, without long-term current use of insulin (HCC) 04/16/2019   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/16/2019   Folate deficiency 04/16/2019   Vitamin B1 deficiency 04/16/2019   GERD (gastroesophageal reflux disease) 11/22/2018   Tobacco dependence 12/12/2015   Hyperlipidemia 12/10/2014   Osteoarthritis of both knees 08/01/2012   Seasonal allergies 07/28/2012   Class 2 severe obesity with serious comorbidity and body mass index (BMI) of 37.0 to 37.9 in adult [facility]) 07/29/2011  Hypothyroidism 12/20/2008   Hypertension, benign 12/20/2008    Past Surgical History:  Procedure Laterality Date   ABDOMINAL HYSTERECTOMY      Family History  Problem Relation Age of Onset   Heart failure Mother    Diabetes Mother    Diabetes Sister    Hyperlipidemia Sister    Thyroid disease  Sister    Stroke Brother    Heart attack Brother    Cancer Maternal Grandmother    Heart attack Brother     Social History   Tobacco Use   Smoking status: Every Day    Current packs/day: 0.25    Types: Cigarettes   Smokeless tobacco: Never   Tobacco comments:    Reports smoking approximately 5-6 cigarettes a day  Substance Use Topics   Alcohol use: Not Currently     Current Outpatient Medications:    buPROPion (WELLBUTRIN) 75 MG tablet, TAKE 1 TABLET BY MOUTH WITH BREAKFAST AND WITH LUNCH FOR MOOD OR SMOKING CESSATION., Disp: 180 tablet, Rfl: 1   Cholecalciferol (VITAMIN D3) 50 MCG (2000 UT) capsule, Take 1 capsule (2,000 Units total) by mouth daily., Disp: 90 capsule, Rfl: 3   citalopram (CELEXA) 20 MG tablet, Take 1 tablet (20 mg total) by mouth daily., Disp: 90 tablet, Rfl: 1   cyanocobalamin (VITAMIN B12) 500 MCG tablet, Take 1 tablet (500 mcg total) by mouth daily., Disp: 90 tablet, Rfl: 1   donepezil (ARICEPT) 10 MG tablet, TAKE 1 TABLET(10 MG) BY MOUTH AT BEDTIME, Disp: 90 tablet, Rfl: 0   levocetirizine (XYZAL) 5 MG tablet, Take 1 tablet (5 mg total) by mouth every evening., Disp: 90 tablet, Rfl: 1   levothyroxine (SYNTHROID) 75 MCG tablet, Take 1 tablet (75 mcg total) by mouth daily before breakfast., Disp: 90 tablet, Rfl: 0   losartan (COZAAR) 50 MG tablet, TAKE 1 TABLET(50 MG) BY MOUTH DAILY FOR BLOOD PRESSURE, Disp: 90 tablet, Rfl: 1   oxybutynin (DITROPAN-XL) 10 MG 24 hr tablet, TAKE 1 TABLET(5 MG) BY MOUTH DAILY FOR OVERACTIVE BLADDER, Disp: 90 tablet, Rfl: 1   Travoprost, BAK Free, (TRAVATAN) 0.004 % SOLN ophthalmic solution, 1 drop at bedtime., Disp: , Rfl:    traZODone (DESYREL) 50 MG tablet, Take 0.5-1 tablets (25-50 mg total) by mouth at bedtime as needed for sleep., Disp: 30 tablet, Rfl: 0   atorvastatin (LIPITOR) 40 MG tablet, Take 1 tablet (40 mg total) by mouth daily., Disp: 90 tablet, Rfl: 1   metFORMIN (GLUCOPHAGE) 500 MG tablet, Take 1 tablet (500 mg total)  by mouth daily with breakfast., Disp: 90 tablet, Rfl: 1   pantoprazole (PROTONIX) 20 MG tablet, Take 1 tablet (20 mg total) by mouth daily., Disp: 90 tablet, Rfl: 1  Allergies  Allergen Reactions   Lisinopril Anaphylaxis    MOUTH & JAW SWELLING    I personally reviewed active problem list, medication list, allergies, health maintenance, notes from last encounter, lab results with the patient/caregiver today.   Review of Systems  Constitutional:  Positive for malaise/fatigue.  Eyes:  Negative for blurred vision and double vision.  Respiratory:  Negative for shortness of breath and wheezing.   Cardiovascular:  Negative for chest pain, palpitations and leg swelling.  Gastrointestinal:  Negative for nausea and vomiting.  Neurological:  Negative for dizziness, tingling and headaches.      Objective  Vitals:   02/15/23 1330  BP: 138/82  Pulse: 87  Resp: 16  Temp: (!) 97.1 F (36.2 C)  TempSrc: Oral  SpO2: 96%  Weight:  227 lb 14.4 oz (103.4 kg)  Height: 5' 6.5" (1.689 m)    Body mass index is 36.23 kg/m.  Physical Exam Vitals reviewed.  Constitutional:      General: She is awake.     Appearance: Normal appearance. She is well-developed and well-groomed.  HENT:     Head: Normocephalic and atraumatic.  Cardiovascular:     Rate and Rhythm: Normal rate and regular rhythm.     Pulses: Normal pulses.          Radial pulses are 2+ on the right side and 2+ on the left side.     Heart sounds: Normal heart sounds. No murmur heard.    No friction rub. No gallop.  Pulmonary:     Effort: Pulmonary effort is normal.     Breath sounds: Normal breath sounds. No decreased air movement. No decreased breath sounds, wheezing, rhonchi or rales.  Musculoskeletal:     Cervical back: Normal range of motion.     Right lower leg: No edema.     Left lower leg: No edema.  Neurological:     General: No focal deficit present.     Mental Status: She is alert.     GCS: GCS eye subscore is 4.  GCS verbal subscore is 5. GCS motor subscore is 6.  Psychiatric:        Attention and Perception: Attention normal.        Mood and Affect: Mood and affect normal.        Speech: Speech normal.        Behavior: Behavior normal. Behavior is cooperative.      No results found for this or any previous visit (from the past 2160 hour(s)).   PHQ2/9:    02/15/2023    1:30 PM 10/15/2022    1:47 PM 07/23/2022    2:36 PM 07/16/2022    2:07 PM 07/14/2022   10:17 AM  Depression screen PHQ 2/9  Decreased Interest 0 0 0 0 0  Down, Depressed, Hopeless 0 0 0 0 0  PHQ - 2 Score 0 0 0 0 0  Altered sleeping 0 0 0 0   Tired, decreased energy 0 0 0 0   Change in appetite 0 0 0 0   Feeling bad or failure about yourself  0 0 0 0   Trouble concentrating 0 0 0 0   Moving slowly or fidgety/restless 0 0 0 0   Suicidal thoughts 0 0 0 0   PHQ-9 Score 0 0 0 0   Difficult doing work/chores Not difficult at all Not difficult at all Not difficult at all Not difficult at all        01/06/2022    3:06 PM 10/08/2021    2:53 PM 07/20/2021    1:36 PM 04/20/2021   11:27 AM  GAD 7 : Generalized Anxiety Score  Nervous, Anxious, on Edge 0 0 0 0  Control/stop worrying 0 0 0 0  Worry too much - different things 0 0 0 0  Trouble relaxing 0 0 0 0  Restless 0 0 0 0  Easily annoyed or irritable 0 0 0 0  Afraid - awful might happen 0 0 0 0  Total GAD 7 Score 0 0 0 0  Anxiety Difficulty Not difficult at all Not difficult at all Not difficult at all Not difficult at all       Fall Risk:    02/15/2023    1:29 PM 10/15/2022  1:47 PM 07/23/2022    2:34 PM 07/16/2022    2:07 PM 07/06/2022   11:05 AM  Fall Risk   Falls in the past year? 0 0 0 0 0  Number falls in past yr: 0 0 0 0 0  Injury with Fall? 0 0 0 0 0  Risk for fall due to : No Fall Risks No Fall Risks No Fall Risks No Fall Risks No Fall Risks  Follow up Falls prevention discussed;Education provided;Falls evaluation completed Falls prevention  discussed;Education provided;Falls evaluation completed Education provided;Falls prevention discussed Falls prevention discussed;Education provided;Falls evaluation completed Falls prevention discussed;Education provided;Falls evaluation completed      Functional Status Survey: Is the patient deaf or have difficulty hearing?: No Does the patient have difficulty seeing, even when wearing glasses/contacts?: No Does the patient have difficulty concentrating, remembering, or making decisions?: Yes Does the patient have difficulty walking or climbing stairs?: Yes Does the patient have difficulty dressing or bathing?: No Does the patient have difficulty doing errands alone such as visiting a doctor's office or shopping?: No    Assessment & Plan  Problem List Items Addressed This Visit       Cardiovascular and Mediastinum   Mixed Alzheimer's and vascular dementia (HCC)    Chronic, ongoing Currently taking Donepezil 10 mg PO every day Most recently seen by Neurology in June 2024 - recommend she live with someone else to assist with socialization, stay active and eat balanced diet with plenty of vegetables Would prefer to send scripts to a pharmacy that provides pill packing as she seems to have some confusion with medications and I am concerned for compliance. Offered to send scripts to [facility] but she declined today Reviewed meds needing refills with her and those that have new scripts  Recommend she continues to see Neurology for monitoring - would like MMSE at next apt with PCP  Follow up in 3 months or sooner if concerns arise        Relevant Medications   traZODone (DESYREL) 50 MG tablet   atorvastatin (LIPITOR) 40 MG tablet   Hypertension, benign    Chronic, historic condition  Appears well managed on current regimen comprised of Losartan 50 mg PO every day Continue current regimen Continue checking BP at home - recommend she keeps a log of results to review at follow  up  Follow up in 3 months or sooner if concerns arise        Relevant Medications   atorvastatin (LIPITOR) 40 MG tablet   Other Relevant Orders   CBC w/Diff/Platelet   COMPLETE METABOLIC PANEL WITH GFR     Digestive   GERD (gastroesophageal reflux disease)    Chronic, historic condition Appears well managed on current regimen comprised of pantoprazole 20 mg pO at bedtime  Continue current regimen Follow up in 6 months or sooner if concerns arise        Relevant Medications   pantoprazole (PROTONIX) 20 MG tablet     Endocrine   Type 2 diabetes mellitus without complication, without long-term current use of insulin (HCC) - Primary    Chronic, historic condition Appears well managed on current regimen comprised of Metformin 500 mg po every day  She reports checking her glucose levels a few times per week and denies readings <70 or >200  Continue current regimen. Most recent A1c was 6.6%- recheck today Results to dictate further management  Follow up in 3 months or sooner if concerns arise  Relevant Medications   atorvastatin (LIPITOR) 40 MG tablet   metFORMIN (GLUCOPHAGE) 500 MG tablet   Other Relevant Orders   Microalbumin / creatinine urine ratio   HgB A1c   Hypothyroidism    Chronic, historic condition Most recent TSH was low- levothyroxine strength was reduced to 75 mcg about 3 months ago Recheck TSH and T4 today- Results to dictate further management  Follow up in 3 months or sooner if concerns arise        Relevant Orders   TSH   T4     Genitourinary   Stage 3a chronic kidney disease (HCC)    Recheck CMP today Results to dictate further management        Relevant Orders   COMPLETE METABOLIC PANEL WITH GFR     Other   Hyperlipidemia    Chronic, historic condition Appears well managed on current regimen comprised of Atorvastatin 40 mg PO every day Continue current regimen Recheck lipid panel today. Follow up in 6 months or sooner if concerns  arise        Relevant Medications   atorvastatin (LIPITOR) 40 MG tablet   Other Relevant Orders   Lipid Profile   Insomnia due to other mental disorder    Chronic, historic  She reports she is having to use quetiapine but is only able to use about 1/4 of tablet due to severe sedation Will try switching her to Trazodone 25 mg instead to assist with sleep initiation issues  Would prefer to get her to a melatonin if tolerable given age Follow up in 6 weeks to discuss med change or sooner if concerns arise        Relevant Medications   traZODone (DESYREL) 50 MG tablet     Return in about 6 weeks (around 03/29/2023) for insomnia .   I, Uzziel Russey E Georgio Hattabaugh, PA-C, have reviewed all documentation for this visit. The documentation on 02/15/23 for the exam, diagnosis, procedures, and orders are all accurate and complete.   Jacquelin Hawking, MHS, PA-C [facility]

## 2023-02-15 NOTE — Assessment & Plan Note (Signed)
Chronic, historic condition  Appears well managed on current regimen comprised of Losartan 50 mg PO every day Continue current regimen Continue checking BP at home - recommend she keeps a log of results to review at follow up  Follow up in 3 months or sooner if concerns arise

## 2023-02-15 NOTE — Assessment & Plan Note (Signed)
Chronic, historic condition Most recent TSH was low- levothyroxine strength was reduced to 75 mcg about 3 months ago Recheck TSH and T4 today- Results to dictate further management  Follow up in 3 months or sooner if concerns arise

## 2023-02-15 NOTE — Assessment & Plan Note (Signed)
Recheck CMP today Results to dictate further management

## 2023-02-15 NOTE — Assessment & Plan Note (Signed)
Chronic, historic condition Appears well managed on current regimen comprised of pantoprazole 20 mg pO at bedtime  Continue current regimen Follow up in 6 months or sooner if concerns arise

## 2023-02-15 NOTE — Assessment & Plan Note (Signed)
Chronic, historic  She reports she is having to use quetiapine but is only able to use about 1/4 of tablet due to severe sedation Will try switching her to Trazodone 25 mg instead to assist with sleep initiation issues  Would prefer to get her to a melatonin if tolerable given age Follow up in 6 weeks to discuss med change or sooner if concerns arise

## 2023-02-15 NOTE — Assessment & Plan Note (Signed)
Chronic, historic condition Appears well managed on current regimen comprised of Atorvastatin 40 mg PO every day Continue current regimen Recheck lipid panel today. Follow up in 6 months or sooner if concerns arise

## 2023-02-16 LAB — CBC WITH DIFFERENTIAL/PLATELET
Absolute Lymphocytes: 3268 {cells}/uL (ref 850–3900)
Absolute Monocytes: 656 {cells}/uL (ref 200–950)
Basophils Absolute: 48 {cells}/uL (ref 0–200)
Basophils Relative: 0.5 %
Eosinophils Absolute: 124 {cells}/uL (ref 15–500)
Eosinophils Relative: 1.3 %
HCT: 38.4 % (ref 35.0–45.0)
Hemoglobin: 12.5 g/dL (ref 11.7–15.5)
MCH: 28.6 pg (ref 27.0–33.0)
MCHC: 32.6 g/dL (ref 32.0–36.0)
MCV: 87.9 fL (ref 80.0–100.0)
MPV: 10.9 fL (ref 7.5–12.5)
Monocytes Relative: 6.9 %
Neutro Abs: 5406 {cells}/uL (ref 1500–7800)
Neutrophils Relative %: 56.9 %
Platelets: 274 10*3/uL (ref 140–400)
RBC: 4.37 10*6/uL (ref 3.80–5.10)
RDW: 13.1 % (ref 11.0–15.0)
Total Lymphocyte: 34.4 %
WBC: 9.5 10*3/uL (ref 3.8–10.8)

## 2023-02-16 LAB — T4: T4, Total: 6.2 ug/dL (ref 5.1–11.9)

## 2023-02-16 LAB — LIPID PANEL
Cholesterol: 117 mg/dL (ref <200)
HDL: 60 mg/dL (ref >=50)
LDL Cholesterol (Calc): 41 mg/dL
Non-HDL Cholesterol (Calc): 57 mg/dL (ref <130)
Total CHOL/HDL Ratio: 2 (calc) (ref <5.0)
Triglycerides: 80 mg/dL (ref <150)

## 2023-02-16 LAB — HEMOGLOBIN A1C
Hgb A1c MFr Bld: 6.7 %{Hb} — ABNORMAL HIGH (ref <5.7)
Mean Plasma Glucose: 146 mg/dL
eAG (mmol/L): 8.1 mmol/L

## 2023-02-16 LAB — COMPLETE METABOLIC PANEL WITH GFR
AG Ratio: 1.1 (calc) (ref 1.0–2.5)
ALT: 12 U/L (ref 6–29)
AST: 19 U/L (ref 10–35)
Albumin: 4.1 g/dL (ref 3.6–5.1)
Alkaline phosphatase (APISO): 134 U/L (ref 37–153)
BUN: 10 mg/dL (ref 7–25)
CO2: 26 mmol/L (ref 20–32)
Calcium: 9.2 mg/dL (ref 8.6–10.4)
Chloride: 106 mmol/L (ref 98–110)
Creat: 0.91 mg/dL (ref 0.60–1.00)
Globulin: 3.6 g/dL (ref 1.9–3.7)
Glucose, Bld: 119 mg/dL — ABNORMAL HIGH (ref 65–99)
Potassium: 4.4 mmol/L (ref 3.5–5.3)
Sodium: 140 mmol/L (ref 135–146)
Total Bilirubin: 0.3 mg/dL (ref 0.2–1.2)
Total Protein: 7.7 g/dL (ref 6.1–8.1)
eGFR: 65 mL/min/{1.73_m2} (ref >=60)

## 2023-02-16 LAB — TSH: TSH: 0.88 m[IU]/L (ref 0.40–4.50)

## 2023-02-18 NOTE — Progress Notes (Signed)
Your labs are back Your A1c is stable at 6.7% which is in goal Your electrolytes, liver and kidney function were overall normal at this time Your thyroid testing was normal  Your CBC was normal- no signs of anemia Your cholesterol looks great Please continue with your current regimen as directed and let us know if you have any questions or concerns.

## 2023-02-25 DIAGNOSIS — N1831 Chronic kidney disease, stage 3a: Secondary | ICD-10-CM | POA: Diagnosis not present

## 2023-02-25 DIAGNOSIS — E119 Type 2 diabetes mellitus without complications: Secondary | ICD-10-CM | POA: Diagnosis not present

## 2023-02-25 DIAGNOSIS — E785 Hyperlipidemia, unspecified: Secondary | ICD-10-CM | POA: Diagnosis not present

## 2023-02-25 DIAGNOSIS — E039 Hypothyroidism, unspecified: Secondary | ICD-10-CM | POA: Diagnosis not present

## 2023-02-25 DIAGNOSIS — I10 Essential (primary) hypertension: Secondary | ICD-10-CM | POA: Diagnosis not present

## 2023-02-26 LAB — MICROALBUMIN / CREATININE URINE RATIO
Creatinine, Urine: 30 mg/dL (ref 20–275)
Microalb Creat Ratio: 7 mg/g{creat} (ref <30)
Microalb, Ur: 0.2 mg/dL

## 2023-02-28 NOTE — Progress Notes (Signed)
Microalbumin is in normal range

## 2023-03-02 ENCOUNTER — Other Ambulatory Visit: Payer: Self-pay | Admitting: Family Medicine

## 2023-03-02 DIAGNOSIS — E039 Hypothyroidism, unspecified: Secondary | ICD-10-CM

## 2023-03-02 NOTE — Telephone Encounter (Signed)
Requested Prescriptions  Pending Prescriptions Disp Refills   levothyroxine (SYNTHROID) 75 MCG tablet [Pharmacy Med Name: LEVOTHYROXINE 0.075MG  ( ) TABS] 90 tablet 3    Sig: TAKE 1 TABLET(75 MCG) BY MOUTH DAILY BEFORE BREAKFAST     Endocrinology:  Hypothyroid Agents Passed - 03/02/2023  1:41 PM      Passed - TSH in normal range and within 360 days    TSH  Date Value Ref Range Status  02/15/2023 0.88 0.40 - 4.50 mIU/L Final         Passed - Valid encounter within last 12 months    Recent Outpatient Visits           2 weeks ago Type 2 diabetes mellitus without complication, without long-term current use of insulin (HCC)   [place] [facility] Mecum, Oswaldo Conroy, PA-C   4 months ago Hypertension, benign   [facility] Danelle Berry, PA-C   7 months ago Hypothyroidism, unspecified type   [facility] Danelle Berry, PA-C   7 months ago Care plan discussed with patient   [facility] Mecum, Erin E, PA-C   9 months ago Type 2 diabetes mellitus without complication, without long-term current use of insulin [facility])   [place] [facility] Danelle Berry, PA-C       Future Appointments             In 1 month Mecum, Oswaldo Conroy, PA-C [place] [facility]

## 2023-03-16 DIAGNOSIS — G309 Alzheimer's disease, unspecified: Secondary | ICD-10-CM | POA: Diagnosis not present

## 2023-03-16 DIAGNOSIS — R2689 Other abnormalities of gait and mobility: Secondary | ICD-10-CM | POA: Diagnosis not present

## 2023-03-16 DIAGNOSIS — R42 Dizziness and giddiness: Secondary | ICD-10-CM | POA: Diagnosis not present

## 2023-04-05 ENCOUNTER — Encounter: Payer: Self-pay | Admitting: Physician Assistant

## 2023-04-05 ENCOUNTER — Ambulatory Visit (INDEPENDENT_AMBULATORY_CARE_PROVIDER_SITE_OTHER): Payer: Medicare Other | Admitting: Physician Assistant

## 2023-04-05 DIAGNOSIS — F99 Mental disorder, not otherwise specified: Secondary | ICD-10-CM

## 2023-04-05 DIAGNOSIS — F5105 Insomnia due to other mental disorder: Secondary | ICD-10-CM | POA: Diagnosis not present

## 2023-04-05 MED ORDER — TRAZODONE HCL 50 MG PO TABS: 25.0000 mg | ORAL_TABLET | Freq: Every evening | ORAL | 0 refills | Status: DC | PRN

## 2023-04-05 NOTE — Patient Instructions (Signed)
You have a new script for your Levothyroxine with a year's supply- please ask your pharmacy to fill it  Please continue to take a half tablet of Trazodone at night to help with sleep.

## 2023-04-05 NOTE — Assessment & Plan Note (Signed)
 Chronic, appears to be improving Patient reports that she is tried her trazodone  25 mg p.o. nightly sparingly. Her responses to questions are bit scattered so I am unsure if she is actually satisfied with medication or if she wants to return to using quetiapine . I asked her if she would prefer to switch back but she states that she would like to give trazodone  a good try first. Recommend she continues at half dose and practices good sleep hygiene measures to assist with insomnia Recommend follow-up in about 3 months or sooner if concerns arise

## 2023-04-05 NOTE — Progress Notes (Signed)
 Established Patient Office Visit  Name: Katherine Stevens   MRN: [id number]    DOB: Feb 14, 1946   Date:04/05/2023  Today's Provider: Rocky Mt, MHS, PA-C Introduced myself to the patient as a PA-C and provided education on APPs in clinical practice.         Subjective  Chief Complaint  Chief Complaint  Patient presents with   Follow-up    Insomnia- about the same    HPI   Insomnia follow up  She was started on Trazodone  25 mg PO at bedtime at previous visit and instructed to stop Quetiapine   She states her sleep is improving- states she is getting about 8-9 hours per night but it can be varied  She states she is taking half a tablet of Trazodone - states she doesn't really like it very much States she likes the Quetiapine  better- reminded her of her previous hesitation with taking it. She states she has not given Trazodone  a good try yet and she wants to continue with the Trazodone  for now She denies grogginess or confusion. Denies excess sleepiness the next day    Patient Active Problem List   Diagnosis Date Noted   Stage 3a chronic kidney disease (HCC) 05/17/2022   B12 deficiency 05/17/2022   Vitamin D  deficiency 11/20/2020   Insomnia due to other mental disorder 05/01/2020   OAB (overactive bladder) 05/01/2020   Osteopenia 05/01/2020   History of hepatitis C 05/01/2020   Abnormality of gait and mobility 05/01/2020   Current mild episode of major depressive disorder (HCC) 04/16/2019   Type 2 diabetes mellitus without complication, without long-term current use of insulin (HCC) 04/16/2019   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/16/2019   Folate deficiency 04/16/2019   Vitamin B1 deficiency 04/16/2019   GERD (gastroesophageal reflux disease) 11/22/2018   Tobacco dependence 12/12/2015   Hyperlipidemia 12/10/2014   Osteoarthritis of both knees 08/01/2012   Seasonal allergies 07/28/2012   Class 2 severe obesity with serious comorbidity and body mass  index (BMI) of 37.0 to 37.9 in adult [facility]) 07/29/2011   Hypothyroidism 12/20/2008   Hypertension, benign 12/20/2008    Past Surgical History:  Procedure Laterality Date   ABDOMINAL HYSTERECTOMY      Family History  Problem Relation Age of Onset   Heart failure Mother    Diabetes Mother    Diabetes Sister    Hyperlipidemia Sister    Thyroid  disease Sister    Stroke Brother    Heart attack Brother    Cancer Maternal Grandmother    Heart attack Brother     Social History   Tobacco Use   Smoking status: Every Day    Current packs/day: 0.25    Types: Cigarettes   Smokeless tobacco: Never   Tobacco comments:    Reports smoking approximately 5-6 cigarettes a day  Substance Use Topics   Alcohol use: Not Currently     Current Outpatient Medications:    atorvastatin  (LIPITOR) 40 MG tablet, Take 1 tablet (40 mg total) by mouth daily., Disp: 90 tablet, Rfl: 1   buPROPion  (WELLBUTRIN ) 75 MG tablet, TAKE 1 TABLET BY MOUTH WITH BREAKFAST AND WITH LUNCH FOR MOOD OR SMOKING CESSATION., Disp: 180 tablet, Rfl: 1   Cholecalciferol (VITAMIN D3) 50 MCG (2000 UT) capsule, Take 1 capsule (2,000 Units total) by mouth daily., Disp: 90 capsule, Rfl: 3   citalopram  (CELEXA ) 20 MG tablet, Take 1 tablet (20 mg total) by mouth daily., Disp: 90 tablet, Rfl:  1   cyanocobalamin  (VITAMIN B12) 500 MCG tablet, Take 1 tablet (500 mcg total) by mouth daily., Disp: 90 tablet, Rfl: 1   donepezil  (ARICEPT ) 10 MG tablet, TAKE 1 TABLET(10 MG) BY MOUTH AT BEDTIME, Disp: 90 tablet, Rfl: 0   levocetirizine (XYZAL ) 5 MG tablet, Take 1 tablet (5 mg total) by mouth every evening., Disp: 90 tablet, Rfl: 1   levothyroxine  (SYNTHROID ) 75 MCG tablet, TAKE 1 TABLET(75 MCG) BY MOUTH DAILY BEFORE BREAKFAST, Disp: 90 tablet, Rfl: 3   losartan  (COZAAR ) 50 MG tablet, TAKE 1 TABLET(50 MG) BY MOUTH DAILY FOR BLOOD PRESSURE, Disp: 90 tablet, Rfl: 1   metFORMIN  (GLUCOPHAGE ) 500 MG tablet, Take 1 tablet (500 mg total) by mouth daily  with breakfast., Disp: 90 tablet, Rfl: 1   oxybutynin  (DITROPAN -XL) 10 MG 24 hr tablet, TAKE 1 TABLET(5 MG) BY MOUTH DAILY FOR OVERACTIVE BLADDER, Disp: 90 tablet, Rfl: 1   pantoprazole  (PROTONIX ) 20 MG tablet, Take 1 tablet (20 mg total) by mouth daily., Disp: 90 tablet, Rfl: 1   Travoprost, BAK Free, (TRAVATAN) 0.004 % SOLN ophthalmic solution, 1 drop at bedtime., Disp: , Rfl:    traZODone  (DESYREL ) 50 MG tablet, Take 0.5-1 tablets (25-50 mg total) by mouth at bedtime as needed for sleep., Disp: 30 tablet, Rfl: 0  Allergies  Allergen Reactions   Lisinopril Anaphylaxis    MOUTH & JAW SWELLING    I personally reviewed active problem list, medication list, allergies, notes from last encounter with the patient/caregiver today.   ROS  See HPI for relevant ROS   Objective  Vitals:   04/05/23 1127  BP: 122/74  Pulse: 85  Resp: 16  SpO2: 97%  Weight: 220 lb (99.8 kg)  Height: 5' 6 (1.676 m)    Body mass index is 35.51 kg/m.  Physical Exam Vitals reviewed.  Constitutional:      General: She is awake.     Appearance: Normal appearance. She is well-developed and well-groomed.  HENT:     Head: Normocephalic and atraumatic.  Eyes:     General: Lids are normal. Gaze aligned appropriately.     Extraocular Movements: Extraocular movements intact.     Conjunctiva/sclera: Conjunctivae normal.  Pulmonary:     Effort: Pulmonary effort is normal.  Neurological:     General: No focal deficit present.     Mental Status: She is alert and oriented to person, place, and time.     GCS: GCS eye subscore is 4. GCS verbal subscore is 5. GCS motor subscore is 6.     Cranial Nerves: No cranial nerve deficit, dysarthria or facial asymmetry.  Psychiatric:        Attention and Perception: Attention and perception normal.        Mood and Affect: Mood and affect normal.        Speech: Speech normal.        Behavior: Behavior normal. Behavior is cooperative.      Recent Results (from the  past 2160 hours)  TSH     Status: None   Collection Time: 02/15/23  2:06 PM  Result Value Ref Range   TSH 0.88 0.40 - 4.50 mIU/L  T4     Status: None   Collection Time: 02/15/23  2:06 PM  Result Value Ref Range   T4, Total 6.2 5.1 - 11.9 mcg/dL  CBC w/Diff/Platelet     Status: None   Collection Time: 02/15/23  2:06 PM  Result Value Ref Range   WBC 9.5 3.8 -  10.8 Thousand/uL   RBC 4.37 3.80 - 5.10 Million/uL   Hemoglobin 12.5 11.7 - 15.5 g/dL   HCT 61.5 64.9 - 54.9 %   MCV 87.9 80.0 - 100.0 fL   MCH 28.6 27.0 - 33.0 pg   MCHC 32.6 32.0 - 36.0 g/dL    Comment: For adults, a slight decrease in the calculated MCHC value (in the range of 30 to 32 g/dL) is most likely not clinically significant; however, it should be interpreted with caution in correlation with other red cell parameters and the patient's clinical condition.    RDW 13.1 11.0 - 15.0 %   Platelets 274 140 - 400 Thousand/uL   MPV 10.9 7.5 - 12.5 fL   Neutro Abs 5,406 1,500 - 7,800 cells/uL   Absolute Lymphocytes 3,268 850 - 3,900 cells/uL   Absolute Monocytes 656 200 - 950 cells/uL   Eosinophils Absolute 124 15 - 500 cells/uL   Basophils Absolute 48 0 - 200 cells/uL   Neutrophils Relative % 56.9 %   Total Lymphocyte 34.4 %   Monocytes Relative 6.9 %   Eosinophils Relative 1.3 %   Basophils Relative 0.5 %  HgB A1c     Status: Abnormal   Collection Time: 02/15/23  2:06 PM  Result Value Ref Range   Hgb A1c MFr Bld 6.7 (H) <5.7 % of total Hgb    Comment: For someone without known diabetes, a hemoglobin A1c value of 6.5% or greater indicates that they may have  diabetes and this should be confirmed with a follow-up  test. . For someone with known diabetes, a value <7% indicates  that their diabetes is well controlled and a value  greater than or equal to 7% indicates suboptimal  control. A1c targets should be individualized based on  duration of diabetes, age, comorbid conditions, and  other  considerations. . Currently, no consensus exists regarding use of hemoglobin A1c for diagnosis of diabetes for children. .    Mean Plasma Glucose 146 mg/dL   eAG (mmol/L) 8.1 mmol/L  COMPLETE METABOLIC PANEL WITH GFR     Status: Abnormal   Collection Time: 02/15/23  2:06 PM  Result Value Ref Range   Glucose, Bld 119 (H) 65 - 99 mg/dL    Comment: .            Fasting reference interval . For someone without known diabetes, a glucose value between 100 and 125 mg/dL is consistent with prediabetes and should be confirmed with a follow-up test. .    BUN 10 7 - 25 mg/dL   Creat 9.08 9.39 - 8.99 mg/dL   eGFR 65 > OR = 60 fO/fpw/8.26f7   BUN/Creatinine Ratio SEE NOTE: 6 - 22 (calc)    Comment:    Not Reported: BUN and Creatinine are within    reference range. .    Sodium 140 135 - 146 mmol/L   Potassium 4.4 3.5 - 5.3 mmol/L   Chloride 106 98 - 110 mmol/L   CO2 26 20 - 32 mmol/L   Calcium  9.2 8.6 - 10.4 mg/dL   Total Protein 7.7 6.1 - 8.1 g/dL   Albumin 4.1 3.6 - 5.1 g/dL   Globulin 3.6 1.9 - 3.7 g/dL (calc)   AG Ratio 1.1 1.0 - 2.5 (calc)   Total Bilirubin 0.3 0.2 - 1.2 mg/dL   Alkaline phosphatase (APISO) 134 37 - 153 U/L   AST 19 10 - 35 U/L   ALT 12 6 - 29 U/L  Lipid Profile  Status: None   Collection Time: 02/15/23  2:06 PM  Result Value Ref Range   Cholesterol 117 <200 mg/dL   HDL 60 > OR = 50 mg/dL   Triglycerides 80 <849 mg/dL   LDL Cholesterol (Calc) 41 mg/dL (calc)    Comment: Reference range: <100 . Desirable range <100 mg/dL for primary prevention;   <70 mg/dL for patients with CHD or diabetic patients  with > or = 2 CHD risk factors. SABRA LDL-C is now calculated using the Martin-Hopkins  calculation, which is a validated novel method providing  better accuracy than the Friedewald equation in the  estimation of LDL-C.  Gladis APPLETHWAITE et al. SANDREA. 7986;689(80): 2061-2068  (http://education.[employer].com/faq/FAQ164)    Total CHOL/HDL Ratio 2.0 <5.0  (calc)   Non-HDL Cholesterol (Calc) 57 <869 mg/dL (calc)    Comment: For patients with diabetes plus 1 major ASCVD risk  factor, treating to a non-HDL-C goal of <100 mg/dL  (LDL-C of <29 mg/dL) is considered a therapeutic  option.   Microalbumin / creatinine urine ratio     Status: None   Collection Time: 02/25/23  2:08 PM  Result Value Ref Range   Creatinine, Urine 30 20 - 275 mg/dL   Microalb, Ur 0.2 mg/dL    Comment: Reference Range Not established    Microalb Creat Ratio 7 <30 mg/g creat    Comment: . The ADA defines abnormalities in albumin excretion as follows: SABRA Albuminuria Category        Result (mg/g creatinine) . Normal to Mildly increased   <30 Moderately increased         30-299  Severely increased           > OR = 300 . The ADA recommends that at least two of three specimens collected within a 3-6 month period be abnormal before considering a patient to be within a diagnostic category.      PHQ2/9:    02/15/2023    1:30 PM 10/15/2022    1:47 PM 07/23/2022    2:36 PM 07/16/2022    2:07 PM 07/14/2022   10:17 AM  Depression screen PHQ 2/9  Decreased Interest 0 0 0 0 0  Down, Depressed, Hopeless 0 0 0 0 0  PHQ - 2 Score 0 0 0 0 0  Altered sleeping 0 0 0 0   Tired, decreased energy 0 0 0 0   Change in appetite 0 0 0 0   Feeling bad or failure about yourself  0 0 0 0   Trouble concentrating 0 0 0 0   Moving slowly or fidgety/restless 0 0 0 0   Suicidal thoughts 0 0 0 0   PHQ-9 Score 0 0 0 0   Difficult doing work/chores Not difficult at all Not difficult at all Not difficult at all Not difficult at all       Fall Risk:    02/15/2023    1:29 PM 10/15/2022    1:47 PM 07/23/2022    2:34 PM 07/16/2022    2:07 PM 07/06/2022   11:05 AM  Fall Risk   Falls in the past year? 0 0 0 0 0  Number falls in past yr: 0 0 0 0 0  Injury with Fall? 0 0 0 0 0  Risk for fall due to : No Fall Risks No Fall Risks No Fall Risks No Fall Risks No Fall Risks  Follow up Falls  prevention discussed;Education provided;Falls evaluation completed Falls prevention discussed;Education provided;Falls evaluation completed Education  provided;Falls prevention discussed Falls prevention discussed;Education provided;Falls evaluation completed Falls prevention discussed;Education provided;Falls evaluation completed      Functional Status Survey:      Assessment & Plan  Problem List Items Addressed This Visit       Other   Insomnia due to other mental disorder   Chronic, appears to be improving Patient reports that she is tried her trazodone  25 mg p.o. nightly sparingly. Her responses to questions are bit scattered so I am unsure if she is actually satisfied with medication or if she wants to return to using quetiapine . I asked her if she would prefer to switch back but she states that she would like to give trazodone  a good try first. Recommend she continues at half dose and practices good sleep hygiene measures to assist with insomnia Recommend follow-up in about 3 months or sooner if concerns arise      Relevant Medications   traZODone  (DESYREL ) 50 MG tablet     No follow-ups on file.   I, Niya Behler E Naythen Heikkila, PA-C, have reviewed all documentation for this visit. The documentation on 04/05/23 for the exam, diagnosis, procedures, and orders are all accurate and complete.   Rocky Mt, MHS, PA-C [facility]

## 2023-05-09 ENCOUNTER — Other Ambulatory Visit: Payer: Self-pay | Admitting: Family Medicine

## 2023-05-09 DIAGNOSIS — E119 Type 2 diabetes mellitus without complications: Secondary | ICD-10-CM

## 2023-05-10 NOTE — Telephone Encounter (Signed)
 Requested Prescriptions  Pending Prescriptions Disp Refills   metFORMIN  (GLUCOPHAGE ) 500 MG tablet [Pharmacy Med Name: METFORMIN  500MG  TABLETS] 90 tablet 0    Sig: TAKE 1 TABLET(500 MG) BY MOUTH DAILY WITH BREAKFAST     Endocrinology:  Diabetes - Biguanides Passed - 05/10/2023  4:15 PM      Passed - Cr in normal range and within 360 days    Creat  Date Value Ref Range Status  02/15/2023 0.91 0.60 - 1.00 mg/dL Final   Creatinine, Urine  Date Value Ref Range Status  02/25/2023 30 20 - 275 mg/dL Final         Passed - HBA1C is between 0 and 7.9 and within 180 days    Hgb A1c MFr Bld  Date Value Ref Range Status  02/15/2023 6.7 (H) <5.7 % of total Hgb Final    Comment:    For someone without known diabetes, a hemoglobin A1c value of 6.5% or greater indicates that they may have  diabetes and this should be confirmed with a follow-up  test. . For someone with known diabetes, a value <7% indicates  that their diabetes is well controlled and a value  greater than or equal to 7% indicates suboptimal  control. A1c targets should be individualized based on  duration of diabetes, age, comorbid conditions, and  other considerations. . Currently, no consensus exists regarding use of hemoglobin A1c for diagnosis of diabetes for children. .          Passed - eGFR in normal range and within 360 days    GFR, Est African American  Date Value Ref Range Status  10/09/2020 68 > OR = 60 mL/min/1.67m2 Final   GFR, Est Non African American  Date Value Ref Range Status  10/09/2020 59 (L) > OR = 60 mL/min/1.78m2 Final   eGFR  Date Value Ref Range Status  02/15/2023 65 > OR = 60 mL/min/1.85m2 Final         Passed - B12 Level in normal range and within 720 days    Vitamin B-12  Date Value Ref Range Status  05/17/2022 383 200 - 1,100 pg/mL Final    Comment:    . Please Note: Although the reference range for vitamin B12 is [phone] pg/mL, it has been reported that between 5 and 10% of  patients with values between 200 and 400 pg/mL may experience neuropsychiatric and hematologic abnormalities due to occult B12 deficiency; less than 1% of patients with values above 400 pg/mL will have symptoms. SABRA Amy - Valid encounter within last 6 months    Recent Outpatient Visits           1 month ago Insomnia due to other mental disorder   [facility] Mecum, Erin E, PA-C   2 months ago Type 2 diabetes mellitus without complication, without long-term current use of insulin [facility])   [facility] Mecum, Rocky BRAVO, PA-C   6 months ago Hypertension, benign   [facility] Leavy Mole, PA-C   9 months ago Hypothyroidism, unspecified type   [facility] Leavy Mole, PA-C   10 months ago Care plan discussed with patient   [facility] Mecum, Rocky BRAVO, PA-C       Future Appointments             In 2 months Gareth, Mliss FALCON, FNP [facility]            Passed - CBC within normal limits and completed in the last 12 months    WBC  Date Value Ref Range Status  02/15/2023 9.5 3.8 - 10.8 Thousand/uL Final   RBC  Date Value Ref Range Status  02/15/2023 4.37 3.80 - 5.10 Million/uL Final   Hemoglobin  Date Value Ref Range Status  02/15/2023 12.5 11.7 - 15.5 g/dL Final   HCT  Date Value Ref Range Status  02/15/2023 38.4 35.0 - 45.0 % Final   MCHC  Date Value Ref Range Status  02/15/2023 32.6 32.0 - 36.0 g/dL Final    Comment:    For adults, a slight decrease in the calculated MCHC value (in the range of 30 to 32 g/dL) is most likely not clinically significant; however, it should be interpreted with caution in correlation with other red cell parameters and the patient's clinical condition.    [facility]  Date Value Ref Range Status  02/15/2023 28.6 27.0 - 33.0 pg Final   MCV  Date Value Ref Range Status   02/15/2023 87.9 80.0 - 100.0 fL Final   No results found for: PLTCOUNTKUC, LABPLAT, POCPLA RDW  Date Value Ref Range Status  02/15/2023 13.1 11.0 - 15.0 % Final

## 2023-05-23 ENCOUNTER — Other Ambulatory Visit: Payer: Self-pay | Admitting: Family Medicine

## 2023-05-23 DIAGNOSIS — F320 Major depressive disorder, single episode, mild: Secondary | ICD-10-CM

## 2023-05-23 DIAGNOSIS — F419 Anxiety disorder, unspecified: Secondary | ICD-10-CM

## 2023-05-23 DIAGNOSIS — J302 Other seasonal allergic rhinitis: Secondary | ICD-10-CM

## 2023-05-27 ENCOUNTER — Other Ambulatory Visit: Payer: Self-pay | Admitting: Physician Assistant

## 2023-05-27 ENCOUNTER — Other Ambulatory Visit: Payer: Self-pay | Admitting: Family Medicine

## 2023-05-27 DIAGNOSIS — E119 Type 2 diabetes mellitus without complications: Secondary | ICD-10-CM

## 2023-05-27 NOTE — Telephone Encounter (Signed)
Copied from CRM [id number]. Topic: Clinical - Medication Refill >> May 27, 2023  4:06 PM Higinio Roger wrote: Most Recent Primary Care Visit:  Provider: Providence Crosby  Department: [id number]  Visit Type: OFFICE VISIT  Date: 04/05/2023  Medication: metFORMIN (GLUCOPHAGE) 500 MG tablet  Has the patient contacted their pharmacy? Yes (Agent: If no, request that the patient contact the pharmacy for the refill. If patient does not wish to contact the pharmacy document the reason why and proceed with request.) (Agent: If yes, when and what did the pharmacy advise?) Pharmacy stated they did not get a response from provider.   Is this the correct pharmacy for this prescription? Yes If no, delete pharmacy and type the correct one.  This is the patient's preferred pharmacy:   [id number] - [place], [place] - [address] AT [facility] [address] [place] [address] Phone: [phone] Fax: [phone]   Has the prescription been filled recently? No  Is the patient out of the medication? Yes  Has the patient been seen for an appointment in the last year OR does the patient have an upcoming appointment? Yes  Can we respond through MyChart? No. Callback #: [phone]  Agent: Please be advised that Rx refills may take up to 3 business days. We ask that you follow-up with your pharmacy.

## 2023-05-30 NOTE — Telephone Encounter (Signed)
 Duplicate request- filled 05/30/23 #90 Requested Prescriptions  Pending Prescriptions Disp Refills   metFORMIN (GLUCOPHAGE) 500 MG tablet 90 tablet 0     There is no refill protocol information for this order

## 2023-05-30 NOTE — Telephone Encounter (Signed)
 Requested Prescriptions  Pending Prescriptions Disp Refills   metFORMIN (GLUCOPHAGE) 500 MG tablet [Pharmacy Med Name: METFORMIN 500MG  TABLETS] 90 tablet 0    Sig: TAKE 1 TABLET(500 MG) BY MOUTH DAILY WITH BREAKFAST     Endocrinology:  Diabetes - Biguanides Passed - 05/30/2023 11:31 AM      Passed - Cr in normal range and within 360 days    Creat  Date Value Ref Range Status  02/15/2023 0.91 0.60 - 1.00 mg/dL Final   Creatinine, Urine  Date Value Ref Range Status  02/25/2023 30 20 - 275 mg/dL Final         Passed - HBA1C is between 0 and 7.9 and within 180 days    Hgb A1c MFr Bld  Date Value Ref Range Status  02/15/2023 6.7 (H) <5.7 % of total Hgb Final    Comment:    For someone without known diabetes, a hemoglobin A1c value of 6.5% or greater indicates that they may have  diabetes and this should be confirmed with a follow-up  test. . For someone with known diabetes, a value <7% indicates  that their diabetes is well controlled and a value  greater than or equal to 7% indicates suboptimal  control. A1c targets should be individualized based on  duration of diabetes, age, comorbid conditions, and  other considerations. . Currently, no consensus exists regarding use of hemoglobin A1c for diagnosis of diabetes for children. .          Passed - eGFR in normal range and within 360 days    GFR, Est African American  Date Value Ref Range Status  10/09/2020 68 > OR = 60 mL/min/1.9m2 Final   GFR, Est Non African American  Date Value Ref Range Status  10/09/2020 59 (L) > OR = 60 mL/min/1.53m2 Final   eGFR  Date Value Ref Range Status  02/15/2023 65 > OR = 60 mL/min/1.65m2 Final         Passed - B12 Level in normal range and within 720 days    Vitamin B-12  Date Value Ref Range Status  05/17/2022 383 200 - 1,100 pg/mL Final    Comment:    . Please Note: Although the reference range for vitamin B12 is [phone] pg/mL, it has been reported that between 5 and 10% of  patients with values between 200 and 400 pg/mL may experience neuropsychiatric and hematologic abnormalities due to occult B12 deficiency; less than 1% of patients with values above 400 pg/mL will have symptoms. Verna Czech - Valid encounter within last 6 months    Recent Outpatient Visits           1 month ago Insomnia due to other mental disorder   [facility] Mecum, Erin E, PA-C   3 months ago Type 2 diabetes mellitus without complication, without long-term current use of insulin [facility])   [facility] Mecum, Oswaldo Conroy, PA-C   7 months ago Hypertension, benign   [facility] Danelle Berry, PA-C   10 months ago Hypothyroidism, unspecified type   [facility] Berry, PA-C   10 months ago Care plan discussed with patient   [facility] Mecum, Oswaldo Conroy, PA-C       Future Appointments             In 2 months Zane Herald, Rudolpho Sevin, FNP [facility]            Passed - CBC within normal limits and completed in the last 12 months    WBC  Date Value Ref Range Status  02/15/2023 9.5 3.8 - 10.8 Thousand/uL Final   RBC  Date Value Ref Range Status  02/15/2023 4.37 3.80 - 5.10 Million/uL Final   Hemoglobin  Date Value Ref Range Status  02/15/2023 12.5 11.7 - 15.5 g/dL Final   HCT  Date Value Ref Range Status  02/15/2023 38.4 35.0 - 45.0 % Final   MCHC  Date Value Ref Range Status  02/15/2023 32.6 32.0 - 36.0 g/dL Final    Comment:    For adults, a slight decrease in the calculated MCHC value (in the range of 30 to 32 g/dL) is most likely not clinically significant; however, it should be interpreted with caution in correlation with other red cell parameters and the patient's clinical condition.    [facility]  Date Value Ref Range Status  02/15/2023 28.6 27.0 - 33.0 pg Final   MCV  Date Value Ref Range Status   02/15/2023 87.9 80.0 - 100.0 fL Final   No results found for: "PLTCOUNTKUC", "LABPLAT", "POCPLA" RDW  Date Value Ref Range Status  02/15/2023 13.1 11.0 - 15.0 % Final

## 2023-05-31 ENCOUNTER — Telehealth: Payer: Self-pay | Admitting: Family Medicine

## 2023-05-31 NOTE — Telephone Encounter (Signed)
 Copied from CRM [id number]. Topic: General - Call Back - No Documentation >> May 31, 2023  3:28 PM Higinio Roger wrote: Reason for CRM: Ramiro Harvest (insurance agent) called from [employer]. He is wanting the Dr. To call and prove to the insurance company that she has a chronic condition for the policy. [employer]: [phone] [place]'s callback #: [phone] ext [phone]

## 2023-06-01 NOTE — Telephone Encounter (Signed)
 Called but was on hold for over 10 min. If they call back they will need to send a fax request with a ROI signed by patient.

## 2023-06-07 DIAGNOSIS — H401133 Primary open-angle glaucoma, bilateral, severe stage: Secondary | ICD-10-CM | POA: Diagnosis not present

## 2023-06-16 ENCOUNTER — Other Ambulatory Visit: Payer: Self-pay | Admitting: Family Medicine

## 2023-06-16 DIAGNOSIS — I10 Essential (primary) hypertension: Secondary | ICD-10-CM

## 2023-06-16 NOTE — Telephone Encounter (Signed)
 Copied from CRM [id number]. Topic: Clinical - Medication Refill >> Jun 16, 2023 12:25 PM Elle L wrote: Most Recent Primary Care Visit:  Provider: Jacquelin Hawking E  Department: [facility]  Visit Type: OFFICE VISIT  Date: 04/05/2023  Medication: losartan (COZAAR) 50 MG tablet  Has the patient contacted their pharmacy? Yes  Is this the correct pharmacy for this prescription? Yes If no, delete pharmacy and type the correct one.  This is the patient's preferred pharmacy:  [id number] - [place], [place] - [address] AT [facility] [address] [place] [address] [place] [address] Phone: [phone] Fax: [phone]   Has the prescription been filled recently? No  Is the patient out of the medication? Yes  Has the patient been seen for an appointment in the last year OR does the patient have an upcoming appointment? Yes  Can we respond through MyChart? No  Agent: Please be advised that Rx refills may take up to 3 business days. We ask that you follow-up with your pharmacy.

## 2023-06-17 MED ORDER — LOSARTAN POTASSIUM 50 MG PO TABS: 50.0000 mg | ORAL_TABLET | Freq: Every day | ORAL | 1 refills | Status: DC

## 2023-06-17 NOTE — Telephone Encounter (Signed)
 Requested Prescriptions  Pending Prescriptions Disp Refills   losartan (COZAAR) 50 MG tablet 90 tablet 1    Sig: Take 1 tablet (50 mg total) by mouth daily.     Cardiovascular:  Angiotensin Receptor Blockers Passed - 06/17/2023 10:05 AM      Passed - Cr in normal range and within 180 days    Creat  Date Value Ref Range Status  02/15/2023 0.91 0.60 - 1.00 mg/dL Final   Creatinine, Urine  Date Value Ref Range Status  02/25/2023 30 20 - 275 mg/dL Final         Passed - K in normal range and within 180 days    Potassium  Date Value Ref Range Status  02/15/2023 4.4 3.5 - 5.3 mmol/L Final         Passed - Patient is not pregnant      Passed - Last BP in normal range    BP Readings from Last 1 Encounters:  04/05/23 122/74         Passed - Valid encounter within last 6 months    Recent Outpatient Visits           2 months ago Insomnia due to other mental disorder   [facility] Mecum, Erin E, PA-C   4 months ago Type 2 diabetes mellitus without complication, without long-term current use of insulin (HCC)   [facility] Mecum, Oswaldo Conroy, PA-C   8 months ago Hypertension, benign   [facility] [place], Sheliah Mends, PA-C   11 months ago Hypothyroidism, unspecified type   [facility] Danelle Berry, PA-C   11 months ago Care plan discussed with patient   [facility] Mecum, Oswaldo Conroy, PA-C       Future Appointments             In 1 month Zane Herald, Rudolpho Sevin, FNP [facility], [facility]

## 2023-07-28 ENCOUNTER — Ambulatory Visit: Payer: Medicare Other

## 2023-07-28 VITALS — Ht 66.0 in | Wt 230.0 lb

## 2023-07-28 DIAGNOSIS — Z0000 Encounter for general adult medical examination without abnormal findings: Secondary | ICD-10-CM

## 2023-07-28 NOTE — Patient Instructions (Signed)
 Katherine Stevens , Thank you for taking time to come for your Medicare Wellness Visit. I appreciate your ongoing commitment to your health goals. Please review the following plan we discussed and let me know if I can assist you in the future.   Referrals/Orders/Follow-Ups/Clinician Recommendations: Yes, keep maintaining your health by keeping your appointments with Adeline Hone, PA-C and any specialists that you may see.  Call us  if you need anything.  Have a great year!!!!  This is a list of the screening recommended for you and due dates:  Health Maintenance  Topic Date Due   DTaP/Tdap/Td vaccine (1 - Tdap) Never done   COVID-19 Vaccine (6 - 2024-25 season) 12/05/2022   Mammogram  07/05/2023   Hemoglobin A1C  08/15/2023   Eye exam for diabetics  10/01/2023   Complete foot exam   10/15/2023   Flu Shot  11/04/2023   Yearly kidney function blood test for diabetes  02/15/2024   Yearly kidney health urinalysis for diabetes  02/25/2024   DEXA scan (bone density measurement)  07/04/2024   Medicare Annual Wellness Visit  07/27/2024   Pneumonia Vaccine  Completed   HPV Vaccine  Aged Out   Meningitis B Vaccine  Aged Out   Hepatitis C Screening  Discontinued   Zoster (Shingles) Vaccine  Discontinued    Advanced directives: (Copy Requested) Please bring a copy of your health care power of attorney and living will to the office to be added to your chart at your convenience. You can mail to [facility] [address] [place], [place] [address] or email to [email or web address]  Next Medicare Annual Wellness Visit scheduled for next year: Yes

## 2023-07-28 NOTE — Progress Notes (Addendum)
 Because this visit was a virtual/telehealth visit,  certain criteria was not obtained, such a blood pressure, CBG if applicable, and timed get up and go. Any medications not marked as "taking" were not mentioned during the medication reconciliation part of the visit. Any vitals not documented were not able to be obtained due to this being a telehealth visit or patient was unable to self-report a recent blood pressure reading due to a lack of equipment at home via telehealth. Vitals that have been documented are verbally provided by the patient.   Subjective:   Katherine Stevens is a 78 y.o. who presents for a Medicare Wellness preventive visit.  Visit Complete: Virtual I connected with  Katherine Stevens on 07/28/23 by a audio enabled telemedicine application and verified that I am speaking with the correct person using two identifiers.  Patient Location: Home  Provider Location: Office/Clinic  I discussed the limitations of evaluation and management by telemedicine. The patient expressed understanding and agreed to proceed.  Vital Signs: Because this visit was a virtual/telehealth visit, some criteria may be missing or patient reported. Any vitals not documented were not able to be obtained and vitals that have been documented are patient reported.  VideoDeclined- This patient declined Librarian, academic. Therefore the visit was completed with audio only.  Persons Participating in Visit: Patient.  AWV Questionnaire: No: Patient Medicare AWV questionnaire was not completed prior to this visit.  Cardiac Risk Factors include: advanced age (>79men, >66 women);sedentary lifestyle;diabetes mellitus;dyslipidemia;family history of premature cardiovascular disease;hypertension;obesity (BMI >30kg/m2)     Objective:    Today's Vitals   07/28/23 1429  Weight: 230 lb (104.3 kg)  Height: 5\' 6"  (1.676 m)  PainSc: 0-No pain   Body mass index is 37.12 kg/m.      07/28/2023    2:31 PM 07/23/2022    2:39 PM 07/09/2021    9:50 AM 07/08/2020    9:31 AM 07/03/2019    3:18 PM 03/31/2019   11:54 AM  Advanced Directives  Does Patient Have a Medical Advance Directive? Yes No No No No No  Type of Estate agent of [place];Living will       Copy of Healthcare Power of Attorney in Chart? No - copy requested       Would patient like information on creating a medical advance directive?   Yes (MAU/Ambulatory/Procedural Areas - Information given) No - Patient declined No - Patient declined     Current Medications (verified) Outpatient Encounter Medications as of 07/28/2023  Medication Sig   atorvastatin  (LIPITOR) 40 MG tablet Take 1 tablet (40 mg total) by mouth daily.   buPROPion  (WELLBUTRIN ) 75 MG tablet TAKE 1 TABLET BY MOUTH WITH BREAKFAST AND WITH LUNCH FOR MOOD OR SMOKING CESSATION.   Cholecalciferol (VITAMIN D3) 50 MCG (2000 UT) capsule Take 1 capsule (2,000 Units total) by mouth daily.   citalopram  (CELEXA ) 20 MG tablet TAKE 1 TABLET(20 MG) BY MOUTH DAILY   cyanocobalamin  (VITAMIN B12) 500 MCG tablet Take 1 tablet (500 mcg total) by mouth daily.   donepezil  (ARICEPT ) 10 MG tablet TAKE 1 TABLET(10 MG) BY MOUTH AT BEDTIME   levocetirizine (XYZAL ) 5 MG tablet TAKE 1 TABLET(5 MG) BY MOUTH EVERY EVENING   levothyroxine  (SYNTHROID ) 75 MCG tablet TAKE 1 TABLET(75 MCG) BY MOUTH DAILY BEFORE BREAKFAST   losartan  (COZAAR ) 50 MG tablet Take 1 tablet (50 mg total) by mouth daily.   metFORMIN  (GLUCOPHAGE ) 500 MG tablet TAKE 1 TABLET(500 MG) BY MOUTH DAILY  WITH BREAKFAST   oxybutynin  (DITROPAN -XL) 10 MG 24 hr tablet TAKE 1 TABLET(5 MG) BY MOUTH DAILY FOR OVERACTIVE BLADDER   pantoprazole  (PROTONIX ) 20 MG tablet Take 1 tablet (20 mg total) by mouth daily.   Travoprost, BAK Free, (TRAVATAN) 0.004 % SOLN ophthalmic solution 1 drop at bedtime.   traZODone  (DESYREL ) 50 MG tablet Take 0.5-1 tablets (25-50 mg total) by mouth at bedtime as needed for sleep.  (Patient not taking: Reported on 07/28/2023)   No facility-administered encounter medications on file as of 07/28/2023.    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   Depression    Diabetes mellitus without complication (HCC)    Hyperlipidemia    Hypertension    Memory loss    Overactive bladder    Thyroid  disease    Vertigo 04/16/2019   Past Surgical History:  Procedure Laterality Date   ABDOMINAL HYSTERECTOMY     Family History  Problem Relation Age of Onset   Heart failure Mother    Diabetes Mother    Diabetes Sister    Hyperlipidemia Sister    Thyroid  disease Sister    Stroke Brother    Heart attack Brother    Cancer Maternal Grandmother    Heart attack Brother    Social History   Socioeconomic History   Marital status: Divorced    Spouse name: Not on file   Number of children: 2   Years of education: Not on file   Highest education level: GED or equivalent  Occupational History   Not on file  Tobacco Use   Smoking status: Every Day    Current packs/day: 0.25    Types: Cigarettes   Smokeless tobacco: Never   Tobacco comments:    Reports smoking approximately 5-6 cigarettes a day  Vaping Use   Vaping status: Never Used  Substance and Sexual Activity   Alcohol use: Not Currently   Drug use: Not Currently   Sexual activity: Not Currently  Other Topics Concern   Not on file  Social History Narrative   Pt lives alone   Social Drivers of Health   Financial Resource Strain: Low Risk  (07/28/2023)   Overall Financial Resource Strain (CARDIA)    Difficulty of Paying Living Expenses: Not very hard  Food Insecurity: No Food Insecurity (07/28/2023)   Hunger Vital Sign    Worried About Running Out of Food in the Last Year: Never true    Ran Out of Food in the Last Year: Never true  Transportation Needs: No Transportation Needs  (07/28/2023)   PRAPARE - Administrator, Civil Service (Medical): No    Lack of Transportation (Non-Medical): No  Physical Activity: Inactive (07/28/2023)   Exercise Vital Sign    Days of Exercise per Week: 0 days    Minutes of Exercise per Session: 0 min  Stress: No Stress Concern Present (07/28/2023)   [employer] of Occupational Health - Occupational Stress Questionnaire    Feeling of Stress : Only a little  Social Connections: Moderately Integrated (07/28/2023)   Social Connection and Isolation Panel [NHANES]    Frequency of Communication with Friends and Family: More than three times a week    Frequency of Social Gatherings with Friends and Family: More than three times a week    Attends Religious Services: 1 to 4 times per year  Active Member of Clubs or Organizations: Yes    Attends Banker Meetings: 1 to 4 times per year    Marital Status: Divorced    Tobacco Counseling Ready to quit: Not Answered Counseling given: Not Answered Tobacco comments: Reports smoking approximately 5-6 cigarettes a day    Clinical Intake:     Pain Stevens: 0-No pain        Lab Results  Component Value Date   HGBA1C 6.7 (H) 02/15/2023   HGBA1C 6.6 (H) 11/15/2022   HGBA1C 6.6 (H) 05/17/2022               Activities of Daily Living     07/28/2023    2:31 PM 02/15/2023    1:30 PM  In your present state of health, do you have any difficulty performing the following activities:  Hearing? 0 0  Vision? 0 0  Difficulty concentrating or making decisions? 0 1  Walking or climbing stairs? 1 1  Dressing or bathing? 0 0  Doing errands, shopping? 0 0  Preparing Food and eating ? N   Using the Toilet? N   In the past six months, have you accidently leaked urine? N   Do you have problems with loss of bowel control? N   Managing your Medications? N   Managing your Finances? N   Housekeeping or managing your Housekeeping? N     Patient Care Team: Tapia,  Leisa, PA-C as PCP - General (Family Medicine) Konz, Autumn Kari, PA-C (Neurology) Marnee Sink, MD as Consulting Physician (Gastroenterology) Rosan Comfort, MD as Consulting Physician (Neurology) Ric Chain as Consulting Physician (Optometry)  Indicate any recent Medical Services you may have received from other than [facility] providers in the past year (date may be approximate).     Assessment:   This is a routine wellness examination for Katherine Stevens.  Hearing/Vision screen Hearing Screening - Comments:: Adequate hearing. Vision Screening - Comments:: .Adequate vision with use of eyeglasses.  Last eye exam completed by Dr. Griselda Lederer   Goals Addressed             This Visit's Progress    Client understands the importance of follow-up with providers by attending scheduled visits         Depression Screen     07/28/2023    2:32 PM 02/15/2023    1:30 PM 10/15/2022    1:47 PM 07/23/2022    2:36 PM 07/16/2022    2:07 PM 07/14/2022   10:17 AM 07/12/2022   11:23 AM  PHQ 2/9 Scores  PHQ - 2 Stevens 0 0 0 0 0 0 1  PHQ- 9 Stevens 5 0 0 0 0      Fall Risk     07/28/2023    2:31 PM 02/15/2023    1:29 PM 10/15/2022    1:47 PM 07/23/2022    2:34 PM 07/16/2022    2:07 PM  Fall Risk   Falls in the past year? 0 0 0 0 0  Number falls in past yr: 0 0 0 0 0  Injury with Fall? 0 0 0 0 0  Risk for fall due to : No Fall Risks No Fall Risks No Fall Risks No Fall Risks No Fall Risks  Follow up Falls prevention discussed;Falls evaluation completed Falls prevention discussed;Education provided;Falls evaluation completed Falls prevention discussed;Education provided;Falls evaluation completed Education provided;Falls prevention discussed Falls prevention discussed;Education provided;Falls evaluation completed    MEDICARE RISK AT HOME:  Medicare Risk at Home Any  stairs in or around the home?: No If so, are there any without handrails?: No Home free of loose throw rugs in walkways, pet beds,  electrical cords, etc?: Yes Adequate lighting in your home to reduce risk of falls?: Yes Life alert?: No Use of a cane, walker or w/c?: Yes Grab bars in the bathroom?: Yes Shower chair or bench in shower?: Yes Elevated toilet seat or a handicapped toilet?: Yes  TIMED UP AND GO:  Was the test performed?  No  Cognitive Function: Impaired: Patient has current diagnosis of cognitive impairment.    07/28/2023    2:34 PM  MMSE - Mini Mental State Exam  Not completed: Unable to complete        07/23/2022    2:44 PM 07/03/2019    3:24 PM  6CIT Screen  What Year? 0 points 0 points  What month? 0 points 0 points  What time? 0 points 0 points  Count back from 20 0 points 0 points  Months in reverse 0 points 0 points  Repeat phrase 0 points 2 points  Total Stevens 0 points 2 points    Immunizations Immunization History  Administered Date(s) Administered   Influenza,inj,Quad PF,6+ Mos 03/20/2015, 12/12/2015, 01/20/2017, 02/15/2018, 01/04/2019   Moderna Sars-Covid-2 Vaccination 03/25/2020, 05/01/2020, 10/24/2020, 12/26/2020, 02/11/2022   Pneumococcal Conjugate-13 09/13/2013   Pneumococcal Polysaccharide-23 10/30/2014    Screening Tests Health Maintenance  Topic Date Due   DTaP/Tdap/Td (1 - Tdap) Never done   COVID-19 Vaccine (6 - 2024-25 season) 12/05/2022   MAMMOGRAM  07/05/2023   HEMOGLOBIN A1C  08/15/2023   OPHTHALMOLOGY EXAM  10/01/2023   FOOT EXAM  10/15/2023   INFLUENZA VACCINE  11/04/2023   Diabetic kidney evaluation - eGFR measurement  02/15/2024   Diabetic kidney evaluation - Urine ACR  02/25/2024   DEXA SCAN  07/04/2024   Medicare Annual Wellness (AWV)  07/27/2024   Pneumonia Vaccine 54+ Years old  Completed   HPV VACCINES  Aged Out   Meningococcal B Vaccine  Aged Out   Hepatitis C Screening  Discontinued   Zoster Vaccines- Shingrix  Discontinued    Health Maintenance  Health Maintenance Due  Topic Date Due   DTaP/Tdap/Td (1 - Tdap) Never done   COVID-19  Vaccine (6 - 2024-25 season) 12/05/2022   MAMMOGRAM  07/05/2023   Health Maintenance Items Addressed: Yes  Additional Screening:  Vision Screening: Recommended annual ophthalmology exams for early detection of glaucoma and other disorders of the eye.  Dental Screening: Recommended annual dental exams for proper oral hygiene  Community Resource Referral / Chronic Care Management: CRR required this visit?  No   CCM required this visit?  No     Plan:     I have personally reviewed and noted the following in the patient's chart:   Medical and social history Use of alcohol, tobacco or illicit drugs  Current medications and supplements including opioid prescriptions. Patient is not currently taking opioid prescriptions. Functional ability and status Nutritional status Physical activity Advanced directives List of other physicians Hospitalizations, surgeries, and ER visits in previous 12 months Vitals Screenings to include cognitive, depression, and falls Referrals and appointments  In addition, I have reviewed and discussed with patient certain preventive protocols, quality metrics, and best practice recommendations. A written personalized care plan for preventive services as well as general preventive health recommendations were provided to patient.     Katherine Sheldon, LPN   05/05/8655   After Visit Summary: (MyChart) Due to this being  a telephonic visit, the after visit summary with patients personalized plan was offered to patient via MyChart   Notes: Please refer to Routing Comments.

## 2023-07-29 ENCOUNTER — Ambulatory Visit: Payer: Self-pay | Admitting: Family Medicine

## 2023-07-29 ENCOUNTER — Encounter: Payer: Self-pay | Admitting: Family Medicine

## 2023-07-29 VITALS — BP 128/74 | HR 89 | Resp 16 | Ht 66.0 in | Wt 226.6 lb

## 2023-07-29 DIAGNOSIS — E039 Hypothyroidism, unspecified: Secondary | ICD-10-CM

## 2023-07-29 DIAGNOSIS — J302 Other seasonal allergic rhinitis: Secondary | ICD-10-CM | POA: Diagnosis not present

## 2023-07-29 DIAGNOSIS — Z1231 Encounter for screening mammogram for malignant neoplasm of breast: Secondary | ICD-10-CM

## 2023-07-29 DIAGNOSIS — E785 Hyperlipidemia, unspecified: Secondary | ICD-10-CM

## 2023-07-29 DIAGNOSIS — E559 Vitamin D deficiency, unspecified: Secondary | ICD-10-CM

## 2023-07-29 DIAGNOSIS — Z716 Tobacco abuse counseling: Secondary | ICD-10-CM

## 2023-07-29 DIAGNOSIS — K219 Gastro-esophageal reflux disease without esophagitis: Secondary | ICD-10-CM

## 2023-07-29 DIAGNOSIS — E119 Type 2 diabetes mellitus without complications: Secondary | ICD-10-CM | POA: Diagnosis not present

## 2023-07-29 DIAGNOSIS — I10 Essential (primary) hypertension: Secondary | ICD-10-CM

## 2023-07-29 DIAGNOSIS — E538 Deficiency of other specified B group vitamins: Secondary | ICD-10-CM

## 2023-07-29 DIAGNOSIS — Z7984 Long term (current) use of oral hypoglycemic drugs: Secondary | ICD-10-CM

## 2023-07-29 DIAGNOSIS — F320 Major depressive disorder, single episode, mild: Secondary | ICD-10-CM

## 2023-07-29 DIAGNOSIS — F17200 Nicotine dependence, unspecified, uncomplicated: Secondary | ICD-10-CM

## 2023-07-29 DIAGNOSIS — M170 Bilateral primary osteoarthritis of knee: Secondary | ICD-10-CM

## 2023-07-29 DIAGNOSIS — F99 Mental disorder, not otherwise specified: Secondary | ICD-10-CM

## 2023-07-29 DIAGNOSIS — E66812 Obesity, class 2: Secondary | ICD-10-CM | POA: Diagnosis not present

## 2023-07-29 DIAGNOSIS — F0150 Vascular dementia without behavioral disturbance: Secondary | ICD-10-CM

## 2023-07-29 DIAGNOSIS — N3281 Overactive bladder: Secondary | ICD-10-CM

## 2023-07-29 DIAGNOSIS — F5105 Insomnia due to other mental disorder: Secondary | ICD-10-CM

## 2023-07-29 LAB — POCT GLYCOSYLATED HEMOGLOBIN (HGB A1C): Hemoglobin A1C: 5.9 % — AB (ref 4.0–5.6)

## 2023-07-29 MED ORDER — QUETIAPINE FUMARATE 25 MG PO TABS: 12.5000 mg | ORAL_TABLET | Freq: Every evening | ORAL | 0 refills | Status: AC | PRN

## 2023-07-29 MED ORDER — OXYBUTYNIN CHLORIDE ER 10 MG PO TB24: ORAL_TABLET | ORAL | 1 refills | Status: DC

## 2023-07-29 MED ORDER — LEVOTHYROXINE SODIUM 75 MCG PO TABS: ORAL_TABLET | ORAL | 1 refills | Status: DC

## 2023-07-29 MED ORDER — BUPROPION HCL 75 MG PO TABS: ORAL_TABLET | ORAL | 1 refills | Status: DC

## 2023-07-29 MED ORDER — PANTOPRAZOLE SODIUM 20 MG PO TBEC: 20.0000 mg | DELAYED_RELEASE_TABLET | Freq: Every day | ORAL | 1 refills | Status: DC

## 2023-07-29 MED ORDER — VITAMIN D3 50 MCG (2000 UT) PO CAPS: 2000.0000 [IU] | ORAL_CAPSULE | Freq: Every day | ORAL | 3 refills | Status: AC

## 2023-07-29 MED ORDER — DONEPEZIL HCL 10 MG PO TABS: ORAL_TABLET | ORAL | 1 refills | Status: DC

## 2023-07-29 MED ORDER — ATORVASTATIN CALCIUM 40 MG PO TABS: 40.0000 mg | ORAL_TABLET | Freq: Every day | ORAL | 2 refills | Status: DC

## 2023-07-29 MED ORDER — CYANOCOBALAMIN 500 MCG PO TABS: 500.0000 ug | ORAL_TABLET | Freq: Every day | ORAL | 1 refills | Status: AC

## 2023-07-29 MED ORDER — METFORMIN HCL 500 MG PO TABS: ORAL_TABLET | ORAL | 1 refills | Status: DC

## 2023-07-29 NOTE — Progress Notes (Signed)
 Name: Katherine Stevens   MRN: [id number]    DOB: December 30, 1945   Date:07/29/2023       Progress Note  Chief Complaint  Patient presents with   Medical Management of Chronic Issues     Subjective:   Katherine Stevens is a 78 y.o. female, presents to clinic for routine follow up on chronic conditions  Here for routine f/up on multiple Dx, DM well controlled T2, HTN, HLD, insomnia, anxiety, smoking managed with wellbutrin , OAB, HLD on statin and last labs checked well controlled on current meds  She is here alone with all her meds    Current Outpatient Medications:    atorvastatin  (LIPITOR) 40 MG tablet, Take 1 tablet (40 mg total) by mouth daily., Disp: 90 tablet, Rfl: 1   buPROPion  (WELLBUTRIN ) 75 MG tablet, TAKE 1 TABLET BY MOUTH WITH BREAKFAST AND WITH LUNCH FOR MOOD OR SMOKING CESSATION., Disp: 180 tablet, Rfl: 1   Cholecalciferol (VITAMIN D3) 50 MCG (2000 UT) capsule, Take 1 capsule (2,000 Units total) by mouth daily., Disp: 90 capsule, Rfl: 3   citalopram  (CELEXA ) 20 MG tablet, TAKE 1 TABLET(20 MG) BY MOUTH DAILY, Disp: 90 tablet, Rfl: 1   cyanocobalamin  (VITAMIN B12) 500 MCG tablet, Take 1 tablet (500 mcg total) by mouth daily., Disp: 90 tablet, Rfl: 1   donepezil  (ARICEPT ) 10 MG tablet, TAKE 1 TABLET(10 MG) BY MOUTH AT BEDTIME, Disp: 90 tablet, Rfl: 0   levocetirizine (XYZAL ) 5 MG tablet, TAKE 1 TABLET(5 MG) BY MOUTH EVERY EVENING, Disp: 90 tablet, Rfl: 1   levothyroxine  (SYNTHROID ) 75 MCG tablet, TAKE 1 TABLET(75 MCG) BY MOUTH DAILY BEFORE BREAKFAST, Disp: 90 tablet, Rfl: 3   losartan  (COZAAR ) 50 MG tablet, Take 1 tablet (50 mg total) by mouth daily., Disp: 90 tablet, Rfl: 1   metFORMIN  (GLUCOPHAGE ) 500 MG tablet, TAKE 1 TABLET(500 MG) BY MOUTH DAILY WITH BREAKFAST, Disp: 90 tablet, Rfl: 0   oxybutynin  (DITROPAN -XL) 10 MG 24 hr tablet, TAKE 1 TABLET(5 MG) BY MOUTH DAILY FOR OVERACTIVE BLADDER, Disp: 90 tablet, Rfl: 1   pantoprazole  (PROTONIX ) 20 MG tablet, Take 1 tablet (20 mg  total) by mouth daily., Disp: 90 tablet, Rfl: 1   Travoprost, BAK Free, (TRAVATAN) 0.004 % SOLN ophthalmic solution, 1 drop at bedtime., Disp: , Rfl:    traZODone  (DESYREL ) 50 MG tablet, Take 0.5-1 tablets (25-50 mg total) by mouth at bedtime as needed for sleep., Disp: 30 tablet, Rfl: 0  Patient Active Problem List   Diagnosis Date Noted   Stage 3a chronic kidney disease (HCC) 05/17/2022   B12 deficiency 05/17/2022   Vitamin D  deficiency 11/20/2020   Insomnia due to other mental disorder 05/01/2020   OAB (overactive bladder) 05/01/2020   Osteopenia 05/01/2020   History of hepatitis C 05/01/2020   Abnormality of gait and mobility 05/01/2020   Current mild episode of major depressive disorder (HCC) 04/16/2019   Type 2 diabetes mellitus without complication, without long-term current use of insulin (HCC) 04/16/2019   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/16/2019   Folate deficiency 04/16/2019   Vitamin B1 deficiency 04/16/2019   GERD (gastroesophageal reflux disease) 11/22/2018   Tobacco dependence 12/12/2015   Hyperlipidemia 12/10/2014   Osteoarthritis of both knees 08/01/2012   Seasonal allergies 07/28/2012   Class 2 severe obesity with serious comorbidity and body mass index (BMI) of 37.0 to 37.9 in adult [facility]) 07/29/2011   Hypothyroidism 12/20/2008   Hypertension, benign 12/20/2008    Past Surgical History:  Procedure Laterality Date  ABDOMINAL HYSTERECTOMY      Family History  Problem Relation Age of Onset   Heart failure Mother    Diabetes Mother    Diabetes Sister    Hyperlipidemia Sister    Thyroid  disease Sister    Stroke Brother    Heart attack Brother    Cancer Maternal Grandmother    Heart attack Brother     Social History   Tobacco Use   Smoking status: Every Day    Current packs/day: 0.25    Types: Cigarettes   Smokeless tobacco: Never   Tobacco comments:    Reports smoking approximately 5-6 cigarettes a day  Vaping Use   Vaping status:  Never Used  Substance Use Topics   Alcohol use: Not Currently   Drug use: Not Currently     Allergies  Allergen Reactions   Lisinopril Anaphylaxis    MOUTH & JAW SWELLING    Health Maintenance  Topic Date Due   DTaP/Tdap/Td (1 - Tdap) Never done   MAMMOGRAM  07/05/2023   COVID-19 Vaccine (6 - 2024-25 season) 08/14/2023 (Originally 12/05/2022)   HEMOGLOBIN A1C  08/15/2023   OPHTHALMOLOGY EXAM  10/01/2023   FOOT EXAM  10/15/2023   INFLUENZA VACCINE  11/04/2023   Diabetic kidney evaluation - eGFR measurement  02/15/2024   Diabetic kidney evaluation - Urine ACR  02/25/2024   DEXA SCAN  07/04/2024   Medicare Annual Wellness (AWV)  07/27/2024   Pneumonia Vaccine 56+ Years old  Completed   HPV VACCINES  Aged Out   Meningococcal B Vaccine  Aged Out   Hepatitis C Screening  Discontinued   Zoster Vaccines- Shingrix  Discontinued    Chart Review Today: I personally reviewed active problem list, medication list, allergies, family history, social history, health maintenance, notes from last encounter, lab results, imaging with the patient/caregiver today.   Review of Systems  Constitutional: Negative.   HENT: Negative.    Eyes: Negative.   Respiratory: Negative.    Cardiovascular: Negative.   Gastrointestinal: Negative.   Endocrine: Negative.   Genitourinary: Negative.   Musculoskeletal: Negative.   Skin: Negative.   Allergic/Immunologic: Negative.   Neurological: Negative.   Hematological: Negative.   Psychiatric/Behavioral: Negative.    All other systems reviewed and are negative.    Objective:   Vitals:   07/29/23 1317  BP: 128/74  Pulse: 89  Resp: 16  SpO2: 97%  Weight: 226 lb 9.6 oz (102.8 kg)  Height: 5\' 6"  (1.676 m)    Body mass index is 36.57 kg/m.  Physical Exam Vitals and nursing note reviewed.  Constitutional:      General: She is not in acute distress.    Appearance: Normal appearance. She is well-developed and well-groomed. She is not  ill-appearing, toxic-appearing or diaphoretic.     Comments: Elderly, well appearing  HENT:     Head: Normocephalic and atraumatic.     Nose: Nose normal.  Eyes:     General:        Right eye: No discharge.        Left eye: No discharge.     Conjunctiva/sclera: Conjunctivae normal.  Neck:     Trachea: No tracheal deviation.  Cardiovascular:     Rate and Rhythm: Normal rate and regular rhythm.     Pulses: Normal pulses.     Heart sounds: Normal heart sounds. No murmur heard.    No friction rub. No gallop.  Pulmonary:     Effort: Pulmonary effort is normal. No respiratory  distress.     Breath sounds: Normal breath sounds. No stridor.  Musculoskeletal:     Right lower leg: No edema.     Left lower leg: No edema.  Skin:    General: Skin is warm and dry.     Findings: No rash.  Neurological:     Mental Status: She is alert.     Motor: No abnormal muscle tone.     Coordination: Coordination normal.     Gait: Gait abnormal (walks with cane).  Psychiatric:        Behavior: Behavior normal. Behavior is cooperative.      Functional Status Survey:   Results for orders placed or performed in visit on 02/15/23  TSH   Collection Time: 02/15/23  2:06 PM  Result Value Ref Range   TSH 0.88 0.40 - 4.50 mIU/L  T4   Collection Time: 02/15/23  2:06 PM  Result Value Ref Range   T4, Total 6.2 5.1 - 11.9 mcg/dL  CBC w/Diff/Platelet   Collection Time: 02/15/23  2:06 PM  Result Value Ref Range   WBC 9.5 3.8 - 10.8 Thousand/uL   RBC 4.37 3.80 - 5.10 Million/uL   Hemoglobin 12.5 11.7 - 15.5 g/dL   HCT 78.2 95.6 - 21.3 %   MCV 87.9 80.0 - 100.0 fL   MCH 28.6 27.0 - 33.0 pg   MCHC 32.6 32.0 - 36.0 g/dL   RDW 08.6 57.8 - 46.9 %   Platelets 274 140 - 400 Thousand/uL   MPV 10.9 7.5 - 12.5 fL   Neutro Abs 5,406 1,500 - 7,800 cells/uL   Absolute Lymphocytes 3,268 850 - 3,900 cells/uL   Absolute Monocytes 656 200 - 950 cells/uL   Eosinophils Absolute 124 15 - 500 cells/uL   Basophils  Absolute 48 0 - 200 cells/uL   Neutrophils Relative % 56.9 %   Total Lymphocyte 34.4 %   Monocytes Relative 6.9 %   Eosinophils Relative 1.3 %   Basophils Relative 0.5 %  HgB A1c   Collection Time: 02/15/23  2:06 PM  Result Value Ref Range   Hgb A1c MFr Bld 6.7 (H) <5.7 % of total Hgb   Mean Plasma Glucose 146 mg/dL   eAG (mmol/L) 8.1 mmol/L  COMPLETE METABOLIC PANEL WITH GFR   Collection Time: 02/15/23  2:06 PM  Result Value Ref Range   Glucose, Bld 119 (H) 65 - 99 mg/dL   BUN 10 7 - 25 mg/dL   Creat 6.29 5.28 - 4.13 mg/dL   eGFR 65 > OR = 60 KG/MWN/0.27O5   BUN/Creatinine Ratio SEE NOTE: 6 - 22 (calc)   Sodium 140 135 - 146 mmol/L   Potassium 4.4 3.5 - 5.3 mmol/L   Chloride 106 98 - 110 mmol/L   CO2 26 20 - 32 mmol/L   Calcium  9.2 8.6 - 10.4 mg/dL   Total Protein 7.7 6.1 - 8.1 g/dL   Albumin 4.1 3.6 - 5.1 g/dL   Globulin 3.6 1.9 - 3.7 g/dL (calc)   AG Ratio 1.1 1.0 - 2.5 (calc)   Total Bilirubin 0.3 0.2 - 1.2 mg/dL   Alkaline phosphatase (APISO) 134 37 - 153 U/L   AST 19 10 - 35 U/L   ALT 12 6 - 29 U/L  Lipid Profile   Collection Time: 02/15/23  2:06 PM  Result Value Ref Range   Cholesterol 117 <200 mg/dL   HDL 60 > OR = 50 mg/dL   Triglycerides 80 <366 mg/dL   LDL Cholesterol (Calc)  41 mg/dL (calc)   Total CHOL/HDL Ratio 2.0 <5.0 (calc)   Non-HDL Cholesterol (Calc) 57 <161 mg/dL (calc)  Microalbumin / creatinine urine ratio   Collection Time: 02/25/23  2:08 PM  Result Value Ref Range   Creatinine, Urine 30 20 - 275 mg/dL   Microalb, Ur 0.2 mg/dL   Microalb Creat Ratio 7 <30 mg/g creat      Assessment & Plan:   1. Encounter for screening mammogram for malignant neoplasm of breast  - MM 3D SCREENING MAMMOGRAM BILATERAL BREAST; Future  2. Class 2 severe obesity with serious comorbidity and body mass index (BMI) of 37.0 to 37.9 in adult, unspecified obesity type (HCC) Weight fairly stable Wt Readings from Last 5 Encounters:  07/29/23 226 lb 9.6 oz (102.8  kg)  07/28/23 230 lb (104.3 kg)  04/05/23 220 lb (99.8 kg)  02/15/23 227 lb 14.4 oz (103.4 kg)  10/15/22 229 lb (103.9 kg)   BMI Readings from Last 5 Encounters:  07/29/23 36.57 kg/m  07/28/23 37.12 kg/m  04/05/23 35.51 kg/m  02/15/23 36.23 kg/m  10/15/22 36.41 kg/m     3. Seasonal allergies Managed with xyzal , sx well controlled  4. Tobacco dependence Still smoking a few cig a day, wellbutrin  helps her not smoke more but she hasn't been able to stop all the way - buPROPion  (WELLBUTRIN ) 75 MG tablet; TAKE 1 TABLET BY MOUTH WITH BREAKFAST AND WITH LUNCH FOR MOOD OR SMOKING CESSATION.  Dispense: 180 tablet; Refill: 1  5. Osteoarthritis of both knees, unspecified osteoarthritis type Walks with cane, sx stable  6. Current mild episode of major depressive disorder, unspecified whether recurrent (HCC)    07/28/2023    2:32 PM 02/15/2023    1:30 PM 10/15/2022    1:47 PM  Depression screen PHQ 2/9  Decreased Interest 0 0 0  Down, Depressed, Hopeless 0 0 0  PHQ - 2 Score 0 0 0  Altered sleeping 1 0 0  Tired, decreased energy 1 0 0  Change in appetite 0 0 0  Feeling bad or failure about yourself  0 0 0  Trouble concentrating 3 0 0  Moving slowly or fidgety/restless 0 0 0  Suicidal thoughts 0 0 0  PHQ-9 Score 5 0 0  Difficult doing work/chores Not difficult at all Not difficult at all Not difficult at all  Phq 9 positive, mild sx, on citalopram  and wellbutrin  - buPROPion  (WELLBUTRIN ) 75 MG tablet; TAKE 1 TABLET BY MOUTH WITH BREAKFAST AND WITH LUNCH FOR MOOD OR SMOKING CESSATION.  Dispense: 180 tablet; Refill: 1  7. Type 2 diabetes mellitus without complication, without long-term current use of insulin (HCC) Has been well controlled with only 500 mg metformin  which she tolerates In the past she did have some GI SE and med dose reduced as labs allowed - metFORMIN  (GLUCOPHAGE ) 500 MG tablet; TAKE 1 TABLET(500 MG) BY MOUTH DAILY WITH BREAKFAST  Dispense: 90 tablet; Refill: 1 -  POCT glycosylated hemoglobin (Hb A1C) Results for orders placed or performed in visit on 07/29/23  POCT glycosylated hemoglobin (Hb A1C)   Collection Time: 07/29/23  1:51 PM  Result Value Ref Range   Hemoglobin A1C 5.9 (A) 4.0 - 5.6 %   HbA1c POC (<> result, manual entry)     HbA1c, POC (prediabetic range)     HbA1c, POC (controlled diabetic range)       8. Hypothyroidism, unspecified type Lab Results  Component Value Date   TSH 0.88 02/15/2023  Pt euvolemic, last labs  reviewed and in normal limits - levothyroxine  (SYNTHROID ) 75 MCG tablet; TAKE 1 TABLET(75 MCG) BY MOUTH DAILY BEFORE BREAKFAST  Dispense: 90 tablet; Refill: 1  9. Hyperlipidemia, unspecified hyperlipidemia type Good statin compliance no side effects or concerns, reviewed last labs and ordered refills Lab Results  Component Value Date   CHOL 117 02/15/2023   HDL 60 02/15/2023   LDLCALC 41 02/15/2023   TRIG 80 02/15/2023   CHOLHDL 2.0 02/15/2023   - atorvastatin  (LIPITOR) 40 MG tablet; Take 1 tablet (40 mg total) by mouth daily.  Dispense: 90 tablet; Refill: 2  10. Gastroesophageal reflux disease, unspecified whether esophagitis present Sx controlled, encouraged her to skip or wean off ppi when able, can also use tums or pepcid prn - pantoprazole  (PROTONIX ) 20 MG tablet; Take 1 tablet (20 mg total) by mouth daily.  Dispense: 90 tablet; Refill: 1  11. Hypertension, benign (Primary) On losartan  25 mg daily, good compliance and blood pressure is at goal today BP Readings from Last 3 Encounters:  07/29/23 128/74  04/05/23 122/74  02/15/23 138/82    12. Insomnia due to other mental disorder She was given trazodone  by a different prescriber and float provider but she prefers Seroquel  and in the past has used a very low conservative amount only occasionally, DC trazodone  and restart Seroquel  (patient reports using about a quarter of a tablet) Additionally in the past when she took Wellbutrin  too late in the day this  would sometimes cause her insomnia so we have avoided that and her symptoms seem to be much better controlled than a few years ago - QUEtiapine  (SEROQUEL ) 25 MG tablet; Take 0.5-1 tablets (12.5-25 mg total) by mouth at bedtime as needed.  Dispense: 60 tablet; Refill: 0  13. Mixed Alzheimer's and vascular dementia [facility]) Managing with neurology at [facility] her Aricept  dose is currently higher and refilled per neurology  14. OAB (overactive bladder) Refills, sx stable - oxybutynin  (DITROPAN -XL) 10 MG 24 hr tablet; TAKE 1 TABLET(5 MG) BY MOUTH DAILY FOR OVERACTIVE BLADDER  Dispense: 90 tablet; Refill: 1  15. B12 deficiency refills - cyanocobalamin  (VITAMIN B12) 500 MCG tablet; Take 1 tablet (500 mcg total) by mouth daily.  Dispense: 90 tablet; Refill: 1  16. Vitamin D  deficiency refills - Cholecalciferol (VITAMIN D3) 50 MCG (2000 UT) capsule; Take 1 capsule (2,000 Units total) by mouth daily.  Dispense: 90 capsule; Refill: 3    Return in about 6 months (around 01/28/2024) for Routine follow-up.   Adeline Hone, PA-C 07/29/23 1:20 PM

## 2023-07-29 NOTE — Patient Instructions (Signed)
 [facility] at [facility] [address], [place], [place] [address] Scheduling phone #: [phone] Call the schedule your mammogram - it has been ordered   Health Maintenance  Topic Date Due   DTaP/Tdap/Td vaccine (1 - Tdap) Never done   Mammogram  07/05/2023   COVID-19 Vaccine (6 - 2024-25 season) 08/14/2023*   Hemoglobin A1C  08/15/2023   Eye exam for diabetics  10/01/2023   Complete foot exam   10/15/2023   Flu Shot  11/04/2023   Yearly kidney function blood test for diabetes  02/15/2024   Yearly kidney health urinalysis for diabetes  02/25/2024   DEXA scan (bone density measurement)  07/04/2024   Medicare Annual Wellness Visit  07/27/2024   Pneumonia Vaccine  Completed   HPV Vaccine  Aged Out   Meningitis B Vaccine  Aged Out   Hepatitis C Screening  Discontinued   Zoster (Shingles) Vaccine  Discontinued  *Topic was postponed. The date shown is not the original due date.

## 2023-08-11 ENCOUNTER — Encounter: Payer: Self-pay | Admitting: Family Medicine

## 2023-09-14 DIAGNOSIS — R251 Tremor, unspecified: Secondary | ICD-10-CM | POA: Diagnosis not present

## 2023-09-14 DIAGNOSIS — G309 Alzheimer's disease, unspecified: Secondary | ICD-10-CM | POA: Diagnosis not present

## 2023-09-14 DIAGNOSIS — F5101 Primary insomnia: Secondary | ICD-10-CM | POA: Diagnosis not present

## 2023-09-14 LAB — COMPREHENSIVE METABOLIC PANEL WITH GFR: eGFR: 42

## 2023-09-14 LAB — BASIC METABOLIC PANEL WITH GFR
BUN: 15 (ref 4–21)
Creatinine: 1.3 — AB (ref 0.5–1.1)

## 2023-09-15 ENCOUNTER — Other Ambulatory Visit: Payer: Self-pay

## 2023-09-15 DIAGNOSIS — R251 Tremor, unspecified: Secondary | ICD-10-CM

## 2023-09-19 ENCOUNTER — Ambulatory Visit
Admission: RE | Admit: 2023-09-19 | Discharge: 2023-09-19 | Disposition: A | Discharge status: Home / Self Care | Source: Ambulatory Visit

## 2023-09-19 DIAGNOSIS — R9082 White matter disease, unspecified: Secondary | ICD-10-CM | POA: Diagnosis not present

## 2023-09-19 DIAGNOSIS — R251 Tremor, unspecified: Secondary | ICD-10-CM | POA: Diagnosis not present

## 2023-09-19 DIAGNOSIS — G319 Degenerative disease of nervous system, unspecified: Secondary | ICD-10-CM | POA: Diagnosis not present

## 2023-10-21 ENCOUNTER — Telehealth: Payer: Self-pay

## 2023-10-21 NOTE — Telephone Encounter (Signed)
 Letter written. Called to notify patient w/no answer. Lvm asking if she wants to pick-up copy or have us  mail it to her. Placed at front desk until then.

## 2023-10-21 NOTE — Telephone Encounter (Signed)
 Copied from CRM [id number]. Topic: General - Other >> Oct 21, 2023  2:04 PM Fonda T wrote: Reason for CRM: Patient calling requesting a written and signed note to be excused from jury duty, which is scheduled for 11/07/23.  Patient is requesting a return call to discuss further, at [phone].  Thank you

## 2023-10-24 NOTE — Telephone Encounter (Unsigned)
 Copied from CRM [id number]. Topic: General - Other >> Oct 21, 2023  2:04 PM Fonda T wrote: Reason for CRM: Patient calling requesting a written and signed note to be excused from jury duty, which is scheduled for 11/07/23.  Patient is requesting a return call to discuss further, at [phone].  Thank you >> Oct 24, 2023 10:23 AM Larissa RAMAN wrote: Patient returning missed call from [facility], states she will come to the office to pickup letter.

## 2023-11-08 ENCOUNTER — Other Ambulatory Visit: Payer: Self-pay | Admitting: Family Medicine

## 2023-11-08 DIAGNOSIS — F17200 Nicotine dependence, unspecified, uncomplicated: Secondary | ICD-10-CM

## 2023-11-08 DIAGNOSIS — F320 Major depressive disorder, single episode, mild: Secondary | ICD-10-CM

## 2023-11-10 NOTE — Telephone Encounter (Signed)
 Requested Prescriptions  Pending Prescriptions Disp Refills   buPROPion  (WELLBUTRIN ) 75 MG tablet [Pharmacy Med Name: BUPROPION  75MG  TABLETS] 180 tablet 0    Sig: TAKE 1 TABLET BY MOUTH WITH BREAKFAST AND WITH LUNCH FOR MOOD OR SMOKING CESSATION.     Psychiatry: Antidepressants - bupropion  Passed - 11/10/2023  1:08 PM      Passed - Cr in normal range and within 360 days    Creat  Date Value Ref Range Status  02/15/2023 0.91 0.60 - 1.00 mg/dL Final   Creatinine, Urine  Date Value Ref Range Status  02/25/2023 30 20 - 275 mg/dL Final         Passed - AST in normal range and within 360 days    AST  Date Value Ref Range Status  02/15/2023 19 10 - 35 U/L Final         Passed - ALT in normal range and within 360 days    ALT  Date Value Ref Range Status  02/15/2023 12 6 - 29 U/L Final         Passed - Completed PHQ-2 or PHQ-9 in the last 360 days      Passed - Last BP in normal range    BP Readings from Last 1 Encounters:  07/29/23 128/74         Passed - Valid encounter within last 6 months    Recent Outpatient Visits           3 months ago Hypertension, benign   [facility] Leavy Mole, PA-C

## 2023-12-06 ENCOUNTER — Other Ambulatory Visit: Payer: Self-pay

## 2023-12-06 ENCOUNTER — Telehealth: Payer: Self-pay | Admitting: Family Medicine

## 2023-12-06 DIAGNOSIS — F419 Anxiety disorder, unspecified: Secondary | ICD-10-CM

## 2023-12-06 DIAGNOSIS — F320 Major depressive disorder, single episode, mild: Secondary | ICD-10-CM

## 2023-12-06 MED ORDER — CITALOPRAM HYDROBROMIDE 20 MG PO TABS: 20.0000 mg | ORAL_TABLET | Freq: Every day | ORAL | 0 refills | Status: DC

## 2023-12-06 NOTE — Telephone Encounter (Signed)
citalopram (CELEXA) 20 MG tablet 

## 2023-12-06 NOTE — Telephone Encounter (Signed)
 Refill sent.

## 2023-12-16 ENCOUNTER — Other Ambulatory Visit: Payer: Self-pay | Admitting: Family Medicine

## 2023-12-16 DIAGNOSIS — F320 Major depressive disorder, single episode, mild: Secondary | ICD-10-CM

## 2023-12-16 DIAGNOSIS — F419 Anxiety disorder, unspecified: Secondary | ICD-10-CM

## 2023-12-16 NOTE — Telephone Encounter (Signed)
 Called pharmacy to verify rx called in - no one picked up after 5 minutes disconnected.

## 2023-12-16 NOTE — Telephone Encounter (Signed)
 Copied from CRM [id number]. Topic: Clinical - Medication Refill >> Dec 16, 2023 11:22 AM Avram MATSU wrote: Medication: citalopram  (CELEXA ) 20 MG tablet [[id number]]  Has the patient contacted their pharmacy? No (Agent: If no, request that the patient contact the pharmacy for the refill. If patient does not wish to contact the pharmacy document the reason why and proceed with request.) (Agent: If yes, when and what did the pharmacy advise?)  This is the patient's preferred pharmacy:  [id number] [place], [place] - [address] AT [facility] OF [address] [place] [address] [place] [address] Phone: [phone] Fax: [phone]  Is this the correct pharmacy for this prescription? Yes If no, delete pharmacy and type the correct one.   Has the prescription been filled recently? yes  Is the patient out of the medication? No 2 left  Has the patient been seen for an appointment in the last year OR does the patient have an upcoming appointment? Yes  Can we respond through MyChart? No  Agent: Please be advised that Rx refills may take up to 3 business days. We ask that you follow-up with your pharmacy.   ----------------------------------------------------------------------- From previous Reason for Contact - Scheduling: Patient/patient representative is calling to schedule an appointment. Refer to attachments for appointment information.

## 2023-12-21 ENCOUNTER — Other Ambulatory Visit: Payer: Self-pay | Admitting: Family Medicine

## 2023-12-21 ENCOUNTER — Telehealth: Payer: Self-pay

## 2023-12-21 DIAGNOSIS — F320 Major depressive disorder, single episode, mild: Secondary | ICD-10-CM

## 2023-12-21 DIAGNOSIS — F419 Anxiety disorder, unspecified: Secondary | ICD-10-CM

## 2023-12-21 NOTE — Telephone Encounter (Signed)
 Copied from CRM [id number]. Topic: Clinical - Medication Question >> Dec 21, 2023 11:44 AM Everette C wrote: Reason for CRM: The patient has requested to be contacted by a member of practice staff when possible to follow up on previously submitted refill requests for their prescription of citalopram  (CELEXA ) 20 MG tablet [[id number]].. Please contact the patient when possible

## 2023-12-21 NOTE — Telephone Encounter (Unsigned)
 Copied from CRM [id number]. Topic: Clinical - Medication Refill >> Dec 21, 2023 11:25 AM DeAngela L wrote: Medication: citalopram  (CELEXA ) 20 MG tablet  Has the patient contacted their pharmacy? No  (Agent: If no, request that the patient contact the pharmacy for the refill. If patient does not wish to contact the pharmacy document the reason why and proceed with request.) (Agent: If yes, when and what did the pharmacy advise?)  This is the patient's preferred pharmacy:  [id number] [place], [place] - [address] AT [facility] OF [address] [place] [address] [place] [address] Phone: [phone] Fax: [phone]  Is this the correct pharmacy for this prescription? Yes  If no, delete pharmacy and type the correct one.   Has the prescription been filled recently? Yes   Is the patient out of the medication? Yes   Has the patient been seen for an appointment in the last year OR does the patient have an upcoming appointment? Yes   Can we respond through MyChart?  No   Agent: Please be advised that Rx refills may take up to 3 business days. We ask that you follow-up with your pharmacy.

## 2023-12-21 NOTE — Telephone Encounter (Signed)
 Called w/no answer. Refill sent 12/06/23 for a 90-day supply  Sent to pharmacy as: citalopram  (CELEXA ) 20 MG tablet   E-Prescribing Status: Receipt confirmed by pharmacy (12/06/2023  8:00 AM EDT)

## 2024-01-02 ENCOUNTER — Other Ambulatory Visit: Payer: Self-pay | Admitting: Family Medicine

## 2024-01-02 DIAGNOSIS — I10 Essential (primary) hypertension: Secondary | ICD-10-CM

## 2024-01-04 NOTE — Telephone Encounter (Signed)
 Requested Prescriptions  Pending Prescriptions Disp Refills   losartan  (COZAAR ) 50 MG tablet [Pharmacy Med Name: LOSARTAN  50MG  TABLETS] 90 tablet 0    Sig: TAKE 1 TABLET(50 MG) BY MOUTH DAILY     Cardiovascular:  Angiotensin Receptor Blockers Failed - 01/04/2024 11:18 AM      Failed - Cr in normal range and within 180 days    Creat  Date Value Ref Range Status  02/15/2023 0.91 0.60 - 1.00 mg/dL Final   Creatinine, Urine  Date Value Ref Range Status  02/25/2023 30 20 - 275 mg/dL Final         Failed - K in normal range and within 180 days    Potassium  Date Value Ref Range Status  02/15/2023 4.4 3.5 - 5.3 mmol/L Final         Passed - Patient is not pregnant      Passed - Last BP in normal range    BP Readings from Last 1 Encounters:  07/29/23 128/74         Passed - Valid encounter within last 6 months    Recent Outpatient Visits           5 months ago Hypertension, benign   [facility] Leavy Mole, PA-C

## 2024-01-27 ENCOUNTER — Ambulatory Visit: Admitting: Family Medicine

## 2024-02-02 ENCOUNTER — Ambulatory Visit: Admitting: Internal Medicine

## 2024-02-10 ENCOUNTER — Other Ambulatory Visit: Payer: Self-pay

## 2024-02-10 ENCOUNTER — Encounter: Payer: Self-pay | Admitting: Internal Medicine

## 2024-02-10 ENCOUNTER — Ambulatory Visit (INDEPENDENT_AMBULATORY_CARE_PROVIDER_SITE_OTHER): Admitting: Internal Medicine

## 2024-02-10 VITALS — BP 138/76 | HR 74 | Temp 98.7°F | Resp 16 | Ht 66.0 in | Wt 225.9 lb

## 2024-02-10 DIAGNOSIS — F0280 Dementia in other diseases classified elsewhere without behavioral disturbance: Secondary | ICD-10-CM

## 2024-02-10 DIAGNOSIS — G309 Alzheimer's disease, unspecified: Secondary | ICD-10-CM

## 2024-02-10 DIAGNOSIS — I10 Essential (primary) hypertension: Secondary | ICD-10-CM

## 2024-02-10 DIAGNOSIS — N3281 Overactive bladder: Secondary | ICD-10-CM

## 2024-02-10 DIAGNOSIS — F419 Anxiety disorder, unspecified: Secondary | ICD-10-CM

## 2024-02-10 DIAGNOSIS — E119 Type 2 diabetes mellitus without complications: Secondary | ICD-10-CM

## 2024-02-10 DIAGNOSIS — Z23 Encounter for immunization: Secondary | ICD-10-CM

## 2024-02-10 DIAGNOSIS — F0150 Vascular dementia without behavioral disturbance: Secondary | ICD-10-CM

## 2024-02-10 DIAGNOSIS — K219 Gastro-esophageal reflux disease without esophagitis: Secondary | ICD-10-CM | POA: Diagnosis not present

## 2024-02-10 DIAGNOSIS — J302 Other seasonal allergic rhinitis: Secondary | ICD-10-CM

## 2024-02-10 DIAGNOSIS — F320 Major depressive disorder, single episode, mild: Secondary | ICD-10-CM

## 2024-02-10 DIAGNOSIS — E039 Hypothyroidism, unspecified: Secondary | ICD-10-CM

## 2024-02-10 DIAGNOSIS — F17200 Nicotine dependence, unspecified, uncomplicated: Secondary | ICD-10-CM

## 2024-02-10 DIAGNOSIS — E785 Hyperlipidemia, unspecified: Secondary | ICD-10-CM | POA: Diagnosis not present

## 2024-02-10 LAB — POCT GLYCOSYLATED HEMOGLOBIN (HGB A1C): Hemoglobin A1C: 6.2 % — AB (ref 4.0–5.6)

## 2024-02-10 MED ORDER — CITALOPRAM HYDROBROMIDE 20 MG PO TABS: 20.0000 mg | ORAL_TABLET | Freq: Every day | ORAL | 1 refills | Status: DC

## 2024-02-10 MED ORDER — DONEPEZIL HCL 10 MG PO TABS: ORAL_TABLET | ORAL | 1 refills | Status: AC

## 2024-02-10 MED ORDER — ATORVASTATIN CALCIUM 40 MG PO TABS: 40.0000 mg | ORAL_TABLET | Freq: Every day | ORAL | 1 refills | Status: AC

## 2024-02-10 MED ORDER — LEVOCETIRIZINE DIHYDROCHLORIDE 5 MG PO TABS: 5.0000 mg | ORAL_TABLET | Freq: Every evening | ORAL | 1 refills | Status: AC

## 2024-02-10 MED ORDER — LEVOTHYROXINE SODIUM 75 MCG PO TABS
ORAL_TABLET | ORAL | 1 refills | Status: AC
Start: 2024-02-10 — End: ?

## 2024-02-10 MED ORDER — OXYBUTYNIN CHLORIDE ER 10 MG PO TB24: ORAL_TABLET | ORAL | 1 refills | Status: AC

## 2024-02-10 MED ORDER — METFORMIN HCL 500 MG PO TABS: ORAL_TABLET | ORAL | 1 refills | Status: AC

## 2024-02-10 MED ORDER — LOSARTAN POTASSIUM 50 MG PO TABS: 50.0000 mg | ORAL_TABLET | Freq: Every day | ORAL | 1 refills | Status: AC

## 2024-02-10 MED ORDER — BUPROPION HCL 75 MG PO TABS
ORAL_TABLET | ORAL | 1 refills | Status: AC
Start: 2024-02-10 — End: ?

## 2024-02-10 MED ORDER — PANTOPRAZOLE SODIUM 20 MG PO TBEC: 20.0000 mg | DELAYED_RELEASE_TABLET | Freq: Every day | ORAL | 1 refills | Status: AC

## 2024-02-10 NOTE — Progress Notes (Signed)
 Established Patient Office Visit  Subjective   Patient ID: Katherine Stevens, female    DOB: [birth year]  Age: 78 y.o. MRN: [id number]  Chief Complaint  Patient presents with   Medical Management of Chronic Issues    6 month recheck    HPI  Patient is here for follow up on chronic medical conditions. This is my first time meeting her.   Discussed the use of AI scribe software for clinical note transcription with the patient, who gave verbal consent to proceed.  History of Present Illness Katherine Stevens is a 78 year old female who presents for a routine checkup and medication review.  She has been out of metformin  for over a week. Her diabetes is managed with metformin  alone, with a last A1c of 6.2. She experiences no gastrointestinal side effects from metformin .  She takes blood pressure medication daily and atorvastatin  40 mg daily, with a last LDL of 41.  She takes levothyroxine  75 mcg in the morning before meals. A recent lab from June showed a high TSH.  She takes citalopram  20 mg and Wellbutrin  twice daily, which help with depression and anxiety. She reports occasional depression but generally well-controlled mood. Seroquel  is rarely used.  She experiences occasional dizziness when looking up or standing, which started a couple of weeks ago. No recent falls, but she has experienced falls in the past. No ear pain or pressure changes.  She expresses concern about fatigue and low energy, despite normal B12 levels checked in June.   Hypertension: -Medications: Losartan  50 mg -Patient is compliant with above medications and reports no side effects. -Denies any SOB, CP, vision changes, LE edema or symptoms of hypotension  HLD: -Medications: Lipitor 40 mg -Patient is compliant with above medications and reports no side effects.  -Last lipid panel: Lipid Panel     Component Value Date/Time   CHOL 117 02/15/2023 1406   TRIG 80 02/15/2023 1406   HDL 60 02/15/2023 1406    CHOLHDL 2.0 02/15/2023 1406   LDLCALC 41 02/15/2023 1406   Diabetes, Type 2: -Last A1c 6.7% 11/24 -Medications: Metformin  500 mg daily -Patient is compliant with the above medications and reports no side effects.  -Eye exam: Due -Foot exam: Due today -Microalbumin: Due today (wants to wait for next time) -Statin: yes -PNA vaccine: Prevnar 13 in 2015  -Denies symptoms of hypoglycemia, polyuria, polydipsia, numbness extremities, foot ulcers/trauma.   Hypothyroidism: -Medications: Levothyroxine  75 mcg -Patient is compliant with the above medication (s) at the above dose and reports no medication side effects.  -Denies weight changes, cold./heat intolerance, skin changes, anxiety/palpitations  -Last TSH: 7.530 5/25  MDD: -Mood status: stable -Current treatment: Celexa  20 mg, Wellbutrin  75 mg, Aricept  10 mg, Seroquel  12.5-25 mg at bedtime -Satisfied with current treatment?: yes -Duration of current treatment : chronic -Side effects: no Medication compliance: excellent compliance  GERD: -Currently taking Protonix  20 mg, symptoms well controlled but has to take daily   OAB: -Currently taking Oxybutynin  10 mg, doing well no overactive bladder symptoms or UTI symptoms  Health Maintenance: -Blood work UTD -Patient declines all cancer screenings -Flu vaccine given today  Patient Active Problem List   Diagnosis Date Noted   Stage 3a chronic kidney disease (HCC) 05/17/2022   B12 deficiency 05/17/2022   Vitamin D  deficiency 11/20/2020   Insomnia due to other mental disorder 05/01/2020   OAB (overactive bladder) 05/01/2020   Osteopenia 05/01/2020   History of hepatitis C 05/01/2020   Abnormality of gait and mobility  05/01/2020   Current mild episode of major depressive disorder 04/16/2019   Type 2 diabetes mellitus without complication, without long-term current use of insulin (HCC) 04/16/2019   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/16/2019   Folate deficiency  04/16/2019   Vitamin B1 deficiency 04/16/2019   GERD (gastroesophageal reflux disease) 11/22/2018   Tobacco dependence 12/12/2015   Hyperlipidemia 12/10/2014   Osteoarthritis of both knees 08/01/2012   Seasonal allergies 07/28/2012   Class 2 severe obesity with serious comorbidity and body mass index (BMI) of 37.0 to 37.9 in adult 07/29/2011   Hypothyroidism 12/20/2008   Hypertension, benign 12/20/2008   Past Medical History:  Diagnosis Date   B12 deficiency    Chronic hepatitis C without hepatic 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   Depression    Diabetes mellitus without complication (HCC)    Hyperlipidemia    Hypertension    Memory loss    Overactive bladder    Thyroid  disease    Vertigo 04/16/2019   Past Surgical History:  Procedure Laterality Date   ABDOMINAL HYSTERECTOMY     Social History   Tobacco Use   Smoking status: Every Day    Current packs/day: 0.20    Types: Cigarettes   Smokeless tobacco: Never   Tobacco comments:    Cut back to 4 cig a day   Vaping Use   Vaping status: Never Used  Substance Use Topics   Alcohol use: Not Currently   Drug use: Not Currently   Social History   Socioeconomic History   Marital status: Divorced    Spouse name: Not on file   Number of children: 2   Years of education: Not on file   Highest education level: GED or equivalent  Occupational History   Not on file  Tobacco Use   Smoking status: Every Day    Current packs/day: 0.20    Types: Cigarettes   Smokeless tobacco: Never   Tobacco comments:    Cut back to 4 cig a day   Vaping Use   Vaping status: Never Used  Substance and Sexual Activity   Alcohol use: Not Currently   Drug use: Not Currently   Sexual activity: Not Currently  Other Topics Concern   Not on file  Social History Narrative   Pt lives alone   Social Drivers of Health   Financial Resource Strain: Low Risk  (07/28/2023)   Overall Financial Resource Strain (CARDIA)    Difficulty of Paying  Living Expenses: Not very hard  Food Insecurity: No Food Insecurity (07/28/2023)   Hunger Vital Sign    Worried About Running Out of Food in the Last Year: Never true    Ran Out of Food in the Last Year: Never true  Transportation Needs: No Transportation Needs (07/28/2023)   PRAPARE - Administrator, Civil Service (Medical): No    Lack of Transportation (Non-Medical): No  Physical Activity: Inactive (07/28/2023)   Exercise Vital Sign    Days of Exercise per Week: 0 days    Minutes of Exercise per Session: 0 min  Stress: No Stress Concern Present (07/28/2023)   [employer] of Occupational Health - Occupational Stress Questionnaire    Feeling of Stress : Only a little  Social Connections: Moderately Integrated (07/28/2023)   Social Connection and Isolation Panel    Frequency of Communication with Friends and Family: More than three times a week    Frequency of Social Gatherings with Friends and Family: More than  three times a week    Attends Religious Services: 1 to 4 times per year    Active Member of Clubs or Organizations: Yes    Attends Banker Meetings: 1 to 4 times per year    Marital Status: Divorced  Intimate Partner Violence: Not At Risk (07/28/2023)   Humiliation, Afraid, Rape, and Kick questionnaire    Fear of Current or Ex-Partner: No    Emotionally Abused: No    Physically Abused: No    Sexually Abused: No   Family Status  Relation Name Status   Mother CHF Deceased   Father  Deceased   Sister  (Not Specified)   Brother Older brother Deceased   MGM  Deceased   MGF  Deceased   PGM  Deceased   PGF  Deceased   Brother  Deceased  No partnership data on file   Family History  Problem Relation Age of Onset   Heart failure Mother    Diabetes Mother    Diabetes Sister    Hyperlipidemia Sister    Thyroid  disease Sister    Stroke Brother    Heart attack Brother    Cancer Maternal Grandmother    Heart attack Brother    Allergies   Allergen Reactions   Lisinopril Anaphylaxis    MOUTH & JAW SWELLING      Review of Systems  All other systems reviewed and are negative.     Objective:     BP 138/76 (Cuff Size: Large)   Pulse 74   Temp 98.7 F (37.1 C) (Oral)   Resp 16   Ht 5' 6 (1.676 m)   Wt 225 lb 14.4 oz (102.5 kg)   SpO2 99%   BMI 36.46 kg/m  BP Readings from Last 3 Encounters:  02/10/24 138/76  07/29/23 128/74  04/05/23 122/74   Wt Readings from Last 3 Encounters:  02/10/24 225 lb 14.4 oz (102.5 kg)  07/29/23 226 lb 9.6 oz (102.8 kg)  07/28/23 230 lb (104.3 kg)      Physical Exam Constitutional:      Appearance: Normal appearance.  HENT:     Head: Normocephalic and atraumatic.     Right Ear: Tympanic membrane, ear canal and external ear normal.     Left Ear: Tympanic membrane, ear canal and external ear normal.  Eyes:     Conjunctiva/sclera: Conjunctivae normal.  Cardiovascular:     Rate and Rhythm: Normal rate and regular rhythm.     Pulses:          Dorsalis pedis pulses are 2+ on the right side and 2+ on the left side.  Pulmonary:     Effort: Pulmonary effort is normal.     Breath sounds: Normal breath sounds.  Musculoskeletal:     Right lower leg: No edema.     Left lower leg: No edema.     Right foot: Normal range of motion. No deformity, bunion, Charcot foot, foot drop or prominent metatarsal heads.     Left foot: Normal range of motion. No deformity, bunion, Charcot foot, foot drop or prominent metatarsal heads.  Feet:     Right foot:     Protective Sensation: 6 sites tested.  6 sites sensed.     Skin integrity: Skin integrity normal.     Toenail Condition: Right toenails are normal.     Left foot:     Protective Sensation: 6 sites tested.  6 sites sensed.     Skin integrity: Skin integrity  normal.     Toenail Condition: Left toenails are normal.  Skin:    General: Skin is warm and dry.  Neurological:     General: No focal deficit present.     Mental Status: She  is alert. Mental status is at baseline.  Psychiatric:        Mood and Affect: Mood normal.        Behavior: Behavior normal.      No results found for any visits on 02/10/24.  Last CBC Lab Results  Component Value Date   WBC 9.5 02/15/2023   HGB 12.5 02/15/2023   HCT 38.4 02/15/2023   MCV 87.9 02/15/2023   MCH 28.6 02/15/2023   RDW 13.1 02/15/2023   PLT 274 02/15/2023   Last metabolic panel Lab Results  Component Value Date   GLUCOSE 119 (H) 02/15/2023   NA 140 02/15/2023   K 4.4 02/15/2023   CL 106 02/15/2023   CO2 26 02/15/2023   BUN 10 02/15/2023   CREATININE 0.91 02/15/2023   EGFR 65 02/15/2023   CALCIUM  9.2 02/15/2023   PROT 7.7 02/15/2023   BILITOT 0.3 02/15/2023   AST 19 02/15/2023   ALT 12 02/15/2023   ANIONGAP 12 04/20/2019   Last lipids Lab Results  Component Value Date   CHOL 117 02/15/2023   HDL 60 02/15/2023   LDLCALC 41 02/15/2023   TRIG 80 02/15/2023   CHOLHDL 2.0 02/15/2023   Last hemoglobin A1c Lab Results  Component Value Date   HGBA1C 5.9 (A) 07/29/2023   Last thyroid  functions Lab Results  Component Value Date   TSH 0.88 02/15/2023   T4TOTAL 6.2 02/15/2023   Last vitamin D  Lab Results  Component Value Date   VD25OH 37 11/15/2022   Last vitamin B12 and Folate Lab Results  Component Value Date   VITAMINB12 383 05/17/2022      The ASCVD Risk score (Arnett DK, et al., 2019) failed to calculate for the following reasons:   The valid total cholesterol range is 130 to 320 mg/dL    Assessment & Plan:   Assessment & Plan Type 2 diabetes mellitus Well-controlled with A1c of 6.2, indicating prediabetic range. No complications noted. - Refilled metformin  prescription. - Conducted foot exam to check for neuropathy.  Essential hypertension Well-controlled with current medication regimen. Blood pressure 138/76 mmHg. - Refilled blood pressure medication with 90-day supply and one refill.  Hyperlipidemia Well-managed with  atorvastatin . LDL cholesterol 41 mg/dL, indicating excellent control. - Refilled atorvastatin  prescription.  Gastroesophageal reflux disease Managed with pantoprazole . Reports taking it daily with good effect. - Refilled pantoprazole  prescription.  Overactive bladder Managed with current medication. No incontinence or urinary tract infection symptoms reported. - Refilled bladder medication.  Hypothyroidism Managed with levothyroxine . Recent TSH slightly elevated but not concerning given age. No changes needed. - Refilled levothyroxine  prescription.  Seasonal allergic rhinitis Managed with Xyzal . Reports taking it daily, primarily during spring. - Refilled Xyzal  prescription.  Depression and anxiety disorder Managed with citalopram  and Wellbutrin . Reports feeling well-controlled with current regimen. - Continue current regimen of citalopram  and Wellbutrin .  Memory Changes Managed with medication to slow memory progression. On a stable regimen. - Continue current medication regimen.  General Health Maintenance Discussed flu vaccination. She agreed to receive the flu vaccine after discussion of benefits and potential side effects. - Administered flu vaccine.  - POCT HgB A1C - HM Diabetes Foot Exam - metFORMIN  (GLUCOPHAGE ) 500 MG tablet; TAKE 1 TABLET(500 MG) BY MOUTH DAILY WITH BREAKFAST  Dispense: 90 tablet; Refill: 1 - losartan  (COZAAR ) 50 MG tablet; Take 1 tablet (50 mg total) by mouth daily.  Dispense: 90 tablet; Refill: 1 - atorvastatin  (LIPITOR) 40 MG tablet; Take 1 tablet (40 mg total) by mouth daily.  Dispense: 90 tablet; Refill: 1 - pantoprazole  (PROTONIX ) 20 MG tablet; Take 1 tablet (20 mg total) by mouth daily.  Dispense: 90 tablet; Refill: 1 - oxybutynin  (DITROPAN -XL) 10 MG 24 hr tablet; TAKE 1 TABLET(5 MG) BY MOUTH DAILY FOR OVERACTIVE BLADDER  Dispense: 90 tablet; Refill: 1 - levothyroxine  (SYNTHROID ) 75 MCG tablet; TAKE 1 TABLET(75 MCG) BY MOUTH DAILY BEFORE  BREAKFAST  Dispense: 90 tablet; Refill: 1 - levocetirizine (XYZAL ) 5 MG tablet; Take 1 tablet (5 mg total) by mouth every evening.  Dispense: 90 tablet; Refill: 1 - citalopram  (CELEXA ) 20 MG tablet; Take 1 tablet (20 mg total) by mouth daily.  Dispense: 90 tablet; Refill: 1 - buPROPion  (WELLBUTRIN ) 75 MG tablet; TAKE 1 TABLET BY MOUTH WITH BREAKFAST AND WITH LUNCH FOR MOOD OR SMOKING CESSATION.  Dispense: 180 tablet; Refill: 1 - donepezil  (ARICEPT ) 10 MG tablet; TAKE 1 TABLET(10 MG) BY MOUTH AT BEDTIME  Dispense: 90 tablet; Refill: 1 - Flu vaccine HIGH DOSE PF(Fluzone Trivalent)   Return in about 6 months (around 08/09/2024).    Sharyle Fischer, DO

## 2024-04-02 ENCOUNTER — Other Ambulatory Visit: Payer: Self-pay | Admitting: Internal Medicine

## 2024-04-02 DIAGNOSIS — F419 Anxiety disorder, unspecified: Secondary | ICD-10-CM

## 2024-04-02 DIAGNOSIS — F320 Major depressive disorder, single episode, mild: Secondary | ICD-10-CM

## 2024-04-02 NOTE — Telephone Encounter (Unsigned)
 Copied from CRM #[id number]. Topic: Clinical - Medication Refill >> Apr 02, 2024 11:18 AM Tiffini S wrote: Medication: citalopram  (CELEXA ) 20 MG tablet   Has the patient contacted their pharmacy? Yes (Agent: If no, request that the patient contact the pharmacy for the refill. If patient does not wish to contact the pharmacy document the reason why and proceed with request.) (Agent: If yes, when and what did the pharmacy advise?)  This is the patient's preferred pharmacy:  [id number] [place], [place] - [address] AT [facility] OF [address] [place] [address] [place] [address] Phone: [phone] Fax: [phone]  Is this the correct pharmacy for this prescription? Yes If no, delete pharmacy and type the correct one.   Has the prescription been filled recently? Yes  Is the patient out of the medication? Yes  Has the patient been seen for an appointment in the last year OR does the patient have an upcoming appointment? Yes  Can we respond through MyChart? No, please call [phone]  Agent: Please be advised that Rx refills may take up to 3 business days. We ask that you follow-up with your pharmacy.

## 2024-04-03 MED ORDER — CITALOPRAM HYDROBROMIDE 20 MG PO TABS: 20.0000 mg | ORAL_TABLET | Freq: Every day | ORAL | 1 refills | Status: DC

## 2024-04-03 NOTE — Telephone Encounter (Signed)
 Requested Prescriptions  Pending Prescriptions Disp Refills   citalopram  (CELEXA ) 20 MG tablet 90 tablet 1    Sig: Take 1 tablet (20 mg total) by mouth daily.     Psychiatry:  Antidepressants - SSRI Passed - 04/03/2024  3:50 PM      Passed - Completed PHQ-2 or PHQ-9 in the last 360 days      Passed - Valid encounter within last 6 months    Recent Outpatient Visits           1 month ago Type 2 diabetes mellitus without complication, without long-term current use of insulin [facility])   [facility] Bernardo Fend, DO   8 months ago Hypertension, benign   [facility] Leavy Mole, PA-C

## 2024-05-11 ENCOUNTER — Encounter: Payer: Self-pay | Admitting: Internal Medicine

## 2024-05-11 ENCOUNTER — Ambulatory Visit: Admitting: Internal Medicine

## 2024-05-11 ENCOUNTER — Other Ambulatory Visit: Payer: Self-pay

## 2024-05-11 VITALS — BP 130/84 | HR 71 | Temp 98.3°F | Resp 16 | Ht 66.0 in | Wt 222.6 lb

## 2024-05-11 DIAGNOSIS — F320 Major depressive disorder, single episode, mild: Secondary | ICD-10-CM

## 2024-05-11 DIAGNOSIS — R413 Other amnesia: Secondary | ICD-10-CM

## 2024-05-11 DIAGNOSIS — F419 Anxiety disorder, unspecified: Secondary | ICD-10-CM

## 2024-05-11 MED ORDER — CITALOPRAM HYDROBROMIDE 40 MG PO TABS: 40.0000 mg | ORAL_TABLET | Freq: Every day | ORAL | 0 refills | Status: AC

## 2024-05-11 NOTE — Progress Notes (Signed)
" ° °  Acute Office Visit  Subjective:     Patient ID: Katherine Stevens, female    DOB: [birth year], 79 y.o.   MRN: [id number]  Chief Complaint  Patient presents with   Anxiety    Getting worse    Anxiety Symptoms include insomnia and nervous/anxious behavior.     Patient is in today for anxiety.   Discussed the use of AI scribe software for clinical note transcription with the patient, who gave verbal consent to proceed.  History of Present Illness Katherine Stevens is a 79 year old female with anxiety who presents with worsening symptoms.  Her anxiety has been worsening over time, and citalopram  20 mg daily with Wellbutrin  75 mg twice daily no longer control her symptoms. She has frequent toe twitching and lip smacking that are excessive and cause headaches. She has significant insomnia from anxiety, often awake until 4-5 AM unless she takes a small dose of Seroquel , which improves sleep. Her appetite is decreased and she has lost about three pounds since her last visit. She has lived with a friend for the past three months, which she finds supportive. Current relevant medications include citalopram , Wellbutrin , and Seroquel  as needed for sleep.   Review of Systems  Psychiatric/Behavioral:  The patient is nervous/anxious and has insomnia.         Objective:    BP 130/84 (Cuff Size: Large)   Pulse 71   Temp 98.3 F (36.8 C) (Oral)   Resp 16   Ht 5' 6 (1.676 m)   Wt 222 lb 9.6 oz (101 kg)   SpO2 99%   BMI 35.93 kg/m  BP Readings from Last 3 Encounters:  05/11/24 130/84  02/10/24 138/76  07/29/23 128/74   Wt Readings from Last 3 Encounters:  05/11/24 222 lb 9.6 oz (101 kg)  02/10/24 225 lb 14.4 oz (102.5 kg)  07/29/23 226 lb 9.6 oz (102.8 kg)      Physical Exam Constitutional:      Appearance: Normal appearance.  HENT:     Head: Normocephalic and atraumatic.  Eyes:     Conjunctiva/sclera: Conjunctivae normal.  Cardiovascular:     Rate and Rhythm: Normal  rate and regular rhythm.  Pulmonary:     Effort: Pulmonary effort is normal.     Breath sounds: Normal breath sounds.  Skin:    General: Skin is warm and dry.  Neurological:     Mental Status: She is alert. Mental status is at baseline.  Psychiatric:        Mood and Affect: Mood normal.        Behavior: Behavior normal.     No results found for any visits on 05/11/24.      Assessment & Plan:   Assessment & Plan Anxiety and depression Current treatment with Citalopram  and Bupropion  is ineffective. Weight loss noted. - Increased Citalopram  to 40 mg daily. Instructed to take two 20 mg pills until new prescription is filled. - Continue Bupropion  75 mg bid. - Continue Seroquel  as needed for sleep. - Reassess anxiety and depression symptoms at next appointment in May. - Order labs at next appointment to rule out other contributing factors.  Memory changes Donepezil  is prescribed by neurologist for memory changes. - Continue Donepezil  as prescribed by neurologist.  - citalopram  (CELEXA ) 40 MG tablet; Take 1 tablet (40 mg total) by mouth daily.  Dispense: 90 tablet; Refill: 0   Return for already scheduled.  Sharyle Fischer, DO   "

## 2024-05-11 NOTE — Patient Instructions (Signed)
 It was great seeing you today!  Plan discussed at today's visit: -Increase Celexa  to 40 mg daily - ok to take 2 pills of the 20 mg until new prescription -Please bring medications to next visit and plan for blood work then as well   Follow up in: already scheduled   Take care and let us  know if you have any questions or concerns prior to your next visit.  Dr. Bernardo

## 2024-07-30 ENCOUNTER — Ambulatory Visit

## 2024-08-03 ENCOUNTER — Ambulatory Visit

## 2024-08-09 ENCOUNTER — Ambulatory Visit: Admitting: Internal Medicine

## 2024-08-09 ENCOUNTER — Encounter: Admitting: Internal Medicine
